# Patient Record
Sex: Female | Born: 1995 | Race: Black or African American | Hispanic: No | Marital: Married | Smoking: Never smoker
Health system: Southern US, Community
[De-identification: ages and names within clinical notes are randomized; demographics above are authoritative.]

## PROBLEM LIST (undated history)

## (undated) ENCOUNTER — Inpatient Hospital Stay (HOSPITAL_COMMUNITY): Payer: Self-pay

## (undated) DIAGNOSIS — A599 Trichomoniasis, unspecified: Secondary | ICD-10-CM

## (undated) DIAGNOSIS — D649 Anemia, unspecified: Secondary | ICD-10-CM

## (undated) DIAGNOSIS — K219 Gastro-esophageal reflux disease without esophagitis: Secondary | ICD-10-CM

## (undated) HISTORY — PX: NO PAST SURGERIES: SHX2092

---
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
## 2017-01-04 ENCOUNTER — Encounter (HOSPITAL_COMMUNITY): Payer: Self-pay | Admitting: Family Medicine

## 2017-01-04 ENCOUNTER — Ambulatory Visit (HOSPITAL_COMMUNITY)
Admission: EM | Admit: 2017-01-04 | Discharge: 2017-01-04 | Disposition: A | Discharge status: Home / Self Care | Payer: Medicaid Other | Attending: Internal Medicine | Admitting: Internal Medicine

## 2017-01-04 DIAGNOSIS — Z3202 Encounter for pregnancy test, result negative: Secondary | ICD-10-CM

## 2017-01-04 DIAGNOSIS — R4589 Other symptoms and signs involving emotional state: Secondary | ICD-10-CM

## 2017-01-04 DIAGNOSIS — F489 Nonpsychotic mental disorder, unspecified: Secondary | ICD-10-CM

## 2017-01-04 LAB — POCT PREGNANCY, URINE: Preg Test, Ur: NEGATIVE

## 2017-01-04 NOTE — ED Provider Notes (Signed)
CSN: [id number]     Arrival date & time 01/04/17  1322 History   None    Chief Complaint  Patient presents with  . Possible Pregnancy   (Consider location/radiation/quality/duration/timing/severity/associated sxs/prior Treatment) 22 year old female returns to the urgent care 7 days after she had presented to the urgent care for her pregnancy test. She is requesting another pregnancy test. The first one was negative. The chief complaint was moodiness according to her significant other. The patient talks very little. Her significant other encouraged her to be seen again in the urgent care. No missed periods. LMP 12/17/2016. No bleeding or discharge. No urinary symptoms.      History reviewed. No pertinent past medical history. History reviewed. No pertinent surgical history. History reviewed. No pertinent family history. Social History  Substance Use Topics  . Smoking status: Never Smoker  . Smokeless tobacco: Never Used  . Alcohol use No   OB History    No data available     Review of Systems  Constitutional: Negative.   Respiratory: Negative.   Gastrointestinal: Negative.   Genitourinary: Negative for dysuria, frequency, urgency, vaginal bleeding and vaginal discharge.  Musculoskeletal: Negative.   Neurological: Positive for dizziness.  Psychiatric/Behavioral: Positive for dysphoric mood.  All other systems reviewed and are negative.   Allergies  Patient has no known allergies.  Home Medications   Prior to Admission medications   Medication Sig Start Date End Date Taking? Authorizing Provider  omeprazole (PRILOSEC) 20 MG capsule Take 20 mg by mouth daily.    Historical Provider, MD   Meds Ordered and Administered this Visit  Medications - No data to display  BP (!) 112/59   Pulse (!) 111   Temp 98.3 F (36.8 C)   Resp 18   LMP 12/19/2016 (Exact Date)   SpO2 100%  No data found.   Physical Exam  Constitutional: She is oriented to person, place, and time. She  appears well-developed and well-nourished. No distress.  HENT:  Right Ear: External ear normal.  Left Ear: External ear normal.  Posterior pharynx with minimal erythema, light cobblestoning and scant clear PND.  Eyes: EOM are normal.  Neck: Normal range of motion. Neck supple.  Cardiovascular: Normal rate, regular rhythm, normal heart sounds and intact distal pulses.   Pulmonary/Chest: Effort normal and breath sounds normal. No respiratory distress. She has no wheezes. She has no rales.  Abdominal: Soft. She exhibits no distension and no mass. There is no tenderness. There is no rebound and no guarding.  Musculoskeletal: She exhibits no edema.  Lymphadenopathy:    She has no cervical adenopathy.  Neurological: She is alert and oriented to person, place, and time. She exhibits normal muscle tone.  Skin: Skin is warm and dry.  Psychiatric: She has a normal mood and affect.  Nursing note and vitals reviewed.   Urgent Care Course     Procedures (including critical care time)  Labs Review Labs Reviewed  POCT PREGNANCY, URINE    Imaging Review No results found.   Visual Acuity Review  Right Eye Distance:   Left Eye Distance:   Bilateral Distance:    Right Eye Near:   Left Eye Near:    Bilateral Near:         MDM   1. Moodiness   2. Negative pregnancy test    You pregnancy test is negative. If you are having any type of mood disorder you must follow-up with your primary care doctor. If he do not have one  call the telephone numbers listed in your instruction sheets. You may also follow-up with behavioral health clinic in [place] associated with [place]. There also, may be a physical disorder requiring lab work that we do not have here. There is very little the urgent care can do in regards to potential mood disorders and advance lab work. Strongly recommend that you obtain a new primary care provider or see the one that is listed above.    Hayden Rasmussen,  NP 01/04/17 1423

## 2017-01-04 NOTE — ED Triage Notes (Signed)
Pt here for preg test.

## 2017-01-04 NOTE — Discharge Instructions (Signed)
You pregnancy test is negative. If you are having any type of mood disorder you must follow-up with your primary care doctor. If he do not have one call the telephone numbers listed in your instruction sheets. You may also follow-up with behavioral health clinic in [place] associated with [place]. There also, may be a physical disorder requiring lab work that we do not have here. There is very little the urgent care can do in regards to potential mood disorders and advance lab work. Strongly recommend that you obtain a new primary care provider or see the one that is listed above.

## 2017-01-11 ENCOUNTER — Encounter (HOSPITAL_COMMUNITY): Payer: Self-pay | Admitting: Emergency Medicine

## 2017-01-11 ENCOUNTER — Ambulatory Visit (HOSPITAL_COMMUNITY)
Admission: EM | Admit: 2017-01-11 | Discharge: 2017-01-11 | Disposition: A | Discharge status: Home / Self Care | Payer: Medicaid Other | Attending: Family Medicine | Admitting: Family Medicine

## 2017-01-11 DIAGNOSIS — N946 Dysmenorrhea, unspecified: Secondary | ICD-10-CM

## 2017-01-11 DIAGNOSIS — K2100 Gastro-esophageal reflux disease with esophagitis, without bleeding: Secondary | ICD-10-CM

## 2017-01-11 MED ORDER — NAPROXEN 500 MG PO TABS: ORAL_TABLET | ORAL | 0 refills | Status: DC

## 2017-01-11 MED ORDER — OMEPRAZOLE 20 MG PO CPDR: 20.0000 mg | DELAYED_RELEASE_CAPSULE | Freq: Every day | ORAL | 0 refills | Status: DC

## 2017-01-11 NOTE — ED Triage Notes (Signed)
Pt reports lower abdominal pain that started yesterday.

## 2017-01-11 NOTE — ED Provider Notes (Signed)
CSN: [id number]     Arrival date & time 01/11/17  1939 History   First MD Initiated Contact with Patient 01/11/17 1950     Chief Complaint  Patient presents with  . Abdominal Pain   (Consider location/radiation/quality/duration/timing/severity/associated sxs/prior Treatment) Patient c/o menstrual cramping since starting her menses.  She would like refill on prilosec for GERD.   The history is provided by the patient.  Abdominal Pain  Pain location:  Generalized Pain quality: aching   Pain radiates to:  Does not radiate   History reviewed. No pertinent past medical history. History reviewed. No pertinent surgical history. History reviewed. No pertinent family history. Social History  Substance Use Topics  . Smoking status: Never Smoker  . Smokeless tobacco: Never Used  . Alcohol use No   OB History    No data available     Review of Systems  Constitutional: Negative.   HENT: Negative.   Eyes: Negative.   Respiratory: Negative.   Cardiovascular: Negative.   Gastrointestinal: Positive for abdominal pain.  Endocrine: Negative.   Genitourinary: Positive for menstrual problem.  Musculoskeletal: Negative.   Allergic/Immunologic: Negative for immunocompromised state.  Neurological: Negative.   Hematological: Negative.   Psychiatric/Behavioral: Negative.     Allergies  Patient has no known allergies.  Home Medications   Prior to Admission medications   Medication Sig Start Date End Date Taking? Authorizing Provider  naproxen (NAPROSYN) 500 MG tablet Take one po bid x 5 days and start 2 days prior to menses. 01/11/17   Deatra Canter, FNP  omeprazole (PRILOSEC) 20 MG capsule Take 1 capsule (20 mg total) by mouth daily. 01/11/17   Deatra Canter, FNP   Meds Ordered and Administered this Visit  Medications - No data to display  BP 124/76 (BP Location: Right Arm)   Pulse 97   Temp 98.2 F (36.8 C) (Oral)   LMP 01/10/2017   SpO2 98%  No data found.   Physical  Exam  Constitutional: She is oriented to person, place, and time. She appears well-developed and well-nourished.  HENT:  Head: Normocephalic.  Right Ear: External ear normal.  Left Ear: External ear normal.  Mouth/Throat: Oropharynx is clear and moist.  Eyes: Conjunctivae and EOM are normal. Pupils are equal, round, and reactive to light.  Neck: Normal range of motion. Neck supple.  Cardiovascular: Normal rate, regular rhythm and normal heart sounds.   Pulmonary/Chest: Effort normal and breath sounds normal.  Abdominal: Soft. Bowel sounds are normal.  Musculoskeletal: Normal range of motion.  Neurological: She is alert and oriented to person, place, and time.  Nursing note and vitals reviewed.   Urgent Care Course     Procedures (including critical care time)  Labs Review Labs Reviewed - No data to display  Imaging Review No results found.   Visual Acuity Review  Right Eye Distance:   Left Eye Distance:   Bilateral Distance:    Right Eye Near:   Left Eye Near:    Bilateral Near:         MDM   1. Dysmenorrhea   2. Gastroesophageal reflux disease with esophagitis    Prilosec  one po qd #30 Naprosyn  one po bid x 5 days #30      Deatra Canter, FNP 01/11/17 2025

## 2017-01-19 ENCOUNTER — Emergency Department (HOSPITAL_COMMUNITY)
Admission: EM | Admit: 2017-01-19 | Discharge: 2017-01-19 | Disposition: A | Discharge status: Home / Self Care | Payer: Medicaid Other | Attending: Emergency Medicine | Admitting: Emergency Medicine

## 2017-01-19 ENCOUNTER — Encounter (HOSPITAL_COMMUNITY): Payer: Self-pay

## 2017-01-19 DIAGNOSIS — F17200 Nicotine dependence, unspecified, uncomplicated: Secondary | ICD-10-CM | POA: Diagnosis not present

## 2017-01-19 DIAGNOSIS — N390 Urinary tract infection, site not specified: Secondary | ICD-10-CM | POA: Diagnosis present

## 2017-01-19 DIAGNOSIS — R300 Dysuria: Secondary | ICD-10-CM

## 2017-01-19 DIAGNOSIS — N940 Mittelschmerz: Secondary | ICD-10-CM | POA: Diagnosis not present

## 2017-01-19 LAB — URINALYSIS, ROUTINE W REFLEX MICROSCOPIC
BILIRUBIN URINE: NEGATIVE
Bacteria, UA: NONE SEEN
GLUCOSE, UA: NEGATIVE mg/dL
Ketones, ur: NEGATIVE mg/dL
NITRITE: NEGATIVE
PH: 7 (ref 5.0–8.0)
Protein, ur: 30 mg/dL — AB
Specific Gravity, Urine: 1.017 (ref 1.005–1.030)

## 2017-01-19 LAB — WET PREP, GENITAL
Clue Cells Wet Prep HPF POC: NONE SEEN
SPERM: NONE SEEN
TRICH WET PREP: NONE SEEN
Yeast Wet Prep HPF POC: NONE SEEN

## 2017-01-19 LAB — POC URINE PREG, ED: Preg Test, Ur: NEGATIVE

## 2017-01-19 MED ORDER — IBUPROFEN 600 MG PO TABS: 600.0000 mg | ORAL_TABLET | Freq: Four times a day (QID) | ORAL | 0 refills | Status: DC | PRN

## 2017-01-19 MED ORDER — PHENAZOPYRIDINE HCL 200 MG PO TABS: 200.0000 mg | ORAL_TABLET | Freq: Three times a day (TID) | ORAL | 0 refills | Status: DC

## 2017-01-19 NOTE — ED Notes (Addendum)
Dondra Spry, NP at bedside to perfrom Pelvic exam

## 2017-01-19 NOTE — Discharge Instructions (Signed)
Today your examination is normal.  I feel that you are having mittelschmerz name U been given a perception for ibuprofen as well as Pyridium, which will help the discomfort when you urinate.  Please make an appointment with your doctor for follow-up

## 2017-01-19 NOTE — ED Notes (Signed)
Pt requesting preg test with blood draw. Rn asked if pt thinks she is pregnant and pt sts she doesn't know. LMP 01/10/17

## 2017-01-19 NOTE — ED Triage Notes (Signed)
Pt states she believes she has an UTI; pt states new onset today of right lower back pain, burning sensation on urination and  Increased frequency noted; pt states pain 7/10 on arrival. Pt a&ox 4

## 2017-01-19 NOTE — ED Provider Notes (Signed)
[facility]-EMERGENCY DEPT Provider Note   CSN: [id number] Arrival date & time: 01/19/17  1912     History   Chief Complaint Chief Complaint  Patient presents with  . Urinary Tract Infection    HPI Julie [name] is a 21 y.o. female.  This is an 21 year old female who reports, dysuria and right low back pain since this morning.  She's not taken any over-the-counter medication.  She reports that her last menstrual period was 10 days ago, normal.      History reviewed. No pertinent past medical history.  There are no active problems to display for this patient.   History reviewed. No pertinent surgical history.  OB History    No data available       Home Medications    Prior to Admission medications   Medication Sig Start Date End Date Taking? Authorizing Provider  ibuprofen (ADVIL,MOTRIN) 600 MG tablet Take 1 tablet (600 mg total) by mouth every 6 (six) hours as needed. 01/19/17   Earley Favor, NP  naproxen (NAPROSYN) 500 MG tablet Take one po bid x 5 days and start 2 days prior to menses. 01/11/17   Deatra Canter, FNP  omeprazole (PRILOSEC) 20 MG capsule Take 1 capsule (20 mg total) by mouth daily. 01/11/17   Deatra Canter, FNP  phenazopyridine (PYRIDIUM) 200 MG tablet Take 1 tablet (200 mg total) by mouth 3 (three) times daily. 01/19/17   Earley Favor, NP    Family History History reviewed. No pertinent family history.  Social History Social History  Substance Use Topics  . Smoking status: Current Some Day Smoker  . Smokeless tobacco: Never Used  . Alcohol use No     Allergies   Patient has no known allergies.   Review of Systems Review of Systems  Constitutional: Negative for chills and fever.  Gastrointestinal: Negative for abdominal pain.  Genitourinary: Positive for dysuria. Negative for decreased urine volume, frequency, menstrual problem, vaginal bleeding, vaginal discharge and vaginal pain.  Musculoskeletal: Positive for back pain.  Skin:  Negative for wound.  All other systems reviewed and are negative.    Physical Exam Updated Vital Signs BP 103/85   Pulse 73   Temp 97.8 F (36.6 C) (Oral)   Resp 16   Ht  (1.651 m)   Wt 52.2 kg   LMP 01/10/2017   SpO2 100%   BMI 19.14 kg/m   Physical Exam  Constitutional: She appears well-developed and well-nourished.  HENT:  Head: Normocephalic.  Eyes: Pupils are equal, round, and reactive to light.  Neck: Normal range of motion.  Cardiovascular: Normal rate.   Pulmonary/Chest: Effort normal.  Abdominal: Soft. She exhibits no distension. There is no tenderness.  Genitourinary: Vagina normal and uterus normal. Cervix exhibits no discharge. Right adnexum displays no mass, no tenderness and no fullness. Left adnexum displays no mass, no tenderness and no fullness. No vaginal discharge found.  Neurological: She is alert.  Skin: Skin is warm.  Psychiatric: She has a normal mood and affect.  Nursing note and vitals reviewed.    ED Treatments / Results  Labs (all labs ordered are listed, but only abnormal results are displayed) Labs Reviewed  WET PREP, GENITAL - Abnormal; Notable for the following:       Result Value   WBC, Wet Prep HPF POC FEW (*)    All other components within normal limits  URINALYSIS, ROUTINE W REFLEX MICROSCOPIC - Abnormal; Notable for the following:    APPearance HAZY (*)  Hgb urine dipstick MODERATE (*)    Protein, ur 30 (*)    Leukocytes, UA MODERATE (*)    Squamous Epithelial / LPF 0-5 (*)    Non Squamous Epithelial 0-5 (*)    All other components within normal limits  POC URINE PREG, ED  GC/CHLAMYDIA PROBE AMP ([place]) NOT AT [facility]    EKG  EKG Interpretation None       Radiology No results found.  Procedures Procedures (including critical care time)  Medications Ordered in ED Medications - No data to display   Initial Impression / Assessment and Plan / ED Course  I have reviewed the triage vital signs and the  nursing notes.  Pertinent labs & imaging results that were available during my care of the patient were reviewed by me and considered in my medical decision making (see chart for details).      Is negative.  Pelvic exam is benign.  This patient has mittelschmerz.  She'll be given ibuprofen with [facility]  Final Clinical Impressions(s) / ED Diagnoses   Final diagnoses:  Dysuria  Mittelschmerz    New Prescriptions Discharge Medication List as of 01/19/2017 10:21 PM    START taking these medications   Details  ibuprofen (ADVIL,MOTRIN) 600 MG tablet Take 1 tablet (600 mg total) by mouth every 6 (six) hours as needed., Starting Thu 01/19/2017, Print    phenazopyridine (PYRIDIUM) 200 MG tablet Take 1 tablet (200 mg total) by mouth 3 (three) times daily., Starting Thu 01/19/2017, Print         Earley Favor, NP 01/20/17 1610    Bethann Berkshire, MD 01/24/17 2141

## 2017-01-20 LAB — GC/CHLAMYDIA PROBE AMP (~~LOC~~) NOT AT ARMC
Chlamydia: NEGATIVE
Neisseria Gonorrhea: NEGATIVE

## 2017-03-02 ENCOUNTER — Encounter (HOSPITAL_COMMUNITY): Payer: Self-pay | Admitting: Emergency Medicine

## 2017-03-02 ENCOUNTER — Ambulatory Visit (HOSPITAL_COMMUNITY)
Admission: EM | Admit: 2017-03-02 | Discharge: 2017-03-02 | Disposition: A | Discharge status: Home / Self Care | Payer: Medicaid Other | Attending: Internal Medicine | Admitting: Internal Medicine

## 2017-03-02 DIAGNOSIS — O26851 Spotting complicating pregnancy, first trimester: Secondary | ICD-10-CM | POA: Diagnosis not present

## 2017-03-02 DIAGNOSIS — Z3A01 Less than 8 weeks gestation of pregnancy: Secondary | ICD-10-CM

## 2017-03-02 DIAGNOSIS — Z0289 Encounter for other administrative examinations: Secondary | ICD-10-CM | POA: Diagnosis not present

## 2017-03-02 NOTE — ED Triage Notes (Signed)
Abdominal pain onset 2-3 days ago, pain is worse in the morning.  Reports nausea and vomiting intermittently in the morning.    Patient is pregnant as confirmed by the ob/gyn.  Patient saw them last week.  Patient is 7 weeks, 2 days pregnant.    Patient reports spotting today

## 2017-03-02 NOTE — Discharge Instructions (Signed)
If at anytime you have abdominal pain, cramping, or discomfort, or if your spotting returns, [facility]. Follow up with your gynecologist/OB for discussions about work restrictions.

## 2017-03-02 NOTE — ED Provider Notes (Signed)
CSN: [id number]     Arrival date & time 03/02/17  1835 History   First MD Initiated Contact with Patient 03/02/17 1912     Chief Complaint  Patient presents with  . Abdominal Pain   (Consider location/radiation/quality/duration/timing/severity/associated sxs/prior Treatment) 21 year old female presents to clinic for a chief complaint of having some vaginal spotting, and abdominal pain and cramping. She denies having either abdominal pain, or spotting currently at the time of assessment. She states that she has woken up each morning for the last 2-3 days with these symptoms, never resolved throughout the day. She is [redacted] weeks pregnant, does not have an OB yet, however does have an appointment coming up to establish with an OB. She has no nausea, no vomiting, fatigue, or other symptoms, and she does not appear to be in acute distress. This is her second pregnancy, however the first resulted in a spontaneous miscarriage. She is also wanting a work note, to restrict her to less than 34 hours per week of employment.   The history is provided by the patient.    History reviewed. No pertinent past medical history. History reviewed. No pertinent surgical history. No family history on file. Social History  Substance Use Topics  . Smoking status: Former Games developer  . Smokeless tobacco: Never Used  . Alcohol use No   OB History    Gravida Para Term Preterm AB Living   1             SAB TAB Ectopic Multiple Live Births                 Review of Systems  Constitutional: Negative.   HENT: Negative.   Respiratory: Negative.   Cardiovascular: Negative.   Gastrointestinal: Negative.   Genitourinary: Negative.   Musculoskeletal: Negative.   Skin: Negative.   Neurological: Negative.     Allergies  Patient has no known allergies.  Home Medications   Prior to Admission medications   Medication Sig Start Date End Date Taking? Authorizing Provider  ibuprofen (ADVIL,MOTRIN) 600 MG tablet Take 1  tablet (600 mg total) by mouth every 6 (six) hours as needed. 01/19/17   Earley Favor, NP  naproxen (NAPROSYN) 500 MG tablet Take one po bid x 5 days and start 2 days prior to menses. 01/11/17   Deatra Canter, FNP  omeprazole (PRILOSEC) 20 MG capsule Take 1 capsule (20 mg total) by mouth daily. 01/11/17   Deatra Canter, FNP  phenazopyridine (PYRIDIUM) 200 MG tablet Take 1 tablet (200 mg total) by mouth 3 (three) times daily. 01/19/17   Earley Favor, NP   Meds Ordered and Administered this Visit  Medications - No data to display  BP 102/62   Pulse 98   Temp 98.4 F (36.9 C) (Oral)   Resp 18   LMP 01/10/2017   SpO2 100%  No data found.   Physical Exam  Constitutional: She is oriented to person, place, and time. She appears well-developed and well-nourished. No distress.  HENT:  Head: Normocephalic and atraumatic.  Right Ear: External ear normal.  Left Ear: External ear normal.  Eyes: Conjunctivae are normal.  Neck: Normal range of motion.  Cardiovascular: Normal rate and regular rhythm.   Pulmonary/Chest: Effort normal and breath sounds normal.  Abdominal: Soft. Bowel sounds are normal. She exhibits no distension. There is no tenderness. There is no rebound and no guarding.  Neurological: She is alert and oriented to person, place, and time.  Skin: Skin is warm and dry.  Capillary refill takes less than 2 seconds. No rash noted. She is not diaphoretic. No erythema.  Psychiatric: She has a normal mood and affect. Her behavior is normal.  Nursing note and vitals reviewed.   Urgent Care Course     Procedures (including critical care time)  Labs Review Labs Reviewed - No data to display  Imaging Review No results found.           MDM   1. Encounter to obtain excuse from work    Patient denies any complaints at the time of assessment, physical exam findings are unremarkable, strict follow-up guidelines were provided, for the patient any time she has vaginal  bleeding, or abdominal pain, to [facility]. Patient also encouraged to follow up, and establish with her OB, and any discussions regarding work restrictions should be undertaken by her OB. Follow-up as needed.     Dorena BodoKennard, Mikayela Deats, NP 03/02/17 Corky Crafts1955

## 2017-03-29 ENCOUNTER — Encounter: Payer: Medicaid Other | Admitting: Obstetrics and Gynecology

## 2017-04-17 ENCOUNTER — Encounter: Payer: Self-pay | Admitting: Obstetrics & Gynecology

## 2017-04-17 ENCOUNTER — Ambulatory Visit (INDEPENDENT_AMBULATORY_CARE_PROVIDER_SITE_OTHER): Payer: Medicaid Other | Admitting: Obstetrics & Gynecology

## 2017-04-17 ENCOUNTER — Other Ambulatory Visit (HOSPITAL_COMMUNITY)
Admission: RE | Admit: 2017-04-17 | Discharge: 2017-04-17 | Disposition: A | Discharge status: Home / Self Care | Payer: Medicaid Other | Source: Ambulatory Visit | Attending: Obstetrics and Gynecology | Admitting: Obstetrics and Gynecology

## 2017-04-17 VITALS — BP 117/74 | HR 112 | Wt 108.1 lb

## 2017-04-17 DIAGNOSIS — Z3400 Encounter for supervision of normal first pregnancy, unspecified trimester: Secondary | ICD-10-CM | POA: Insufficient documentation

## 2017-04-17 DIAGNOSIS — Z3401 Encounter for supervision of normal first pregnancy, first trimester: Secondary | ICD-10-CM | POA: Diagnosis present

## 2017-04-17 DIAGNOSIS — Z3689 Encounter for other specified antenatal screening: Secondary | ICD-10-CM

## 2017-04-17 DIAGNOSIS — O2341 Unspecified infection of urinary tract in pregnancy, first trimester: Secondary | ICD-10-CM

## 2017-04-17 NOTE — Patient Instructions (Signed)

## 2017-04-17 NOTE — Progress Notes (Signed)
     Subjective:   Julie [name] is a 21 y.o. G1P0 at 2271w6d by LMP being seen today for her first obstetrical visit.  Her obstetrical history is significant for teenage pregnancy. Patient does intend to breast feed. Pregnancy history fully reviewed.  Patient reports no complaints.  HISTORY: Obstetric History   G1   P0   T0   P0   A0   L0    SAB0   TAB0   Ectopic0   Multiple0   Live Births0     # Outcome Date GA Lbr Len/2nd Weight Sex Delivery Anes PTL Lv  1 Current              History reviewed. No pertinent past medical history. History reviewed. No pertinent surgical history. Family History  Problem Relation Age of Onset  . Diabetes Mother   . Pancreatic cancer Father   . Cancer Maternal Grandfather    Social History  Substance Use Topics  . Smoking status: Former Games developermoker  . Smokeless tobacco: Never Used  . Alcohol use No   No Known Allergies No current outpatient prescriptions on file prior to visit.   No current facility-administered medications on file prior to visit.      Exam   Vitals:   04/17/17 0925  BP: 117/74  Pulse: (!) 112  Weight: 108 lb 1.6 oz (49 kg)   Fetal Heart Rate (bpm): 156  Uterus:     Pelvic Exam: Deferred   System: General: well-developed, well-nourished female in no acute distress   Breast:  normal appearance, no masses or tenderness   Skin: normal coloration and turgor, no rashes   Neurologic: oriented, normal, negative, normal mood   Extremities: normal strength, tone, and muscle mass, ROM of all joints is normal   HEENT PERRLA, extraocular movement intact and sclera clear, anicteric   Mouth/Teeth mucous membranes moist, pharynx normal without lesions and dental hygiene good   Neck supple and no masses   Cardiovascular: regular rate and rhythm   Respiratory:  no respiratory distress, normal breath sounds   Abdomen: soft, non-tender; bowel sounds normal; no masses,  no organomegaly     Assessment:   Pregnancy: G1P0 Patient  Active Problem List   Diagnosis Date Noted  . Encounter for supervision of normal first pregnancy, unspecified trimester 04/17/2017     Plan:  1. Encounter for fetal anatomic survey Anatomy scan ordered - US MFM OB COMP + 14 WK; Future  2. Encounter for supervision of normal first pregnancy in first trimester Labs done including NIPS - Hemoglobinopathy evaluation - Obstetric Panel, Including HIV - Cystic Fibrosis Mutation 97 - SMN1 Copy Number Analysis - Culture, OB Urine - Urine cytology ancillary only - MaterniT21  plus Core+ESS+SCA, Blood Continue prenatal vitamins. Genetic Screening discussed, NIPS: ordered. Ultrasound discussed; fetal anatomic survey: ordered. Problem list reviewed and updated. Patient will be enrolled in [facility] The nature of [facility] with multiple MDs and other Advanced Practice Providers was explained to patient; also emphasized that residents, students are part of our team. Routine obstetric precautions reviewed. Return in about 6 weeks (around 05/29/2017) for OB 20 week visit ([facility]).     Jaynie CollinsUGONNA  [name], MD, FACOG Attending Obstetrician & Gynecologist, [facility], [facility]

## 2017-04-18 LAB — URINE CYTOLOGY ANCILLARY ONLY
Chlamydia: NEGATIVE
NEISSERIA GONORRHEA: NEGATIVE
TRICH (WINDOWPATH): NEGATIVE

## 2017-04-20 ENCOUNTER — Encounter: Payer: Self-pay | Admitting: *Deleted

## 2017-04-20 LAB — CULTURE, OB URINE

## 2017-04-20 LAB — URINE CULTURE, OB REFLEX

## 2017-04-20 MED ORDER — NITROFURANTOIN MONOHYD MACRO 100 MG PO CAPS: 100.0000 mg | ORAL_CAPSULE | Freq: Two times a day (BID) | ORAL | 1 refills | Status: DC

## 2017-04-20 NOTE — Addendum Note (Signed)
Addended by: Jaynie CollinsANYANWU, Kingstin Heims A on: 04/20/2017 04:54 PM   Modules accepted: Orders

## 2017-04-22 LAB — MATERNIT21  PLUS CORE+ESS+SCA, BLOOD
CHROMOSOME 13: NEGATIVE
CHROMOSOME 21: NEGATIVE
Chromosome 18: NEGATIVE
Y CHROMOSOME: NOT DETECTED

## 2017-04-26 ENCOUNTER — Encounter: Payer: Self-pay | Admitting: Obstetrics & Gynecology

## 2017-04-26 DIAGNOSIS — O361990 Maternal care for other isoimmunization, unspecified trimester, not applicable or unspecified: Secondary | ICD-10-CM | POA: Insufficient documentation

## 2017-04-26 LAB — SMN1 COPY NUMBER ANALYSIS (SMA CARRIER SCREENING)

## 2017-04-26 LAB — HEMOGLOBINOPATHY EVALUATION
HGB A: 96.2 % — AB (ref 96.4–98.8)
HGB C: 0 %
HGB S: 0 %
HGB VARIANT: 0 %
Hemoglobin A2 Quantitation: 2.9 % (ref 1.8–3.2)
Hemoglobin F Quantitation: 0.9 % (ref 0.0–2.0)

## 2017-04-26 LAB — OBSTETRIC PANEL, INCLUDING HIV
BASOS ABS: 0 10*3/uL (ref 0.0–0.2)
BASOS: 0 %
EOS (ABSOLUTE): 0 10*3/uL (ref 0.0–0.4)
Eos: 0 %
HIV SCREEN 4TH GENERATION: NONREACTIVE
Hematocrit: 32.9 % — ABNORMAL LOW (ref 34.0–46.6)
Hemoglobin: 10.9 g/dL — ABNORMAL LOW (ref 11.1–15.9)
Hepatitis B Surface Ag: NEGATIVE
Immature Grans (Abs): 0 10*3/uL (ref 0.0–0.1)
Immature Granulocytes: 0 %
LYMPHS ABS: 1.3 10*3/uL (ref 0.7–3.1)
Lymphs: 23 %
MCH: 29.5 pg (ref 26.6–33.0)
MCHC: 33.1 g/dL (ref 31.5–35.7)
MCV: 89 fL (ref 79–97)
MONOCYTES: 8 %
MONOS ABS: 0.5 10*3/uL (ref 0.1–0.9)
NEUTROS ABS: 3.9 10*3/uL (ref 1.4–7.0)
Neutrophils: 69 %
Platelets: 279 10*3/uL (ref 150–379)
RBC: 3.7 x10E6/uL — ABNORMAL LOW (ref 3.77–5.28)
RDW: 16 % — AB (ref 12.3–15.4)
RPR Ser Ql: NONREACTIVE
RUBELLA: 3.82 {index} (ref >0.99)
Rh Factor: POSITIVE
WBC: 5.7 10*3/uL (ref 3.4–10.8)

## 2017-04-26 LAB — CYSTIC FIBROSIS MUTATION 97: GENE DIS ANAL CARRIER INTERP BLD/T-IMP: NOT DETECTED

## 2017-04-26 LAB — AB SCR+ANTIBODY ID: ANTIBODY SCREEN: POSITIVE — AB

## 2017-05-20 ENCOUNTER — Encounter (HOSPITAL_COMMUNITY): Payer: Self-pay | Admitting: *Deleted

## 2017-05-20 ENCOUNTER — Inpatient Hospital Stay (HOSPITAL_COMMUNITY)
Admission: AD | Admit: 2017-05-20 | Discharge: 2017-05-20 | Disposition: A | Discharge status: Home / Self Care | Payer: Medicaid Other | Source: Ambulatory Visit | Attending: Obstetrics and Gynecology | Admitting: Obstetrics and Gynecology

## 2017-05-20 DIAGNOSIS — Z3A18 18 weeks gestation of pregnancy: Secondary | ICD-10-CM | POA: Diagnosis not present

## 2017-05-20 DIAGNOSIS — Z87891 Personal history of nicotine dependence: Secondary | ICD-10-CM | POA: Diagnosis not present

## 2017-05-20 DIAGNOSIS — O26892 Other specified pregnancy related conditions, second trimester: Secondary | ICD-10-CM | POA: Insufficient documentation

## 2017-05-20 DIAGNOSIS — R109 Unspecified abdominal pain: Secondary | ICD-10-CM | POA: Diagnosis present

## 2017-05-20 HISTORY — DX: Gastro-esophageal reflux disease without esophagitis: K21.9

## 2017-05-20 LAB — URINALYSIS, ROUTINE W REFLEX MICROSCOPIC
BILIRUBIN URINE: NEGATIVE
GLUCOSE, UA: 50 mg/dL — AB
HGB URINE DIPSTICK: NEGATIVE
Ketones, ur: NEGATIVE mg/dL
NITRITE: NEGATIVE
Protein, ur: NEGATIVE mg/dL
RBC / HPF: NONE SEEN RBC/hpf (ref 0–5)
Specific Gravity, Urine: 1.015 (ref 1.005–1.030)
pH: 5 (ref 5.0–8.0)

## 2017-05-20 MED ORDER — PRENATAL PLUS 27-1 MG PO TABS: 1.0000 | ORAL_TABLET | Freq: Every day | ORAL | 0 refills | Status: DC

## 2017-05-20 NOTE — MAU Note (Signed)
Onset of sharp pain in abdomen radiating to back since this morning, denies vaginal bleeding, denies vaginal discharge, denies dysuria, no constipation.

## 2017-05-20 NOTE — MAU Provider Note (Signed)
History     CSN: [id number]  Arrival date and time: 05/20/17 7672   First Provider Initiated Contact with Patient 05/20/17 1902      Chief Complaint  Patient presents with  . Abdominal Pain  . Back Pain   Julie [name] is a 21 y.o. G1P0 at [redacted]w[redacted]d with onset intermittent abdominal pain which woke her from sleep at 2 AM. She describes sudden onset of sharp pulling pain located across left and right abdomen at level of umbilicus. It radiates to her low back. It has occurred 3 times each lasting approximately 10 minutes. No lower abdominal cramping. Denies any pain at present. Not related to fetal movement or her physician. Sitting up alleviates the pain. Nothing exacerbates it. No similar previous episodes. No trauma, strong exertion or strain. No urinary symptoms, leakage of fluid or vaginal bleeding. No sexual activity since pregnancy diagnosed. Prenatal care at [facility] has been essentially uncomplicated.     OB History  Gravida Para Term Preterm AB Living  1            SAB TAB Ectopic Multiple Live Births               # Outcome Date GA Lbr Len/2nd Weight Sex Delivery Anes PTL Lv  1 Current                Past Medical History:  Diagnosis Date  . GERD (gastroesophageal reflux disease)     Past Surgical History:  Procedure Laterality Date  . NO PAST SURGERIES      Family History  Problem Relation Age of Onset  . Diabetes Mother   . Pancreatic cancer Father   . Cancer Maternal Grandfather     Social History  Substance Use Topics  . Smoking status: Former Games developer  . Smokeless tobacco: Never Used  . Alcohol use No    Allergies: No Known Allergies  Prescriptions Prior to Admission  Medication Sig Dispense Refill Last Dose  . nitrofurantoin, macrocrystal-monohydrate, (MACROBID) 100 MG capsule Take 1 capsule (100 mg total) by mouth 2 (two) times daily. 14 capsule 1     Review of Systems  Constitutional: Negative for fever.  Respiratory: Negative for shortness  of breath.   Gastrointestinal: Negative for constipation, diarrhea, nausea, rectal pain and vomiting.  Genitourinary: Negative for difficulty urinating, dysuria, flank pain, frequency, hematuria, urgency, vaginal bleeding and vaginal discharge.  Musculoskeletal: Negative for back pain.  Neurological: Negative for dizziness.  Psychiatric/Behavioral: Negative for agitation. The patient is not nervous/anxious.    Physical Exam   Blood pressure 116/76, pulse 97, temperature 98.4 F (36.9 C), resp. rate 16, last menstrual period 01/10/2017. DT FHR 148  Physical Exam  Nursing note and vitals reviewed. Constitutional: She appears well-developed and well-nourished. No distress.  HENT:  Head: Normocephalic.  Eyes: Pupils are equal, round, and reactive to light.  Neck: No thyromegaly present.  Cardiovascular: Normal rate.   Respiratory: Effort normal.  GI: Soft. There is no tenderness.  Fundus at lower umbilicus  Genitourinary:  Genitourinary Comments: Vaginal exam: Cervix posterior long, closed, presenting part high  Musculoskeletal: Normal range of motion.  No paraspinous tenderness or CVAT  Neurological: She is alert.  Skin: Skin is warm and dry.  Psychiatric: She has a normal mood and affect. Her behavior is normal.    MAU Course  Procedures Results for orders placed or performed during the hospital encounter of 05/20/17 (from the past 24 hour(s))  Urinalysis, Routine w reflex microscopic  Status: Abnormal   Collection Time: 05/20/17  6:45 PM  Result Value Ref Range   Color, Urine YELLOW YELLOW   APPearance HAZY (A) CLEAR   Specific Gravity, Urine 1.015 1.005 - 1.030   pH 5.0 5.0 - 8.0   Glucose, UA 50 (A) NEGATIVE mg/dL   Hgb urine dipstick NEGATIVE NEGATIVE   Bilirubin Urine NEGATIVE NEGATIVE   Ketones, ur NEGATIVE NEGATIVE mg/dL   Protein, ur NEGATIVE NEGATIVE mg/dL   Nitrite NEGATIVE NEGATIVE   Leukocytes, UA TRACE (A) NEGATIVE   RBC / HPF NONE SEEN 0 - 5 RBC/hpf    WBC, UA 6-30 0 - 5 WBC/hpf   Bacteria, UA RARE (A) NONE SEEN   Squamous Epithelial / LPF 0-5 (A) NONE SEEN   Mucous PRESENT   Urine culture sent   Assessment and Plan   1. Abdominal pain in pregnancy, second trimester  Transient pains, likely musculo-skeletal origin  . Return for increase in symptoms or other concerns.  Allergies as of 05/20/2017   No Known Allergies     Medication List    STOP taking these medications   nitrofurantoin (macrocrystal-monohydrate) 100 MG capsule Commonly known as:  MACROBID     TAKE these medications   prenatal vitamin w/FE, FA 27-1 MG Tabs tablet Take 1 tablet by mouth daily.      Follow-up Information    [facility] Follow up on 06/01/2017.   Contact information: [address] [place] [address] [phone]           Shayon Trompeter CNM 05/20/2017, 7:19 PM

## 2017-05-20 NOTE — Discharge Instructions (Signed)

## 2017-05-23 ENCOUNTER — Ambulatory Visit (HOSPITAL_COMMUNITY)
Admission: RE | Admit: 2017-05-23 | Discharge: 2017-05-23 | Disposition: A | Discharge status: Home / Self Care | Payer: Medicaid Other | Source: Ambulatory Visit | Attending: Obstetrics & Gynecology | Admitting: Obstetrics & Gynecology

## 2017-05-23 DIAGNOSIS — Z3A19 19 weeks gestation of pregnancy: Secondary | ICD-10-CM | POA: Diagnosis not present

## 2017-05-23 DIAGNOSIS — Z3401 Encounter for supervision of normal first pregnancy, first trimester: Secondary | ICD-10-CM

## 2017-05-23 DIAGNOSIS — Z3689 Encounter for other specified antenatal screening: Secondary | ICD-10-CM | POA: Insufficient documentation

## 2017-05-23 LAB — URINE CULTURE
Culture: >=100000 — AB
SPECIAL REQUESTS: NORMAL

## 2017-05-24 ENCOUNTER — Telehealth: Payer: Self-pay | Admitting: Obstetrics and Gynecology

## 2017-05-27 NOTE — Telephone Encounter (Signed)
See note

## 2017-06-01 ENCOUNTER — Encounter: Payer: Medicaid Other | Admitting: Obstetrics and Gynecology

## 2017-06-19 ENCOUNTER — Ambulatory Visit (INDEPENDENT_AMBULATORY_CARE_PROVIDER_SITE_OTHER): Payer: Medicaid Other | Admitting: Obstetrics and Gynecology

## 2017-06-19 ENCOUNTER — Encounter: Payer: Self-pay | Admitting: Obstetrics and Gynecology

## 2017-06-19 DIAGNOSIS — Z3400 Encounter for supervision of normal first pregnancy, unspecified trimester: Secondary | ICD-10-CM

## 2017-06-19 DIAGNOSIS — Z3402 Encounter for supervision of normal first pregnancy, second trimester: Secondary | ICD-10-CM

## 2017-06-19 DIAGNOSIS — Z23 Encounter for immunization: Secondary | ICD-10-CM | POA: Diagnosis not present

## 2017-06-19 NOTE — Patient Instructions (Signed)
Contraception Choices Contraception (birth control) is the use of any methods or devices to prevent pregnancy. Below are some methods to help avoid pregnancy. Hormonal methods  Contraceptive implant. This is a thin, plastic tube containing progesterone hormone. It does not contain estrogen hormone. Your health care provider inserts the tube in the inner part of the upper arm. The tube can remain in place for up to 3 years. After 3 years, the implant must be removed. The implant prevents the ovaries from releasing an egg (ovulation), thickens the cervical mucus to prevent sperm from entering the uterus, and thins the lining of the inside of the uterus.  Progesterone-only injections. These injections are given every 3 months by your health care provider to prevent pregnancy. This synthetic progesterone hormone stops the ovaries from releasing eggs. It also thickens cervical mucus and changes the uterine lining. This makes it harder for sperm to survive in the uterus.  Birth control pills. These pills contain estrogen and progesterone hormone. They work by preventing the ovaries from releasing eggs (ovulation). They also cause the cervical mucus to thicken, preventing the sperm from entering the uterus. Birth control pills are prescribed by a health care provider.Birth control pills can also be used to treat heavy periods.  Minipill. This type of birth control pill contains only the progesterone hormone. They are taken every day of each month and must be prescribed by your health care provider.  Birth control patch. The patch contains hormones similar to those in birth control pills. It must be changed once a week and is prescribed by a health care provider.  Vaginal ring. The ring contains hormones similar to those in birth control pills. It is left in the vagina for 3 weeks, removed for 1 week, and then a new one is put back in place. The patient must be comfortable inserting and removing the ring from  the vagina.A health care provider's prescription is necessary.  Emergency contraception. Emergency contraceptives prevent pregnancy after unprotected sexual intercourse. This pill can be taken right after sex or up to 5 days after unprotected sex. It is most effective the sooner you take the pills after having sexual intercourse. Most emergency contraceptive pills are available without a prescription. Check with your pharmacist. Do not use emergency contraception as your only form of birth control. Barrier methods  Female condom. This is a thin sheath (latex or rubber) that is worn over the penis during sexual intercourse. It can be used with spermicide to increase effectiveness.  Female condom. This is a soft, loose-fitting sheath that is put into the vagina before sexual intercourse.  Diaphragm. This is a soft, latex, dome-shaped barrier that must be fitted by a health care provider. It is inserted into the vagina, along with a spermicidal jelly. It is inserted before intercourse. The diaphragm should be left in the vagina for 6 to 8 hours after intercourse.  Cervical cap. This is a round, soft, latex or plastic cup that fits over the cervix and must be fitted by a health care provider. The cap can be left in place for up to 48 hours after intercourse.  Sponge. This is a soft, circular piece of polyurethane foam. The sponge has spermicide in it. It is inserted into the vagina after wetting it and before sexual intercourse.  Spermicides. These are chemicals that kill or block sperm from entering the cervix and uterus. They come in the form of creams, jellies, suppositories, foam, or tablets. They do not require a prescription. They   are inserted into the vagina with an applicator before having sexual intercourse. The process must be repeated every time you have sexual intercourse. Intrauterine contraception  Intrauterine device (IUD). This is a T-shaped device that is put in a woman's uterus during  a menstrual period to prevent pregnancy. There are 2 types: ? Copper IUD. This type of IUD is wrapped in copper wire and is placed inside the uterus. Copper makes the uterus and fallopian tubes produce a fluid that kills sperm. It can stay in place for 10 years. ? Hormone IUD. This type of IUD contains the hormone progestin (synthetic progesterone). The hormone thickens the cervical mucus and prevents sperm from entering the uterus, and it also thins the uterine lining to prevent implantation of a fertilized egg. The hormone can weaken or kill the sperm that get into the uterus. It can stay in place for 3-5 years, depending on which type of IUD is used. Permanent methods of contraception  Female tubal ligation. This is when the woman's fallopian tubes are surgically sealed, tied, or blocked to prevent the egg from traveling to the uterus.  Hysteroscopic sterilization. This involves placing a small coil or insert into each fallopian tube. Your doctor uses a technique called hysteroscopy to do the procedure. The device causes scar tissue to form. This results in permanent blockage of the fallopian tubes, so the sperm cannot fertilize the egg. It takes about 3 months after the procedure for the tubes to become blocked. You must use another form of birth control for these 3 months.  Female sterilization. This is when the female has the tubes that carry sperm tied off (vasectomy).This blocks sperm from entering the vagina during sexual intercourse. After the procedure, the man can still ejaculate fluid (semen). Natural planning methods  Natural family planning. This is not having sexual intercourse or using a barrier method (condom, diaphragm, cervical cap) on days the woman could become pregnant.  Calendar method. This is keeping track of the length of each menstrual cycle and identifying when you are fertile.  Ovulation method. This is avoiding sexual intercourse during ovulation.  Symptothermal method.  This is avoiding sexual intercourse during ovulation, using a thermometer and ovulation symptoms.  Post-ovulation method. This is timing sexual intercourse after you have ovulated. Regardless of which type or method of contraception you choose, it is important that you use condoms to protect against the transmission of sexually transmitted infections (STIs). Talk with your health care provider about which form of contraception is most appropriate for you. This information is not intended to replace advice given to you by your health care provider. Make sure you discuss any questions you have with your health care provider. Document Released: 09/19/2005 Document Revised: 02/25/2016 Document Reviewed: 03/14/2013 Elsevier Interactive Patient Education  2017 Elsevier Inc[employer]  

## 2017-06-19 NOTE — Progress Notes (Signed)
   PRENATAL VISIT NOTE  Subjective:  Julie [name] is a 21 y.o. G1P0 at [redacted]w[redacted]d being seen today for ongoing prenatal care.  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  Patient reports no complaints.  Contractions: Not present. Vag. Bleeding: None.  Movement: Present. Denies leaking of fluid.   The following portions of the patient's history were reviewed and updated as appropriate: allergies, current medications, past family history, past medical history, past social history, past surgical history and problem list. Problem list updated.  Objective:   Vitals:   06/19/17 1107  BP: 111/71  Pulse: 76  Weight: 116 lb (52.6 kg)    Fetal Status: Fetal Heart Rate (bpm): 155   Movement: Present     General:  Alert, oriented and cooperative. Patient is in no acute distress.  Skin: Skin is warm and dry. No rash noted.   Cardiovascular: Normal heart rate noted  Respiratory: Normal respiratory effort, no problems with respiration noted  Abdomen: Soft, gravid, appropriate for gestational age.  Pain/Pressure: Present     Pelvic: Cervical exam deferred        Extremities: Normal range of motion.  Edema: None  Mental Status:  Normal mood and affect. Normal behavior. Normal judgment and thought content.   Assessment and Plan:  Pregnancy: G1P0 at [redacted]w[redacted]d  1. Encounter for supervision of normal first pregnancy, unspecified trimester Patient is doing well Flu vaccine today Patient was not able to enroll in BRx Third trimester labs, glucola and tdap next visit  Preterm labor symptoms and general obstetric precautions including but not limited to vaginal bleeding, contractions, leaking of fluid and fetal movement were reviewed in detail with the patient. Please refer to After Visit Summary for other counseling  recommendations.  Return in about 4 weeks (around 07/17/2017) for ROB, 2 hr glucola next visit.   Catalina Antigua, MD

## 2017-07-17 ENCOUNTER — Other Ambulatory Visit: Payer: Medicaid Other

## 2017-07-17 ENCOUNTER — Ambulatory Visit (INDEPENDENT_AMBULATORY_CARE_PROVIDER_SITE_OTHER): Payer: Medicaid Other | Admitting: Obstetrics and Gynecology

## 2017-07-17 ENCOUNTER — Encounter: Payer: Self-pay | Admitting: Obstetrics

## 2017-07-17 VITALS — BP 105/70 | HR 106 | Wt 126.0 lb

## 2017-07-17 DIAGNOSIS — Z3400 Encounter for supervision of normal first pregnancy, unspecified trimester: Secondary | ICD-10-CM

## 2017-07-17 DIAGNOSIS — Z23 Encounter for immunization: Secondary | ICD-10-CM

## 2017-07-17 DIAGNOSIS — O361920 Maternal care for other isoimmunization, second trimester, not applicable or unspecified: Secondary | ICD-10-CM

## 2017-07-17 DIAGNOSIS — Z3402 Encounter for supervision of normal first pregnancy, second trimester: Secondary | ICD-10-CM

## 2017-07-17 MED ORDER — PRENATAL PLUS 27-1 MG PO TABS: 1.0000 | ORAL_TABLET | Freq: Every day | ORAL | 11 refills | Status: DC

## 2017-07-17 NOTE — Addendum Note (Signed)
Addended by: Hermina Staggers on: 07/17/2017 10:27 AM   Modules accepted: Orders

## 2017-07-17 NOTE — Patient Instructions (Signed)
Third Trimester of Pregnancy The third trimester is from week 28 through week 40 (months 7 through 9). The third trimester is a time when the unborn baby (fetus) is growing rapidly. At the end of the ninth month, the fetus is about 20 inches in length and weighs 6-10 pounds. Body changes during your third trimester Your body will continue to go through many changes during pregnancy. The changes vary from woman to woman. During the third trimester:  Your weight will continue to increase. You can expect to gain 25-35 pounds (11-16 kg) by the end of the pregnancy.  You may begin to get stretch marks on your hips, abdomen, and breasts.  You may urinate more often because the fetus is moving lower into your pelvis and pressing on your bladder.  You may develop or continue to have heartburn. This is caused by increased hormones that slow down muscles in the digestive tract.  You may develop or continue to have constipation because increased hormones slow digestion and cause the muscles that push waste through your intestines to relax.  You may develop hemorrhoids. These are swollen veins (varicose veins) in the rectum that can itch or be painful.  You may develop swollen, bulging veins (varicose veins) in your legs.  You may have increased body aches in the pelvis, back, or thighs. This is due to weight gain and increased hormones that are relaxing your joints.  You may have changes in your hair. These can include thickening of your hair, rapid growth, and changes in texture. Some women also have hair loss during or after pregnancy, or hair that feels dry or thin. Your hair will most likely return to normal after your baby is born.  Your breasts will continue to grow and they will continue to become tender. A yellow fluid (colostrum) may leak from your breasts. This is the first milk you are producing for your baby.  Your belly button may stick out.  You may notice more swelling in your hands,  face, or ankles.  You may have increased tingling or numbness in your hands, arms, and legs. The skin on your belly may also feel numb.  You may feel short of breath because of your expanding uterus.  You may have more problems sleeping. This can be caused by the size of your belly, increased need to urinate, and an increase in your body's metabolism.  You may notice the fetus "dropping," or moving lower in your abdomen (lightening).  You may have increased vaginal discharge.  You may notice your joints feel loose and you may have pain around your pelvic bone.  What to expect at prenatal visits You will have prenatal exams every 2 weeks until week 36. Then you will have weekly prenatal exams. During a routine prenatal visit:  You will be weighed to make sure you and the baby are growing normally.  Your blood pressure will be taken.  Your abdomen will be measured to track your baby's growth.  The fetal heartbeat will be listened to.  Any test results from the previous visit will be discussed.  You may have a cervical check near your due date to see if your cervix has softened or thinned (effaced).  You will be tested for Group B streptococcus. This happens between 35 and 37 weeks.  Your health care provider may ask you:  What your birth plan is.  How you are feeling.  If you are feeling the baby move.  If you have had   any abnormal symptoms, such as leaking fluid, bleeding, severe headaches, or abdominal cramping.  If you are using any tobacco products, including cigarettes, chewing tobacco, and electronic cigarettes.  If you have any questions.  Other tests or screenings that may be performed during your third trimester include:  Blood tests that check for low iron levels (anemia).  Fetal testing to check the health, activity level, and growth of the fetus. Testing is done if you have certain medical conditions or if there are problems during the  pregnancy.  Nonstress test (NST). This test checks the health of your baby to make sure there are no signs of problems, such as the baby not getting enough oxygen. During this test, a belt is placed around your belly. The baby is made to move, and its heart rate is monitored during movement.  What is false labor? False labor is a condition in which you feel small, irregular tightenings of the muscles in the womb (contractions) that usually go away with rest, changing position, or drinking water. These are called Braxton Hicks contractions. Contractions may last for hours, days, or even weeks before true labor sets in. If contractions come at regular intervals, become more frequent, increase in intensity, or become painful, you should see your health care provider. What are the signs of labor?  Abdominal cramps.  Regular contractions that start at 10 minutes apart and become stronger and more frequent with time.  Contractions that start on the top of the uterus and spread down to the lower abdomen and back.  Increased pelvic pressure and dull back pain.  A watery or bloody mucus discharge that comes from the vagina.  Leaking of amniotic fluid. This is also known as your "water breaking." It could be a slow trickle or a gush. Let your health care provider know if it has a color or strange odor. If you have any of these signs, call your health care provider right away, even if it is before your due date. Follow these instructions at home: Medicines  Follow your health care provider's instructions regarding medicine use. Specific medicines may be either safe or unsafe to take during pregnancy.  Take a prenatal vitamin that contains at least 600 micrograms (mcg) of folic acid.  If you develop constipation, try taking a stool softener if your health care provider approves. Eating and drinking  Eat a balanced diet that includes fresh fruits and vegetables, whole grains, good sources of protein  such as meat, eggs, or tofu, and low-fat dairy. Your health care provider will help you determine the amount of weight gain that is right for you.  Avoid raw meat and uncooked cheese. These carry germs that can cause birth defects in the baby.  If you have low calcium intake from food, talk to your health care provider about whether you should take a daily calcium supplement.  Eat four or five small meals rather than three large meals a day.  Limit foods that are high in fat and processed sugars, such as fried and sweet foods.  To prevent constipation: ? Drink enough fluid to keep your urine clear or pale yellow. ? Eat foods that are high in fiber, such as fresh fruits and vegetables, whole grains, and beans. Activity  Exercise only as directed by your health care provider. Most women can continue their usual exercise routine during pregnancy. Try to exercise for 30 minutes at least 5 days a week. Stop exercising if you experience uterine contractions.  Avoid heavy   lifting.  Do not exercise in extreme heat or humidity, or at high altitudes.  Wear low-heel, comfortable shoes.  Practice good posture.  You may continue to have sex unless your health care provider tells you otherwise. Relieving pain and discomfort  Take frequent breaks and rest with your legs elevated if you have leg cramps or low back pain.  Take warm sitz baths to soothe any pain or discomfort caused by hemorrhoids. Use hemorrhoid cream if your health care provider approves.  Wear a good support bra to prevent discomfort from breast tenderness.  If you develop varicose veins: ? Wear support pantyhose or compression stockings as told by your healthcare provider. ? Elevate your feet for 15 minutes, 3-4 times a day. Prenatal care  Write down your questions. Take them to your prenatal visits.  Keep all your prenatal visits as told by your health care provider. This is important. Safety  Wear your seat belt at  all times when driving.  Make a list of emergency phone numbers, including numbers for family, friends, the hospital, and police and fire departments. General instructions  Avoid cat litter boxes and soil used by cats. These carry germs that can cause birth defects in the baby. If you have a cat, ask someone to clean the litter box for you.  Do not travel far distances unless it is absolutely necessary and only with the approval of your health care provider.  Do not use hot tubs, steam rooms, or saunas.  Do not drink alcohol.  Do not use any products that contain nicotine or tobacco, such as cigarettes and e-cigarettes. If you need help quitting, ask your health care provider.  Do not use any medicinal herbs or unprescribed drugs. These chemicals affect the formation and growth of the baby.  Do not douche or use tampons or scented sanitary pads.  Do not cross your legs for long periods of time.  To prepare for the arrival of your baby: ? Take prenatal classes to understand, practice, and ask questions about labor and delivery. ? Make a trial run to the hospital. ? Visit the hospital and tour the maternity area. ? Arrange for maternity or paternity leave through employers. ? Arrange for family and friends to take care of pets while you are in the hospital. ? Purchase a rear-facing car seat and make sure you know how to install it in your car. ? Pack your hospital bag. ? Prepare the baby's nursery. Make sure to remove all pillows and stuffed animals from the baby's crib to prevent suffocation.  Visit your dentist if you have not gone during your pregnancy. Use a soft toothbrush to brush your teeth and be gentle when you floss. Contact a health care provider if:  You are unsure if you are in labor or if your water has broken.  You become dizzy.  You have mild pelvic cramps, pelvic pressure, or nagging pain in your abdominal area.  You have lower back pain.  You have persistent  nausea, vomiting, or diarrhea.  You have an unusual or bad smelling vaginal discharge.  You have pain when you urinate. Get help right away if:  Your water breaks before 37 weeks.  You have regular contractions less than 5 minutes apart before 37 weeks.  You have a fever.  You are leaking fluid from your vagina.  You have spotting or bleeding from your vagina.  You have severe abdominal pain or cramping.  You have rapid weight loss or weight gain.    You have shortness of breath with chest pain.  You notice sudden or extreme swelling of your face, hands, ankles, feet, or legs.  Your baby makes fewer than 10 movements in 2 hours.  You have severe headaches that do not go away when you take medicine.  You have vision changes. Summary  The third trimester is from week 28 through week 40, months 7 through 9. The third trimester is a time when the unborn baby (fetus) is growing rapidly.  During the third trimester, your discomfort may increase as you and your baby continue to gain weight. You may have abdominal, leg, and back pain, sleeping problems, and an increased need to urinate.  During the third trimester your breasts will keep growing and they will continue to become tender. A yellow fluid (colostrum) may leak from your breasts. This is the first milk you are producing for your baby.  False labor is a condition in which you feel small, irregular tightenings of the muscles in the womb (contractions) that eventually go away. These are called Braxton Hicks contractions. Contractions may last for hours, days, or even weeks before true labor sets in.  Signs of labor can include: abdominal cramps; regular contractions that start at 10 minutes apart and become stronger and more frequent with time; watery or bloody mucus discharge that comes from the vagina; increased pelvic pressure and dull back pain; and leaking of amniotic fluid. This information is not intended to replace advice  given to you by your health care provider. Make sure you discuss any questions you have with your health care provider. Document Released: 09/13/2001 Document Revised: 02/25/2016 Document Reviewed: 11/20/2012 Elsevier Interactive Patient Education  2017 [employer]  

## 2017-07-17 NOTE — Progress Notes (Signed)
Subjective:  Julie [name] is a 21 y.o. G1P0 at [redacted]w[redacted]d being seen today for ongoing prenatal care.  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  Patient reports no complaints.  Contractions: Not present. Vag. Bleeding: None.  Movement: Present. Denies leaking of fluid.   The following portions of the patient's history were reviewed and updated as appropriate: allergies, current medications, past family history, past medical history, past social history, past surgical history and problem list. Problem list updated.  Objective:   Vitals:   07/17/17 0905  BP: 105/70  Pulse: (!) 106  Weight: 126 lb (57.2 kg)    Fetal Status:     Movement: Present     General:  Alert, oriented and cooperative. Patient is in no acute distress.  Skin: Skin is warm and dry. No rash noted.   Cardiovascular: Normal heart rate noted  Respiratory: Normal respiratory effort, no problems with respiration noted  Abdomen: Soft, gravid, appropriate for gestational age. Pain/Pressure: Absent     Pelvic:  Cervical exam deferred        Extremities: Normal range of motion.  Edema: None  Mental Status: Normal mood and affect. Normal behavior. Normal judgment and thought content.   Urinalysis:      Assessment and Plan:  Pregnancy: G1P0 at [redacted]w[redacted]d  1. Encounter for supervision of normal first pregnancy, unspecified trimester Stable Note for work to limit lifting to 25 # or < - HIV antibody - CBC - RPR - Glucose Tolerance, 2 Hours w/1 Hour  2. 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 Please  refer to After Visit Summary for other counseling recommendations.  Return in about 3 weeks (around 08/07/2017) for OB visit.   Hermina Staggers, MD

## 2017-07-18 LAB — CBC
HEMATOCRIT: 34.6 % (ref 34.0–46.6)
Hemoglobin: 11.7 g/dL (ref 11.1–15.9)
MCH: 31.4 pg (ref 26.6–33.0)
MCHC: 33.8 g/dL (ref 31.5–35.7)
MCV: 93 fL (ref 79–97)
PLATELETS: 263 10*3/uL (ref 150–379)
RBC: 3.73 x10E6/uL — AB (ref 3.77–5.28)
RDW: 13.6 % (ref 12.3–15.4)
WBC: 5.7 10*3/uL (ref 3.4–10.8)

## 2017-07-18 LAB — GLUCOSE TOLERANCE, 2 HOURS W/ 1HR
GLUCOSE, 2 HOUR: 89 mg/dL (ref 65–152)
Glucose, 1 hour: 94 mg/dL (ref 65–179)
Glucose, Fasting: 76 mg/dL (ref 65–91)

## 2017-07-18 LAB — RPR: RPR Ser Ql: NONREACTIVE

## 2017-07-18 LAB — HIV ANTIBODY (ROUTINE TESTING W REFLEX): HIV SCREEN 4TH GENERATION: NONREACTIVE

## 2017-08-08 ENCOUNTER — Encounter: Payer: Self-pay | Admitting: Obstetrics and Gynecology

## 2017-08-08 ENCOUNTER — Ambulatory Visit (INDEPENDENT_AMBULATORY_CARE_PROVIDER_SITE_OTHER): Payer: Medicaid Other | Admitting: Obstetrics and Gynecology

## 2017-08-08 DIAGNOSIS — Z3403 Encounter for supervision of normal first pregnancy, third trimester: Secondary | ICD-10-CM

## 2017-08-08 DIAGNOSIS — Z3400 Encounter for supervision of normal first pregnancy, unspecified trimester: Secondary | ICD-10-CM

## 2017-08-08 NOTE — Progress Notes (Signed)
Subjective:  Julie [name] is a 21 y.o. G1P0 at 2662w0d being seen today for ongoing prenatal care.  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  Patient reports no complaints.  Contractions: Not present. Vag. Bleeding: None.  Movement: Present. Denies leaking of fluid.   The following portions of the patient's history were reviewed and updated as appropriate: allergies, current medications, past family history, past medical history, past social history, past surgical history and problem list. Problem list updated.  Objective:   Vitals:   08/08/17 0838  BP: 110/73  Pulse: (!) 105  Weight: 127 lb (57.6 kg)    Fetal Status: Fetal Heart Rate (bpm): 140   Movement: Present     General:  Alert, oriented and cooperative. Patient is in no acute distress.  Skin: Skin is warm and dry. No rash noted.   Cardiovascular: Normal heart rate noted  Respiratory: Normal respiratory effort, no problems with respiration noted  Abdomen: Soft, gravid, appropriate for gestational age. Pain/Pressure: Absent     Pelvic:  Cervical exam deferred        Extremities: Normal range of motion.  Edema: None  Mental Status: Normal mood and affect. Normal behavior. Normal judgment and thought content.   Urinalysis:      Assessment and Plan:  Pregnancy: G1P0 at 6662w0d  1. Encounter for supervision of normal first pregnancy, unspecified trimester Stable  Preterm labor symptoms and general obstetric precautions including but not limited to vaginal bleeding, contractions, leaking of fluid and fetal movement were reviewed in detail with the patient. Please refer to After Visit Summary for other counseling recommendations.  Return in about 2 weeks (around 08/22/2017) for OB visit.   Hermina StaggersErvin, Michael L, MD

## 2017-08-16 ENCOUNTER — Telehealth: Payer: Self-pay | Admitting: *Deleted

## 2017-08-16 NOTE — Telephone Encounter (Signed)
Received message left on nurse voicemail 08/16/17 at 1048.  Patient states she wants to know when she can start her maternity leave.  [facility]  Will forward message to their pool.  Patient requests a return call.

## 2017-08-17 NOTE — Telephone Encounter (Signed)
Attempted to call patient back regarding her maternity leave question. Patient was not in and no voicemail was available.  Patient has routine OB appointment on 08/22/17 at can discuss at that time with her physician.

## 2017-08-22 ENCOUNTER — Ambulatory Visit (INDEPENDENT_AMBULATORY_CARE_PROVIDER_SITE_OTHER): Payer: Medicaid Other | Admitting: Obstetrics and Gynecology

## 2017-08-22 ENCOUNTER — Encounter: Payer: Self-pay | Admitting: Obstetrics and Gynecology

## 2017-08-22 DIAGNOSIS — Z3400 Encounter for supervision of normal first pregnancy, unspecified trimester: Secondary | ICD-10-CM

## 2017-08-22 DIAGNOSIS — Z3403 Encounter for supervision of normal first pregnancy, third trimester: Secondary | ICD-10-CM

## 2017-08-22 NOTE — Progress Notes (Signed)
Patient reports good fetal movement, denies pain. 

## 2017-08-22 NOTE — Progress Notes (Signed)
Subjective:  Julie [name] is a 21 y.o. G1P0 at 4042w0d being seen today for ongoing prenatal care.  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  Patient reports no complaints.  Contractions: Not present. Vag. Bleeding: None.  Movement: Present. Denies leaking of fluid.   The following portions of the patient's history were reviewed and updated as appropriate: allergies, current medications, past family history, past medical history, past social history, past surgical history and problem list. Problem list updated.  Objective:   Vitals:   08/22/17 0941  BP: 102/69  Pulse: (!) 106  Weight: 128 lb 3.2 oz (58.2 kg)    Fetal Status: Fetal Heart Rate (bpm): 133   Movement: Present     General:  Alert, oriented and cooperative. Patient is in no acute distress.  Skin: Skin is warm and dry. No rash noted.   Cardiovascular: Normal heart rate noted  Respiratory: Normal respiratory effort, no problems with respiration noted  Abdomen: Soft, gravid, appropriate for gestational age. Pain/Pressure: Absent     Pelvic:  Cervical exam deferred        Extremities: Normal range of motion.  Edema: None  Mental Status: Normal mood and affect. Normal behavior. Normal judgment and thought content.   Urinalysis:      Assessment and Plan:  Pregnancy: G1P0 at 3042w0d  1. Encounter for supervision of normal first pregnancy, unspecified trimester Stable  Preterm labor symptoms and general obstetric precautions including but not limited to vaginal bleeding, contractions, leaking of fluid and fetal movement were reviewed in detail with the patient. Please refer to After Visit Summary for other counseling recommendations.  Return in about 2 weeks (around 09/05/2017) for OB visit.   Hermina StaggersErvin, Hezzie Karim L, MD

## 2017-08-25 ENCOUNTER — Encounter (HOSPITAL_COMMUNITY): Payer: Self-pay | Admitting: *Deleted

## 2017-08-25 ENCOUNTER — Inpatient Hospital Stay (HOSPITAL_COMMUNITY)
Admission: AD | Admit: 2017-08-25 | Discharge: 2017-08-25 | Disposition: A | Discharge status: Home / Self Care | Payer: Medicaid Other | Source: Ambulatory Visit | Attending: Obstetrics and Gynecology | Admitting: Obstetrics and Gynecology

## 2017-08-25 DIAGNOSIS — R1010 Upper abdominal pain, unspecified: Secondary | ICD-10-CM | POA: Diagnosis not present

## 2017-08-25 DIAGNOSIS — O361920 Maternal care for other isoimmunization, second trimester, not applicable or unspecified: Secondary | ICD-10-CM

## 2017-08-25 DIAGNOSIS — Z833 Family history of diabetes mellitus: Secondary | ICD-10-CM | POA: Diagnosis not present

## 2017-08-25 DIAGNOSIS — Z87891 Personal history of nicotine dependence: Secondary | ICD-10-CM | POA: Diagnosis not present

## 2017-08-25 DIAGNOSIS — O26893 Other specified pregnancy related conditions, third trimester: Secondary | ICD-10-CM | POA: Diagnosis not present

## 2017-08-25 DIAGNOSIS — Z800 Family history of malignant neoplasm of digestive organs: Secondary | ICD-10-CM | POA: Insufficient documentation

## 2017-08-25 DIAGNOSIS — Z3A32 32 weeks gestation of pregnancy: Secondary | ICD-10-CM | POA: Diagnosis not present

## 2017-08-25 DIAGNOSIS — R109 Unspecified abdominal pain: Secondary | ICD-10-CM

## 2017-08-25 LAB — URINALYSIS, ROUTINE W REFLEX MICROSCOPIC
Bilirubin Urine: NEGATIVE
GLUCOSE, UA: 150 mg/dL — AB
Hgb urine dipstick: NEGATIVE
KETONES UR: NEGATIVE mg/dL
Nitrite: NEGATIVE
PH: 6 (ref 5.0–8.0)
Protein, ur: NEGATIVE mg/dL
SPECIFIC GRAVITY, URINE: 1.009 (ref 1.005–1.030)

## 2017-08-25 NOTE — MAU Provider Note (Signed)
History     CSN: [id number]  Arrival date and time: 08/25/17 1758   First Provider Initiated Contact with Patient 08/25/17 1839      Chief Complaint  Patient presents with  . Abdominal Pain   HPI Julie [name] 21 y.o. 7376w3d  Comes to MAU with upper abdominal pain and pelvic pain that radiates down the inner thighs of both legs.  Baby is moving well.  Denies any vaginal bleeding or vaginal leaking.  [facility] and has an appointment later in the week.  OB History    Gravida Para Term Preterm AB Living   1             SAB TAB Ectopic Multiple Live Births                  Past Medical History:  Diagnosis Date  . GERD (gastroesophageal reflux disease)     Past Surgical History:  Procedure Laterality Date  . NO PAST SURGERIES      Family History  Problem Relation Age of Onset  . Diabetes Mother   . Pancreatic cancer Father   . Cancer Maternal Grandfather     Social History   Tobacco Use  . Smoking status: Former Games developermoker  . Smokeless tobacco: Never Used  Substance Use Topics  . Alcohol use: No  . Drug use: No    Allergies: No Known Allergies  Medications Prior to Admission  Medication Sig Dispense Refill Last Dose  . prenatal vitamin w/FE, FA (PRENATAL 1 + 1) 27-1 MG TABS tablet Take 1 tablet by mouth daily. 30 each 11 Taking    Review of Systems  Constitutional: Negative for fever.  Gastrointestinal: Positive for abdominal pain. Negative for nausea and vomiting.  Genitourinary: Positive for pelvic pain.  Musculoskeletal: Negative for back pain.   Physical Exam   Blood pressure 104/66, pulse (!) 128, temperature 98.8 F (37.1 C), resp. rate 18, height 5\' 5"  (1.651 m), weight 130 lb (59 kg), last menstrual period 01/10/2017.  Physical Exam  Nursing note and vitals reviewed. Constitutional: She is oriented to person, place, and time. She appears well-developed and well-nourished.  HENT:  Head: Normocephalic.  Eyes: EOM are normal.   Neck: Neck supple.  Respiratory: Effort normal.  GI: Soft. There is no tenderness. There is no rebound and no guarding.  Has tenderness with palpation of pubic symphysis.  Had upper abdominal pain before coming in but it is now resolved. FHT on monitor 155 baseline with moderate variability. No contractions. No decerations. Accelrations of 15x15 noted - reactive strip.  Genitourinary:  Genitourinary Comments: Cervical exam - cervix soft and closed.  Vertex palpated at -2 station. No blood seen.  Musculoskeletal: Normal range of motion.  Neurological: She is alert and oriented to person, place, and time.  Skin: Skin is warm and dry.  Psychiatric: She has a normal mood and affect.    MAU Course  Procedures Results for orders placed or performed during the hospital encounter of 08/25/17 (from the past 24 hour(s))  Urinalysis, Routine w reflex microscopic     Status: Abnormal   Collection Time: 08/25/17  6:01 PM  Result Value Ref Range   Color, Urine YELLOW YELLOW   APPearance CLEAR CLEAR   Specific Gravity, Urine 1.009 1.005 - 1.030   pH 6.0 5.0 - 8.0   Glucose, UA 150 (A) NEGATIVE mg/dL   Hgb urine dipstick NEGATIVE NEGATIVE   Bilirubin Urine NEGATIVE NEGATIVE   Ketones, ur  NEGATIVE NEGATIVE mg/dL   Protein, ur NEGATIVE NEGATIVE mg/dL   Nitrite NEGATIVE NEGATIVE   Leukocytes, UA TRACE (A) NEGATIVE   RBC / HPF 0-5 0 - 5 RBC/hpf   WBC, UA 0-5 0 - 5 WBC/hpf   Bacteria, UA RARE (A) NONE SEEN   Squamous Epithelial / LPF 0-5 (A) NONE SEEN   Mucus PRESENT     MDM Likely has pubic symphysis discomfort that is radiating down her inner thighs.  Does not have trouble stepping into the tub or getting into the car.  Advised stretching (sitting with legs in a V shape and leaning forward and also sitting on an exercise ball and rocking gently from side to side to stretch inner thighs).  Reviewed signs of preterm birth.  Assessment and Plan  Pubic symphysis discomfort Abdominal pain in  pregnancy - now is resolved.  Plan Use stretching exercises to prepare for birth Keep your scheduled appointment in the office. Return if you have worsening contractions, leaking of fluid or vaginal bleeding.  Lyncoln Maskell L Corean Yoshimura 08/25/2017, 6:59 PM

## 2017-08-25 NOTE — Discharge Instructions (Signed)
Keep your appointment in the office on Tuesday. Return if you have worsening contractions, leaking of fluid or vaginal bleeding.

## 2017-08-25 NOTE — MAU Note (Signed)
Pt reports she started having abd cramping on and off about 1 hr ago. Also c/o sharp pain in her left groin that gets worse when she walks.Denies any vag bleeding or discharge. Good fetal movement reported.

## 2017-09-05 ENCOUNTER — Encounter: Payer: Self-pay | Admitting: Certified Nurse Midwife

## 2017-09-05 ENCOUNTER — Ambulatory Visit (INDEPENDENT_AMBULATORY_CARE_PROVIDER_SITE_OTHER): Payer: Medicaid Other | Admitting: Certified Nurse Midwife

## 2017-09-05 DIAGNOSIS — Z3403 Encounter for supervision of normal first pregnancy, third trimester: Secondary | ICD-10-CM

## 2017-09-05 DIAGNOSIS — Z3400 Encounter for supervision of normal first pregnancy, unspecified trimester: Secondary | ICD-10-CM

## 2017-09-05 NOTE — Progress Notes (Signed)
   PRENATAL VISIT NOTE  Subjective:  Julie [name] is a 21 y.o. G1P0 at 3729w0d being seen today for ongoing prenatal care.  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  Patient reports no complaints.  Contractions: Irregular. Vag. Bleeding: None.  Movement: Present. Denies leaking of fluid.   The following portions of the patient's history were reviewed and updated as appropriate: allergies, current medications, past family history, past medical history, past social history, past surgical history and problem list. Problem list updated.  Objective:   Vitals:   09/05/17 1010  BP: 122/78  Pulse: (!) 106  Weight: 132 lb (59.9 kg)    Fetal Status: Fetal Heart Rate (bpm): 137; doppler Fundal Height: 34 cm Movement: Present     General:  Alert, oriented and cooperative. Patient is in no acute distress.  Skin: Skin is warm and dry. No rash noted.   Cardiovascular: Normal heart rate noted  Respiratory: Normal respiratory effort, no problems with respiration noted  Abdomen: Soft, gravid, appropriate for gestational age.  Pain/Pressure: Present     Pelvic: Cervical exam deferred        Extremities: Normal range of motion.     Mental Status:  Normal mood and affect. Normal behavior. Normal judgment and thought content.   Assessment and Plan:  Pregnancy: G1P0 at 929w0d  1. Encounter for supervision of normal first pregnancy, unspecified trimester     Doing well.  Prenatal classes/pediaticians discussed.  Preterm labor symptoms and general obstetric precautions including but not limited to vaginal bleeding, contractions, leaking of fluid and fetal movement were reviewed in detail with the patient. Please refer to After Visit Summary for other counseling recommendations.  Return in about 1 week (around 09/12/2017)  for ROB, GBS.   Roe Coombsachelle A Rosell Khouri, CNM

## 2017-09-12 ENCOUNTER — Encounter: Payer: Medicaid Other | Admitting: Certified Nurse Midwife

## 2017-09-14 ENCOUNTER — Encounter: Payer: Medicaid Other | Admitting: Obstetrics & Gynecology

## 2017-09-22 ENCOUNTER — Encounter: Payer: Medicaid Other | Admitting: Certified Nurse Midwife

## 2017-09-26 ENCOUNTER — Encounter (HOSPITAL_COMMUNITY): Payer: Self-pay | Admitting: *Deleted

## 2017-09-26 ENCOUNTER — Inpatient Hospital Stay (HOSPITAL_COMMUNITY)
Admission: AD | Admit: 2017-09-26 | Discharge: 2017-09-26 | Disposition: A | Discharge status: Home / Self Care | Payer: Medicaid Other | Source: Ambulatory Visit | Attending: Family Medicine | Admitting: Family Medicine

## 2017-09-26 DIAGNOSIS — Z79899 Other long term (current) drug therapy: Secondary | ICD-10-CM | POA: Insufficient documentation

## 2017-09-26 DIAGNOSIS — O99613 Diseases of the digestive system complicating pregnancy, third trimester: Secondary | ICD-10-CM | POA: Insufficient documentation

## 2017-09-26 DIAGNOSIS — K219 Gastro-esophageal reflux disease without esophagitis: Secondary | ICD-10-CM | POA: Insufficient documentation

## 2017-09-26 DIAGNOSIS — Z87891 Personal history of nicotine dependence: Secondary | ICD-10-CM | POA: Insufficient documentation

## 2017-09-26 DIAGNOSIS — O368130 Decreased fetal movements, third trimester, not applicable or unspecified: Secondary | ICD-10-CM | POA: Insufficient documentation

## 2017-09-26 DIAGNOSIS — Z3A37 37 weeks gestation of pregnancy: Secondary | ICD-10-CM | POA: Insufficient documentation

## 2017-09-26 DIAGNOSIS — R040 Epistaxis: Secondary | ICD-10-CM | POA: Diagnosis not present

## 2017-09-26 NOTE — MAU Note (Signed)
Pt presents to MAU with complaints of a decrease in fetal movement that started today. Denies any VB or LOF

## 2017-09-26 NOTE — MAU Provider Note (Signed)
[facility]   CSN: [id number] Arrival date & time: 09/26/17  1621     History   Chief Complaint Chief Complaint  Patient presents with  . Decreased Fetal Movement    HPI Julie [name] is a 21 y.o. G1P0 @ 2968w0d gestation who presents to the MAU with nose bleed and "baby check". Patient reports that she has not been feeling the baby move today as much as usual and they she was told in the office it that happened to come to MAU. Patient reports that when she blew her nose today she saw some blood in with the mucous.   HPI  Past Medical History:  Diagnosis Date  . GERD (gastroesophageal reflux disease)     Patient Active Problem List   Diagnosis Date Noted  . 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 Encounter for supervision of normal first pregnancy, unspecified trimester 04/17/2017    Past Surgical History:  Procedure Laterality Date  . NO PAST SURGERIES      OB History    Gravida Para Term Preterm AB Living   1             SAB TAB Ectopic Multiple Live Births                   Home Medications    Prior to Admission medications   Medication Sig Start Date End Date Taking? Authorizing Provider  prenatal vitamin w/FE, FA (PRENATAL 1 + 1) 27-1 MG TABS tablet Take 1 tablet by mouth daily. 07/17/17   Hermina StaggersErvin, Michael L, MD    Family History Family History  Problem Relation Age of Onset  . Diabetes Mother   . Pancreatic cancer Father   . Cancer Maternal Grandfather     Social History Social History   Tobacco Use  . Smoking status: Former Games developermoker  . Smokeless tobacco: Never Used  Substance Use Topics  . Alcohol use: No  . Drug use: No     Allergies   Patient has no known allergies.   Review of Systems Review of Systems  Constitutional: Negative for chills and fever.  HENT: Positive for nosebleeds. Negative for congestion and sore throat.   Eyes: Negative for discharge, redness  and visual disturbance.  Respiratory: Negative for cough.   Cardiovascular: Negative for chest pain.  Gastrointestinal: Negative for abdominal pain, nausea and vomiting.       Decreased fetal movement.  Genitourinary: Negative for dysuria, urgency, vaginal bleeding and vaginal discharge.  Musculoskeletal: Negative for neck pain.  Skin: Negative for rash.  Neurological: Negative for headaches.  Psychiatric/Behavioral: Negative for confusion.     Physical Exam Updated Vital Signs BP 134/81   Pulse 96   Temp 98.8 F (37.1 C)   Resp 18   LMP 01/10/2017 (LMP Unknown)   Physical Exam  Constitutional: She appears well-developed and well-nourished. No distress.  HENT:  Head: Normocephalic.  Right Ear: Tympanic membrane normal.  Left Ear: Tympanic membrane normal.  Nose: Mucosal edema present. No epistaxis.  Swelling and erythema of the left nostril. No active bleeding.   Eyes: Conjunctivae and EOM are normal. Pupils are equal, round, and reactive to light.  Neck: Neck supple.  Cardiovascular: Normal rate.  Pulmonary/Chest: Effort normal.  Abdominal: Soft. There is no tenderness.  Musculoskeletal: Normal range of motion.  Neurological: She is alert.  Skin: Skin is warm and dry.  Psychiatric: She has a normal mood and affect. Her behavior is  normal.  Nursing note and vitals reviewed.    ED Treatments / Results  Labs (all labs ordered are listed, but only abnormal results are displayed) Labs Reviewed - No data to display  Radiology No results found.  Procedures Procedures (including critical care time)  Medications Ordered in ED Medications - No data to display   Initial Impression / Assessment and Plan / ED Course  I have reviewed the triage vital signs and the nursing notes. 21 y.o. G1P0 @ 1635w0d gestation with feeling of decreased fetal movement stable for d/c with reactive FM tracing. No contractions. Tracing reviewed with Dr. Shawnie PonsPratt. Discussed kick count with the  patient and return precautions. Discussed nose bleeding and dry heat and using cool mist vaporizer and normal saline nasal spray.  Patient agrees with plan.   Final Clinical Impressions(s) / ED Diagnoses   Final diagnoses:  Decreased fetal movements in third trimester, single or unspecified fetus  Epistaxis    ED Discharge Orders        Ordered    Diet - low sodium heart healthy     09/26/17 1731

## 2017-10-03 NOTE — L&D Delivery Note (Signed)
Patient is 22 y.o. G1P0 3044w2d admitted for SOL. AROM at 1935.  [facility]ren[facility].  Delivery Note After a 30 minute 2nd stage, at 8:41 PM a viable female was delivered via Vaginal, Spontaneous (Presentation: LOA).  APGAR: 9, 9; weight  pending.   Placenta status: spontaneous, intact.  Cord:  3 vessel   Anesthesia: epidural  Episiotomy: None Lacerations: None Est. Blood Loss (mL):  150   Head delivered LOA. No nuchal cord present. Shoulder and body delivered in usual fashion. Infant with spontaneous cry, placed on mother's abdomen, dried and bulb suctioned. Cord clamped x 2 after 1-minute delay, and cut by family member. Cord blood drawn. Placenta delivered spontaneously with gentle cord traction. Fundus firm with massage and Pitocin. Perineum inspected and found to have no laceration. Fundus firm  Mom to postpartum.  Baby to Couplet care / Skin to Skin.  Oralia ManisSherin Abraham, DO PGY 1 10/12/2017, 9:00 PM    The above was performed under my direct supervision and guidance.  CRESENZO-DISHMAN,Halen Mossbarger    Please schedule this patient for PP visit in: 4 weeks Low risk pregnancy complicated by: GHTN Delivery mode:  SVD Anticipated Birth Control:  Depo or maybe inpt nexplanon PP Procedures needed: BP check in one week Schedule Integrated BH visit: no Provider: Any provider

## 2017-10-05 ENCOUNTER — Other Ambulatory Visit: Payer: Self-pay

## 2017-10-05 ENCOUNTER — Ambulatory Visit (INDEPENDENT_AMBULATORY_CARE_PROVIDER_SITE_OTHER): Payer: Medicaid Other | Admitting: Nurse Practitioner

## 2017-10-05 ENCOUNTER — Other Ambulatory Visit (HOSPITAL_COMMUNITY)
Admission: RE | Admit: 2017-10-05 | Discharge: 2017-10-05 | Disposition: A | Discharge status: Home / Self Care | Payer: Medicaid Other | Source: Ambulatory Visit | Attending: Certified Nurse Midwife | Admitting: Certified Nurse Midwife

## 2017-10-05 VITALS — BP 121/70 | HR 92 | Wt 137.0 lb

## 2017-10-05 DIAGNOSIS — Z3400 Encounter for supervision of normal first pregnancy, unspecified trimester: Secondary | ICD-10-CM

## 2017-10-05 DIAGNOSIS — Z3403 Encounter for supervision of normal first pregnancy, third trimester: Secondary | ICD-10-CM

## 2017-10-05 NOTE — Patient Instructions (Addendum)
Braxton Hicks Contractions °Contractions of the uterus can occur throughout pregnancy, but they are not always a sign that you are in labor. You may have practice contractions called Braxton Hicks contractions. These false labor contractions are sometimes confused with true labor. °What are Braxton Hicks contractions? °Braxton Hicks contractions are tightening movements that occur in the muscles of the uterus before labor. Unlike true labor contractions, these contractions do not result in opening (dilation) and thinning of the cervix. Toward the end of pregnancy (32-34 weeks), Braxton Hicks contractions can happen more often and may become stronger. These contractions are sometimes difficult to tell apart from true labor because they can be very uncomfortable. You should not feel embarrassed if you go to the hospital with false labor. °Sometimes, the only way to tell if you are in true labor is for your health care provider to look for changes in the cervix. The health care provider will do a physical exam and may monitor your contractions. If you are not in true labor, the exam should show that your cervix is not dilating and your water has not broken. °If there are other health problems associated with your pregnancy, it is completely safe for you to be sent home with false labor. You may continue to have Braxton Hicks contractions until you go into true labor. °How to tell the difference between true labor and false labor °True labor °· Contractions last 30-70 seconds. °· Contractions become very regular. °· Discomfort is usually felt in the top of the uterus, and it spreads to the lower abdomen and low back. °· Contractions do not go away with walking. °· Contractions usually become more intense and increase in frequency. °· The cervix dilates and gets thinner. °False labor °· Contractions are usually shorter and not as strong as true labor contractions. °· Contractions are usually irregular. °· Contractions  are often felt in the front of the lower abdomen and in the groin. °· Contractions may go away when you walk around or change positions while lying down. °· Contractions get weaker and are shorter-lasting as time goes on. °· The cervix usually does not dilate or become thin. °Follow these instructions at home: °· Take over-the-counter and prescription medicines only as told by your health care provider. °· Keep up with your usual exercises and follow other instructions from your health care provider. °· Eat and drink lightly if you think you are going into labor. °· If Braxton Hicks contractions are making you uncomfortable: °? Change your position from lying down or resting to walking, or change from walking to resting. °? Sit and rest in a tub of warm water. °? Drink enough fluid to keep your urine pale yellow. Dehydration may cause these contractions. °? Do slow and deep breathing several times an hour. °· Keep all follow-up prenatal visits as told by your health care provider. This is important. °Contact a health care provider if: °· You have a fever. °· You have continuous pain in your abdomen. °Get help right away if: °· Your contractions become stronger, more regular, and closer together. °· You have fluid leaking or gushing from your vagina. °· You pass blood-tinged mucus (bloody show). °· You have bleeding from your vagina. °· You have low back pain that you never had before. °· You feel your baby’s head pushing down and causing pelvic pressure. °· Your baby is not moving inside you as much as it used to. °Summary °· Contractions that occur before labor are called Braxton   Hicks contractions, false labor, or practice contractions. °· Braxton Hicks contractions are usually shorter, weaker, farther apart, and less regular than true labor contractions. True labor contractions usually become progressively stronger and regular and they become more frequent. °· Manage discomfort from Braxton Hicks contractions by  changing position, resting in a warm bath, drinking plenty of water, or practicing deep breathing. °This information is not intended to replace advice given to you by your health care provider. Make sure you discuss any questions you have with your health care provider. °Document Released: 02/02/2017 Document Revised: 02/02/2017 Document Reviewed: 02/02/2017 °Elsevier Interactive Patient Education © 2018 [employer] ° °

## 2017-10-05 NOTE — Progress Notes (Signed)
    Subjective:  Julie SanderShanteka Dunshee is a 22 y.o. G1P0 at 8470w2d being seen today for ongoing prenatal care.  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  Patient reports no complaints.  Contractions: Irritability. Vag. Bleeding: None.  Movement: Present. Denies leaking of fluid.   The following portions of the patient's history were reviewed and updated as appropriate: allergies, current medications, past family history, past medical history, past social history, past surgical history and problem list. Problem list updated.  Objective:   Vitals:   10/05/17 1101  BP: 121/70  Pulse: 92  Weight: 137 lb (62.1 kg)    Fetal Status: Fetal Heart Rate (bpm): 132; doppler Fundal Height: 36 cm Movement: Present     General:  Alert, oriented and cooperative. Patient is in no acute distress.  Skin: Skin is warm and dry. No rash noted.   Cardiovascular: Normal heart rate noted  Respiratory: Normal respiratory effort, no problems with respiration noted  Abdomen: Soft, gravid, appropriate for gestational age. Pain/Pressure: Absent     Pelvic:  Cervical exam performed I cm thick vertex at -3 station, no bleeding        Extremities: Normal range of motion.  Edema: None  Mental Status: Normal mood and affect. Normal behavior. Normal judgment and thought content.     Assessment and Plan:  Pregnancy: G1P0 at 4870w2d  1. Encounter for supervision of normal first pregnancy, unspecified trimester Reviewed signs of labor and when to go to the hospital  - Strep Gp B NAA - Cervicovaginal ancillary only  Term labor symptoms and general obstetric precautions including but not limited to vaginal bleeding, contractions, leaking of fluid and fetal movement were reviewed in detail with the patient. Please refer to After Visit Summary  for other counseling recommendations.  Return in about 1 week (around 10/12/2017).  Nolene BernheimERRI BURLESON, RN, MSN, NP-BC Nurse Practitioner, [facility], [facility] 10/05/2017 12:23 PM

## 2017-10-06 LAB — CERVICOVAGINAL ANCILLARY ONLY
Chlamydia: NEGATIVE
Neisseria Gonorrhea: NEGATIVE

## 2017-10-06 LAB — OB RESULTS CONSOLE GBS: GBS: NEGATIVE

## 2017-10-07 LAB — STREP GP B NAA: STREP GROUP B AG: NEGATIVE

## 2017-10-11 ENCOUNTER — Encounter (HOSPITAL_COMMUNITY): Payer: Self-pay | Admitting: *Deleted

## 2017-10-11 ENCOUNTER — Inpatient Hospital Stay (EMERGENCY_DEPARTMENT_HOSPITAL)
Admission: AD | Admit: 2017-10-11 | Discharge: 2017-10-11 | Disposition: A | Discharge status: Home / Self Care | Payer: Medicaid Other | Source: Ambulatory Visit | Attending: Obstetrics and Gynecology | Admitting: Obstetrics and Gynecology

## 2017-10-11 ENCOUNTER — Other Ambulatory Visit: Payer: Self-pay

## 2017-10-11 DIAGNOSIS — O479 False labor, unspecified: Secondary | ICD-10-CM

## 2017-10-11 DIAGNOSIS — O361920 Maternal care for other isoimmunization, second trimester, not applicable or unspecified: Secondary | ICD-10-CM

## 2017-10-11 DIAGNOSIS — Z3A39 39 weeks gestation of pregnancy: Secondary | ICD-10-CM | POA: Diagnosis not present

## 2017-10-11 DIAGNOSIS — O361990 Maternal care for other isoimmunization, unspecified trimester, not applicable or unspecified: Secondary | ICD-10-CM | POA: Diagnosis not present

## 2017-10-11 DIAGNOSIS — O4703 False labor before 37 completed weeks of gestation, third trimester: Secondary | ICD-10-CM | POA: Diagnosis not present

## 2017-10-11 NOTE — MAU Note (Signed)
Contractions started last night, are now q 5 min.  No bleeding or leaking. Was 1 cm when last checked.

## 2017-10-11 NOTE — MAU Provider Note (Signed)
S:  Julie [name] is a 22 y.o. female G1P0 @ 945w1d here in MAU with contractions. Contractions comes and go. She denies bleeding or leaking of fluid.   O:  GENERAL: Well-developed, well-nourished female in no acute distress.  LUNGS: Effort normal SKIN: Warm, dry and without erythema PSYCH: Normal mood and affect  Patient Vitals for the past 24 hrs:  BP Temp Temp src Pulse Resp SpO2 Weight  10/11/17 1831 134/86 - - (!) 103 - 100 % -  10/11/17 1816 124/79 - - (!) 104 - - -  10/11/17 1804 138/86 - - (!) 104 - 100 % -  10/11/17 1746 128/79 - - (!) 105 - - -  10/11/17 1731 133/87 - - (!) 103 - - -  10/11/17 1724 138/86 - - 96 - 100 % -  10/11/17 1716 137/87 - - (!) 102 - - -  10/11/17 1707 134/81 - - (!) 106 - - -  10/11/17 1704 (!) 144/86 98.6 F (37 C) Oral (!) 104 17 100 % -  10/11/17 1657 - - - - - - 138 lb (62.6 kg)    MDM:  RN asked NP for medical screen. One abnormal BP, all others normal. Patient has an office visit tomorrow.  Fetal Tracing: Baseline: 120 bpm Variability: Moderate  Accelerations: 15x15 Decelerations: None Toco: irregular pattern.   Ok for [facility], false labor   Venia Carbonasch, Jennifer I, NP 10/11/2017 7:04 PM

## 2017-10-12 ENCOUNTER — Encounter (HOSPITAL_COMMUNITY): Payer: Self-pay

## 2017-10-12 ENCOUNTER — Encounter (HOSPITAL_COMMUNITY): Payer: Self-pay | Admitting: *Deleted

## 2017-10-12 ENCOUNTER — Ambulatory Visit (INDEPENDENT_AMBULATORY_CARE_PROVIDER_SITE_OTHER): Payer: Medicaid Other | Admitting: Certified Nurse Midwife

## 2017-10-12 ENCOUNTER — Inpatient Hospital Stay (HOSPITAL_COMMUNITY): Payer: Medicaid Other | Admitting: Anesthesiology

## 2017-10-12 ENCOUNTER — Inpatient Hospital Stay (EMERGENCY_DEPARTMENT_HOSPITAL)
Admission: AD | Admit: 2017-10-12 | Discharge: 2017-10-12 | Disposition: A | Discharge status: Home / Self Care | Payer: Medicaid Other | Source: Ambulatory Visit | Attending: Obstetrics and Gynecology | Admitting: Obstetrics and Gynecology

## 2017-10-12 ENCOUNTER — Inpatient Hospital Stay (HOSPITAL_COMMUNITY)
Admission: AD | Admit: 2017-10-12 | Discharge: 2017-10-14 | DRG: 807 | Disposition: A | Discharge status: Home / Self Care | Payer: Medicaid Other | Source: Ambulatory Visit | Attending: Obstetrics and Gynecology | Admitting: Obstetrics and Gynecology

## 2017-10-12 VITALS — BP 128/83 | HR 111 | Wt 137.0 lb

## 2017-10-12 DIAGNOSIS — O361990 Maternal care for other isoimmunization, unspecified trimester, not applicable or unspecified: Secondary | ICD-10-CM

## 2017-10-12 DIAGNOSIS — Z3A39 39 weeks gestation of pregnancy: Secondary | ICD-10-CM

## 2017-10-12 DIAGNOSIS — Z87891 Personal history of nicotine dependence: Secondary | ICD-10-CM

## 2017-10-12 DIAGNOSIS — O134 Gestational [pregnancy-induced] hypertension without significant proteinuria, complicating childbirth: Secondary | ICD-10-CM | POA: Diagnosis present

## 2017-10-12 DIAGNOSIS — Z3400 Encounter for supervision of normal first pregnancy, unspecified trimester: Secondary | ICD-10-CM

## 2017-10-12 DIAGNOSIS — O361930 Maternal care for other isoimmunization, third trimester, not applicable or unspecified: Secondary | ICD-10-CM | POA: Diagnosis present

## 2017-10-12 DIAGNOSIS — Z3403 Encounter for supervision of normal first pregnancy, third trimester: Secondary | ICD-10-CM

## 2017-10-12 DIAGNOSIS — O4703 False labor before 37 completed weeks of gestation, third trimester: Secondary | ICD-10-CM

## 2017-10-12 DIAGNOSIS — O361920 Maternal care for other isoimmunization, second trimester, not applicable or unspecified: Secondary | ICD-10-CM

## 2017-10-12 DIAGNOSIS — Z3483 Encounter for supervision of other normal pregnancy, third trimester: Secondary | ICD-10-CM | POA: Diagnosis present

## 2017-10-12 DIAGNOSIS — O479 False labor, unspecified: Secondary | ICD-10-CM

## 2017-10-12 LAB — COMPREHENSIVE METABOLIC PANEL
ALK PHOS: 205 U/L — AB (ref 38–126)
ALT: 13 U/L — AB (ref 14–54)
AST: 21 U/L (ref 15–41)
Albumin: 3.7 g/dL (ref 3.5–5.0)
Anion gap: 10 (ref 5–15)
BUN: 5 mg/dL — AB (ref 6–20)
CALCIUM: 10 mg/dL (ref 8.9–10.3)
CHLORIDE: 104 mmol/L (ref 101–111)
CO2: 22 mmol/L (ref 22–32)
CREATININE: 0.42 mg/dL — AB (ref 0.44–1.00)
GFR calc Af Amer: >60 mL/min (ref >60)
Glucose, Bld: 88 mg/dL (ref 65–99)
Potassium: 3.9 mmol/L (ref 3.5–5.1)
Sodium: 136 mmol/L (ref 135–145)
Total Bilirubin: 0.7 mg/dL (ref 0.3–1.2)
Total Protein: 7.9 g/dL (ref 6.5–8.1)

## 2017-10-12 LAB — POCT FERN TEST: POCT Fern Test: NEGATIVE

## 2017-10-12 LAB — TYPE AND SCREEN
ABO/RH(D): O POS
ANTIBODY SCREEN: NEGATIVE

## 2017-10-12 LAB — RAPID URINE DRUG SCREEN, HOSP PERFORMED
Amphetamines: NOT DETECTED
BARBITURATES: NOT DETECTED
BENZODIAZEPINES: NOT DETECTED
COCAINE: NOT DETECTED
Opiates: NOT DETECTED
TETRAHYDROCANNABINOL: NOT DETECTED

## 2017-10-12 LAB — PROTEIN / CREATININE RATIO, URINE
Creatinine, Urine: 60 mg/dL
PROTEIN CREATININE RATIO: 0.3 mg/mg{creat} — AB (ref 0.00–0.15)
TOTAL PROTEIN, URINE: 18 mg/dL

## 2017-10-12 LAB — CBC
HEMATOCRIT: 34.6 % — AB (ref 36.0–46.0)
HEMOGLOBIN: 12.1 g/dL (ref 12.0–15.0)
MCH: 31.7 pg (ref 26.0–34.0)
MCHC: 35 g/dL (ref 30.0–36.0)
MCV: 90.6 fL (ref 78.0–100.0)
Platelets: 258 10*3/uL (ref 150–400)
RBC: 3.82 MIL/uL — AB (ref 3.87–5.11)
RDW: 12.6 % (ref 11.5–15.5)
WBC: 8.6 10*3/uL (ref 4.0–10.5)

## 2017-10-12 LAB — ABO/RH: ABO/RH(D): O POS

## 2017-10-12 MED ORDER — ONDANSETRON HCL 4 MG PO TABS: 4.0000 mg | ORAL_TABLET | ORAL | Status: DC | PRN

## 2017-10-12 MED ORDER — OXYTOCIN 40 UNITS IN LACTATED RINGERS INFUSION - SIMPLE MED
2.5000 [IU]/h | INTRAVENOUS | Status: DC
  Administered 2017-10-12: 2.5 [IU]/h via INTRAVENOUS
  Filled 2017-10-12: qty 1000

## 2017-10-12 MED ORDER — PHENYLEPHRINE 40 MCG/ML (10ML) SYRINGE FOR IV PUSH (FOR BLOOD PRESSURE SUPPORT)
80.0000 ug | PREFILLED_SYRINGE | INTRAVENOUS | Status: DC | PRN
  Filled 2017-10-12: qty 5

## 2017-10-12 MED ORDER — DIPHENHYDRAMINE HCL 25 MG PO CAPS: 25.0000 mg | ORAL_CAPSULE | Freq: Four times a day (QID) | ORAL | Status: DC | PRN

## 2017-10-12 MED ORDER — ACETAMINOPHEN 325 MG PO TABS: 650.0000 mg | ORAL_TABLET | ORAL | Status: DC | PRN

## 2017-10-12 MED ORDER — OXYCODONE HCL 5 MG PO TABS: 10.0000 mg | ORAL_TABLET | ORAL | Status: DC | PRN

## 2017-10-12 MED ORDER — FENTANYL 2.5 MCG/ML BUPIVACAINE 1/10 % EPIDURAL INFUSION (WH - ANES)
14.0000 mL/h | INTRAMUSCULAR | Status: DC | PRN
  Administered 2017-10-12: 14 mL/h via EPIDURAL
  Filled 2017-10-12: qty 100

## 2017-10-12 MED ORDER — LACTATED RINGERS IV SOLN: 500.0000 mL | Freq: Once | INTRAVENOUS | Status: DC

## 2017-10-12 MED ORDER — LACTATED RINGERS IV SOLN
500.0000 mL | INTRAVENOUS | Status: DC | PRN
  Administered 2017-10-12: 500 mL via INTRAVENOUS

## 2017-10-12 MED ORDER — EPHEDRINE 5 MG/ML INJ
10.0000 mg | INTRAVENOUS | Status: DC | PRN
  Filled 2017-10-12: qty 2

## 2017-10-12 MED ORDER — FENTANYL CITRATE (PF) 100 MCG/2ML IJ SOLN
100.0000 ug | INTRAMUSCULAR | Status: DC | PRN
  Filled 2017-10-12: qty 2

## 2017-10-12 MED ORDER — MEASLES, MUMPS & RUBELLA VAC ~~LOC~~ INJ
0.5000 mL | INJECTION | Freq: Once | SUBCUTANEOUS | Status: DC
  Filled 2017-10-12: qty 0.5

## 2017-10-12 MED ORDER — COCONUT OIL OIL: 1.0000 "application " | TOPICAL_OIL | Status: DC | PRN

## 2017-10-12 MED ORDER — ONDANSETRON HCL 4 MG/2ML IJ SOLN: 4.0000 mg | Freq: Four times a day (QID) | INTRAMUSCULAR | Status: DC | PRN

## 2017-10-12 MED ORDER — SOD CITRATE-CITRIC ACID 500-334 MG/5ML PO SOLN: 30.0000 mL | ORAL | Status: DC | PRN

## 2017-10-12 MED ORDER — OXYTOCIN 10 UNIT/ML IJ SOLN: 10.0000 [IU] | Freq: Once | INTRAMUSCULAR | Status: DC

## 2017-10-12 MED ORDER — TETANUS-DIPHTH-ACELL PERTUSSIS 5-2.5-18.5 LF-MCG/0.5 IM SUSP: 0.5000 mL | Freq: Once | INTRAMUSCULAR | Status: DC

## 2017-10-12 MED ORDER — DIPHENHYDRAMINE HCL 50 MG/ML IJ SOLN: 12.5000 mg | INTRAMUSCULAR | Status: DC | PRN

## 2017-10-12 MED ORDER — LIDOCAINE HCL (PF) 1 % IJ SOLN
INTRAMUSCULAR | Status: DC | PRN
  Administered 2017-10-12: 13 mL via EPIDURAL

## 2017-10-12 MED ORDER — PRENATAL MULTIVITAMIN CH
1.0000 | ORAL_TABLET | Freq: Every day | ORAL | Status: DC
  Administered 2017-10-13 – 2017-10-14 (×2): 1 via ORAL
  Filled 2017-10-12 (×2): qty 1

## 2017-10-12 MED ORDER — OXYCODONE HCL 5 MG PO TABS: 5.0000 mg | ORAL_TABLET | ORAL | Status: DC | PRN

## 2017-10-12 MED ORDER — BISACODYL 10 MG RE SUPP
10.0000 mg | Freq: Every day | RECTAL | Status: DC | PRN
Start: 2017-10-12 — End: 2017-10-14

## 2017-10-12 MED ORDER — WITCH HAZEL-GLYCERIN EX PADS: 1.0000 "application " | MEDICATED_PAD | CUTANEOUS | Status: DC | PRN

## 2017-10-12 MED ORDER — SIMETHICONE 80 MG PO CHEW: 80.0000 mg | CHEWABLE_TABLET | ORAL | Status: DC | PRN

## 2017-10-12 MED ORDER — LIDOCAINE HCL (PF) 1 % IJ SOLN
30.0000 mL | INTRAMUSCULAR | Status: DC | PRN
  Filled 2017-10-12: qty 30

## 2017-10-12 MED ORDER — OXYTOCIN BOLUS FROM INFUSION
500.0000 mL | Freq: Once | INTRAVENOUS | Status: AC
  Administered 2017-10-12: 500 mL via INTRAVENOUS

## 2017-10-12 MED ORDER — OXYCODONE-ACETAMINOPHEN 5-325 MG PO TABS: 1.0000 | ORAL_TABLET | ORAL | Status: DC | PRN

## 2017-10-12 MED ORDER — LACTATED RINGERS IV SOLN
INTRAVENOUS | Status: DC
  Administered 2017-10-12 (×2): via INTRAVENOUS

## 2017-10-12 MED ORDER — DIBUCAINE 1 % RE OINT: 1.0000 "application " | TOPICAL_OINTMENT | RECTAL | Status: DC | PRN

## 2017-10-12 MED ORDER — ZOLPIDEM TARTRATE 5 MG PO TABS: 5.0000 mg | ORAL_TABLET | Freq: Every evening | ORAL | Status: DC | PRN

## 2017-10-12 MED ORDER — FERROUS SULFATE 325 (65 FE) MG PO TABS
325.0000 mg | ORAL_TABLET | Freq: Two times a day (BID) | ORAL | Status: DC
  Administered 2017-10-13 – 2017-10-14 (×3): 325 mg via ORAL
  Filled 2017-10-12 (×3): qty 1

## 2017-10-12 MED ORDER — METHYLERGONOVINE MALEATE 0.2 MG/ML IJ SOLN: 0.2000 mg | INTRAMUSCULAR | Status: DC | PRN

## 2017-10-12 MED ORDER — DOCUSATE SODIUM 100 MG PO CAPS
100.0000 mg | ORAL_CAPSULE | Freq: Two times a day (BID) | ORAL | Status: DC
  Administered 2017-10-13 – 2017-10-14 (×2): 100 mg via ORAL
  Filled 2017-10-12 (×3): qty 1

## 2017-10-12 MED ORDER — FLEET ENEMA 7-19 GM/118ML RE ENEM: 1.0000 | ENEMA | Freq: Every day | RECTAL | Status: DC | PRN

## 2017-10-12 MED ORDER — PHENYLEPHRINE 40 MCG/ML (10ML) SYRINGE FOR IV PUSH (FOR BLOOD PRESSURE SUPPORT)
80.0000 ug | PREFILLED_SYRINGE | INTRAVENOUS | Status: DC | PRN
  Filled 2017-10-12: qty 10
  Filled 2017-10-12: qty 5

## 2017-10-12 MED ORDER — ONDANSETRON HCL 4 MG/2ML IJ SOLN: 4.0000 mg | INTRAMUSCULAR | Status: DC | PRN

## 2017-10-12 MED ORDER — METHYLERGONOVINE MALEATE 0.2 MG PO TABS: 0.2000 mg | ORAL_TABLET | ORAL | Status: DC | PRN

## 2017-10-12 MED ORDER — BENZOCAINE-MENTHOL 20-0.5 % EX AERO: 1.0000 "application " | INHALATION_SPRAY | CUTANEOUS | Status: DC | PRN

## 2017-10-12 MED ORDER — OXYCODONE-ACETAMINOPHEN 5-325 MG PO TABS: 2.0000 | ORAL_TABLET | ORAL | Status: DC | PRN

## 2017-10-12 MED ORDER — IBUPROFEN 600 MG PO TABS
600.0000 mg | ORAL_TABLET | Freq: Four times a day (QID) | ORAL | Status: DC
  Administered 2017-10-13 – 2017-10-14 (×5): 600 mg via ORAL
  Filled 2017-10-12 (×7): qty 1

## 2017-10-12 NOTE — MAU Note (Signed)
Pt has been contracting since yesterday. Was seen in MAU on Wednesday and sent home for false labor. Is also reporting leaking "pink fluid."  +FM

## 2017-10-12 NOTE — H&P (Signed)
OBSTETRIC ADMISSION HISTORY AND PHYSICAL  Julie [name] is a 22 y.o. female G1P0 with IUP at [redacted]w[redacted]d by LMP, confirmed by u/s presenting for SOL. Reports onset of contractions prior to 0400 that have become more regular. She reports +FMs, No LOF, no VB, no blurry vision, headaches or peripheral edema, and RUQ pain.  She plans on bottle feeding. She requests more information for birth control. She received her prenatal [facility]   Dating: By LMP --->  Estimated Date of Delivery: 10/17/17  Sono: @[redacted]w[redacted]d , CWD, normal anatomy (female),  40% EFW   Prenatal History/Complications:  Past Medical History: Past Medical History:  Diagnosis Date  . GERD (gastroesophageal reflux disease)     Past Surgical History: Past Surgical History:  Procedure Laterality Date  . NO PAST SURGERIES      Obstetrical History: OB History    Gravida Para Term Preterm AB Living   1             SAB TAB Ectopic Multiple Live Births                  Social History: Social History   Socioeconomic History  . Marital status: Single    Spouse name: None  . Number of children: None  . Years of education: None  . Highest education level: None  Social Needs  . Financial resource strain: None  . Food insecurity - worry: None  . Food insecurity - inability: None  . Transportation needs - medical: None  . Transportation needs - non-medical: None  Occupational History  . None  Tobacco Use  . Smoking status: Never Smoker  . Smokeless tobacco: Never Used  Substance and Sexual Activity  . Alcohol use: No  . Drug use: No  . Sexual activity: Yes    Birth control/protection: None  Other Topics Concern  . None  Social History Narrative  . None    Family History: Family History  Problem Relation Age of Onset  . Diabetes Mother   . Pancreatic cancer Father   . Cancer Maternal Grandfather     Allergies: No Active Allergies  Medications Prior to Admission  Medication Sig Dispense Refill Last Dose   . acetaminophen (TYLENOL) 500 MG tablet Take 1,000 mg by mouth every 6 (six) hours as needed for moderate pain.   10/11/2017 at Unknown time  . prenatal vitamin w/FE, FA (PRENATAL 1 + 1) 27-1 MG TABS tablet Take 1 tablet by mouth daily. 30 each 11 09/21/2017     Review of Systems   All systems reviewed and negative except as stated in HPI  Blood pressure 138/88, pulse 92, temperature 98.3 F (36.8 C), temperature source Oral, height 5\' 5"  (1.651 m), weight 62.1 kg (137 lb), last menstrual period 01/10/2017, SpO2 100 %. General appearance: alert, cooperative, appears stated age and no distress Lungs: clear to auscultation bilaterally Heart: regular rate and rhythm Abdomen: soft, non-tender; bowel sounds normal Extremities: Homans sign is negative, no sign of DVT Presentation: cephalic Fetal monitoringBaseline: 125 bpm, Variability: Good {> 6 bpm), Accelerations: Reactive and Decelerations: Early Uterine activityFrequency: Every 4 minutes Dilation: 5.5 Effacement (%): 100 Station: -1 Exam by:: J.Cox,RN   Prenatal labs: ABO, Rh: --/--/O POS (01/10 1528) Antibody: NEG (01/10 1528) Rubella: 3.82 (07/16 0957) RPR: Non Reactive (10/15 0920)  HBsAg: Negative (07/16 0957)  HIV: Non Reactive (10/15 0920)  GBS: Negative (01/04 0000)  2 hr Glucola WNL Genetic screening neg Anatomy US WNL  Prenatal Transfer Tool  Maternal  Diabetes: No Genetic Screening: Normal Maternal Ultrasounds/Referrals: Normal Fetal Ultrasounds or other Referrals:  None Maternal Substance Abuse:  No Significant Maternal Medications:  None Significant Maternal Lab Results: Lab values include: Group B Strep negative  Results for orders placed or performed during the hospital encounter of 10/12/17 (from the past 24 hour(s))  CBC   Collection Time: 10/12/17  3:06 PM  Result Value Ref Range   WBC 8.6 4.0 - 10.5 K/uL   RBC 3.82 (L) 3.87 - 5.11 MIL/uL   Hemoglobin 12.1 12.0 - 15.0 g/dL   HCT 65.734.6 (L) 84.636.0 - 96.246.0  %   MCV 90.6 78.0 - 100.0 fL   MCH 31.7 26.0 - 34.0 pg   MCHC 35.0 30.0 - 36.0 g/dL   RDW 95.212.6 84.111.5 - 32.415.5 %   Platelets 258 150 - 400 K/uL  Comprehensive metabolic panel   Collection Time: 10/12/17  3:06 PM  Result Value Ref Range   Sodium 136 135 - 145 mmol/L   Potassium 3.9 3.5 - 5.1 mmol/L   Chloride 104 101 - 111 mmol/L   CO2 22 22 - 32 mmol/L   Glucose, Bld 88 65 - 99 mg/dL   BUN 5 (L) 6 - 20 mg/dL   Creatinine, Ser 4.010.42 (L) 0.44 - 1.00 mg/dL   Calcium 02.710.0 8.9 - 25.310.3 mg/dL   Total Protein 7.9 6.5 - 8.1 g/dL   Albumin 3.7 3.5 - 5.0 g/dL   AST 21 15 - 41 U/L   ALT 13 (L) 14 - 54 U/L   Alkaline Phosphatase 205 (H) 38 - 126 U/L   Total Bilirubin 0.7 0.3 - 1.2 mg/dL   GFR calc non Af Amer >60 >60 mL/min   GFR calc Af Amer >60 >60 mL/min   Anion gap 10 5 - 15  Urine rapid drug screen (hosp performed)[facility]   Collection Time: 10/12/17  3:06 PM  Result Value Ref Range   Opiates NONE DETECTED NONE DETECTED   Cocaine NONE DETECTED NONE DETECTED   Benzodiazepines NONE DETECTED NONE DETECTED   Amphetamines NONE DETECTED NONE DETECTED   Tetrahydrocannabinol NONE DETECTED NONE DETECTED   Barbiturates NONE DETECTED NONE DETECTED  Type and screen   Collection Time: 10/12/17  3:28 PM  Result Value Ref Range   ABO/RH(D) O POS    Antibody Screen NEG    Sample Expiration 10/15/2017   Results for orders placed or performed during the hospital encounter of 10/12/17 (from the past 24 hour(s))  Fern Test   Collection Time: 10/12/17  4:01 AM  Result Value Ref Range   POCT Fern Test Negative = intact amniotic membranes     Patient Active Problem List   Diagnosis Date Noted  . Labor with prolonged first stage 10/12/2017  . 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 Encounter for supervision of normal first pregnancy, unspecified trimester 04/17/2017    Assessment/Plan:  Julie [name] is a 22 y.o. G1P0 at 7140w2d here for SOL  #Labor: expectant management, have not  started pitocin gtt #Pain: epidural #FWB: Reactive NST #ID:  GBS -  #MOF: bottle #MOC: undecided #Circ:  n/a  Alroy BailiffParker W Leland, MD  10/12/2017, 5:33 PM  I was present for the exam and agree with above. Mildly elevated BP noted. Mild HA and one episode of blurry vision yesterday that resolved spontaneously. Pre-E labs drawn. Doubt Pre- E HA. Will CTO closely.   Katrinka BlazingSmith, [place], [place] 10/12/2017 8:27 PM

## 2017-10-12 NOTE — MAU Provider Note (Signed)
Chief Complaint:  Labor Eval   Provider saw patient at 0440    HPI  HPI: Julie [name] is a 22 y.o. G1P0 at 23w2dwho presents to maternity admissions reporting painful uterine contractions.  RN has done a labor check on her. I was asked to discharge her for the physician and I reviewed her NST.Marland Kitchen She reports good fetal movement, denies LOF, vaginal bleeding, vaginal itching/burning, urinary symptoms, h/a, dizziness, n/v, diarrhea, constipation or fever/chills.     Past Medical History: Past Medical History:  Diagnosis Date  . GERD (gastroesophageal reflux disease)     Past obstetric history: OB History  Gravida Para Term Preterm AB Living  1            SAB TAB Ectopic Multiple Live Births               # Outcome Date GA Lbr Len/2nd Weight Sex Delivery Anes PTL Lv  1 Current               Past Surgical History: Past Surgical History:  Procedure Laterality Date  . NO PAST SURGERIES      Family History: Family History  Problem Relation Age of Onset  . Diabetes Mother   . Pancreatic cancer Father   . Cancer Maternal Grandfather     Social History: Social History   Tobacco Use  . Smoking status: Former Games developer  . Smokeless tobacco: Never Used  Substance Use Topics  . Alcohol use: No  . Drug use: No    Allergies: No Known Allergies  Meds:  Medications Prior to Admission  Medication Sig Dispense Refill Last Dose  . prenatal vitamin w/FE, FA (PRENATAL 1 + 1) 27-1 MG TABS tablet Take 1 tablet by mouth daily. 30 each 11 Taking    I have reviewed patient's Past Medical Hx, Surgical Hx, Family Hx, Social Hx, medications and allergies.   ROS:  Review of Systems Other systems negative  Physical Exam   Patient Vitals for the past 24 hrs:  BP Temp Temp src Pulse Resp SpO2 Weight  10/12/17 0426 - - - - - 100 % -  10/12/17 0423 133/85 - - 93 - - -  10/12/17 0421 - - - - - 100 % -  10/12/17 0415 - - - 91 - - -  10/12/17 0400 127/84 - - (!) 104 - - -  10/12/17  0353 - - - - - 100 % -  10/12/17 0351 (!) 122/91 - - 100 - - -  10/12/17 0348 - - - - - 100 % -  10/12/17 0343 - - - - - 99 % -  10/12/17 0338 - - - - - 99 % -  10/12/17 0333 - - - - - 100 % -  10/12/17 0328 - - - - - 99 % -  10/12/17 0323 - - - - - 100 % -  10/12/17 0318 (!) 148/92 98 F (36.7 C) Oral (!) 111 16 100 % -  10/12/17 0311 - - - - - - 138 lb 1.9 oz (62.7 kg)   Cervical exam by RN Dilation: 1.5 Effacement (%): 90 Cervical Position: Anterior Exam by:: Bari Mantis RN   FHT:  Baseline 140 , moderate variability, accelerations present, no decelerations, categoryI Contractions:  Irregular    Labs: Results for orders placed or performed during the hospital encounter of 10/12/17 (from the past 24 hour(s))  Fern Test     Status: Normal   Collection Time: 10/12/17  4:01  AM  Result Value Ref Range   POCT Fern Test Negative = intact amniotic membranes    O/Positive/-- (07/16 0957)  Imaging:  No results found.  MAU Course/MDM: NST reviewed, category I RN did labor evaluation  Assessment: 1. False labor   2. 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     Category I FHR tracing  Plan: Discharge home Labor precautions and fetal kick counts Follow up in Office for prenatal visits and recheck   Pt stable at time of discharge.  Wynelle BourgeoisMarie Woodruff Skirvin CNM, MSN Certified Nurse-Midwife 10/12/2017 4:40 AM

## 2017-10-12 NOTE — Progress Notes (Signed)
Alerted nursery that baby is going to bottle feed and is ready for a bottle.

## 2017-10-12 NOTE — Anesthesia Procedure Notes (Signed)
Epidural Patient location during procedure: OB Start time: 10/12/2017 5:02 PM End time: 10/12/2017 5:27 PM  Staffing Anesthesiologist: Lowella CurbMiller, Jiselle Sheu Ray, MD Performed: anesthesiologist   Preanesthetic Checklist Completed: patient identified, site marked, surgical consent, pre-op evaluation, timeout performed, IV checked, risks and benefits discussed and monitors and equipment checked  Epidural Patient position: sitting Prep: ChloraPrep Patient monitoring: heart rate, cardiac monitor, continuous pulse ox and blood pressure Approach: midline Location: L2-L3 Injection technique: LOR saline  Needle:  Needle type: Tuohy  Needle gauge: 17 G Needle length: 9 cm Needle insertion depth: 6 cm Catheter type: closed end flexible Catheter size: 20 Guage Catheter at skin depth: 10 cm Test dose: negative  Assessment Events: blood not aspirated, injection not painful, no injection resistance, negative IV test and no paresthesia  Additional Notes Reason for block:procedure for pain

## 2017-10-12 NOTE — Anesthesia Pain Management Evaluation Note (Signed)
  CRNA Pain Management Visit Note  Patient: Julie [name], 22 y.o., female  "Hello I am a [facility] of the anesthesia team at [facility]. We have an anesthesia team available at all times to provide care throughout the hospital, including epidural management and anesthesia for C-section. I don't know your plan for the delivery whether it a natural birth, water birth, IV sedation, nitrous supplementation, doula or epidural, but we want to meet your pain goals."   1.Was your pain managed to your expectations on prior hospitalizations?   No prior hospitalizations  2.What is your expectation for pain management during this hospitalization?     Epidural  3.How can we help you reach that goal? Maintain epidural.  Record the patient's initial score and the patient's pain goal.   Pain: 0  Pain Goal: 4 [facility] to be able to say your pain was always managed very well.  Sofie Schendel 10/12/2017

## 2017-10-12 NOTE — Anesthesia Preprocedure Evaluation (Signed)
Anesthesia Evaluation  Patient identified by MRN, date of birth, ID band Patient awake    Reviewed: Allergy & Precautions, NPO status , Patient's Chart, lab work & pertinent test results  Airway Mallampati: II  TM Distance: >3 FB Neck ROM: Full    Dental no notable dental hx.    Pulmonary neg pulmonary ROS,    Pulmonary exam normal breath sounds clear to auscultation       Cardiovascular negative cardio ROS Normal cardiovascular exam Rhythm:Regular Rate:Normal     Neuro/Psych negative neurological ROS  negative psych ROS   GI/Hepatic negative GI ROS, Neg liver ROS, GERD  ,  Endo/Other  negative endocrine ROS  Renal/GU negative Renal ROS  negative genitourinary   Musculoskeletal negative musculoskeletal ROS (+)   Abdominal   Peds negative pediatric ROS (+)  Hematology negative hematology ROS (+)   Anesthesia Other Findings   Reproductive/Obstetrics negative OB ROS (+) Pregnancy                             Anesthesia Physical Anesthesia Plan  ASA: II  Anesthesia Plan: Epidural   Post-op Pain Management:    Induction:   PONV Risk Score and Plan:   Airway Management Planned:   Additional Equipment:   Intra-op Plan:   Post-operative Plan:   Informed Consent:   Plan Discussed with:   Anesthesia Plan Comments:         Anesthesia Quick Evaluation

## 2017-10-12 NOTE — Progress Notes (Signed)
Pt reports scant-pink vag bleeding, ctx Q 5 min, and lots of vag/rect pressure. [facility]

## 2017-10-12 NOTE — Progress Notes (Signed)
Vitals:   10/12/17 1930 10/12/17 2001  BP: (!) 144/90 (!) 143/88  Pulse: 97 (!) 109  Resp:    Temp:    SpO2:     AROM light mec  Cx C/C/+2. No c/o pressure. FHR Cat 1.  Labor down.

## 2017-10-12 NOTE — Discharge Instructions (Signed)
Braxton Hicks Contractions °Contractions of the uterus can occur throughout pregnancy, but they are not always a sign that you are in labor. You may have practice contractions called Braxton Hicks contractions. These false labor contractions are sometimes confused with true labor. °What are Braxton Hicks contractions? °Braxton Hicks contractions are tightening movements that occur in the muscles of the uterus before labor. Unlike true labor contractions, these contractions do not result in opening (dilation) and thinning of the cervix. Toward the end of pregnancy (32-34 weeks), Braxton Hicks contractions can happen more often and may become stronger. These contractions are sometimes difficult to tell apart from true labor because they can be very uncomfortable. You should not feel embarrassed if you go to the hospital with false labor. °Sometimes, the only way to tell if you are in true labor is for your health care provider to look for changes in the cervix. The health care provider will do a physical exam and may monitor your contractions. If you are not in true labor, the exam should show that your cervix is not dilating and your water has not broken. °If there are other health problems associated with your pregnancy, it is completely safe for you to be sent home with false labor. You may continue to have Braxton Hicks contractions until you go into true labor. °How to tell the difference between true labor and false labor °True labor °· Contractions last 30-70 seconds. °· Contractions become very regular. °· Discomfort is usually felt in the top of the uterus, and it spreads to the lower abdomen and low back. °· Contractions do not go away with walking. °· Contractions usually become more intense and increase in frequency. °· The cervix dilates and gets thinner. °False labor °· Contractions are usually shorter and not as strong as true labor contractions. °· Contractions are usually irregular. °· Contractions  are often felt in the front of the lower abdomen and in the groin. °· Contractions may go away when you walk around or change positions while lying down. °· Contractions get weaker and are shorter-lasting as time goes on. °· The cervix usually does not dilate or become thin. °Follow these instructions at home: °· Take over-the-counter and prescription medicines only as told by your health care provider. °· Keep up with your usual exercises and follow other instructions from your health care provider. °· Eat and drink lightly if you think you are going into labor. °· If Braxton Hicks contractions are making you uncomfortable: °? Change your position from lying down or resting to walking, or change from walking to resting. °? Sit and rest in a tub of warm water. °? Drink enough fluid to keep your urine pale yellow. Dehydration may cause these contractions. °? Do slow and deep breathing several times an hour. °· Keep all follow-up prenatal visits as told by your health care provider. This is important. °Contact a health care provider if: °· You have a fever. °· You have continuous pain in your abdomen. °Get help right away if: °· Your contractions become stronger, more regular, and closer together. °· You have fluid leaking or gushing from your vagina. °· You pass blood-tinged mucus (bloody show). °· You have bleeding from your vagina. °· You have low back pain that you never had before. °· You feel your baby’s head pushing down and causing pelvic pressure. °· Your baby is not moving inside you as much as it used to. °Summary °· Contractions that occur before labor are called Braxton   Hicks contractions, false labor, or practice contractions. °· Braxton Hicks contractions are usually shorter, weaker, farther apart, and less regular than true labor contractions. True labor contractions usually become progressively stronger and regular and they become more frequent. °· Manage discomfort from Braxton Hicks contractions by  changing position, resting in a warm bath, drinking plenty of water, or practicing deep breathing. °This information is not intended to replace advice given to you by your health care provider. Make sure you discuss any questions you have with your health care provider. °Document Released: 02/02/2017 Document Revised: 02/02/2017 Document Reviewed: 02/02/2017 °Elsevier Interactive Patient Education © 2018 [employer] ° °

## 2017-10-12 NOTE — MAU Note (Signed)
I have communicated with Dr Rana SnareLowe and reviewed vital signs:  Vitals:   10/12/17 0423 10/12/17 0426  BP: 133/85   Pulse: 93   Resp:    Temp:    SpO2:  100%    Vaginal exam:  Dilation: 1.5 Effacement (%): 90 Cervical Position: Anterior Exam by:: Bari Mantis. Dasean Brow RN ,   Also reviewed contraction pattern and that non-stress test is reactive.  It has been documented that patient is contracting irregularly with no cervical change over 9 hours  not indicating active labor.  Patient denies any other complaints.  Based on this report provider has given order for discharge.  A discharge order and diagnosis entered by a provider.   Labor discharge instructions reviewed with patient.

## 2017-10-13 ENCOUNTER — Other Ambulatory Visit: Payer: Self-pay

## 2017-10-13 ENCOUNTER — Encounter (HOSPITAL_COMMUNITY): Payer: Self-pay | Admitting: *Deleted

## 2017-10-13 LAB — RPR: RPR: NONREACTIVE

## 2017-10-13 MED ORDER — MEDROXYPROGESTERONE ACETATE 150 MG/ML IM SUSP
150.0000 mg | Freq: Once | INTRAMUSCULAR | Status: AC
  Administered 2017-10-14: 150 mg via INTRAMUSCULAR
  Filled 2017-10-13: qty 1

## 2017-10-13 NOTE — Progress Notes (Signed)
POSTPARTUM PROGRESS NOTE  Post Partum Day 1  Subjective:  Julie [name] is a 22 y.o. G1P1001 s/p SVD at 5268w2d.  No acute events overnight.  Pt denies problems with ambulating, voiding or po intake.  She denies nausea or vomiting.  Pain is well controlled.  She has had flatus. She has not had bowel movement.  Lochia Small.   Objective: Blood pressure 130/88, pulse 95, temperature 98.8 F (37.1 C), temperature source Axillary, resp. rate 14, height 5\' 5"  (1.651 m), weight 58.5 kg (129 lb), last menstrual period 01/10/2017, SpO2 98 %, unknown if currently breastfeeding.  Physical Exam:  General: alert, cooperative and no distress Chest: no respiratory distress Heart:regular rate, distal pulses intact Abdomen: soft, nontender,  Uterine Fundus: firm, appropriately tender DVT Evaluation: No calf swelling or tenderness Extremities: trace edema Skin: warm, dry;  Recent Labs    10/12/17 1506  HGB 12.1  HCT 34.6*    Assessment/Plan: Julie [name] is a 22 y.o. G1P1001 s/p SVD at 7268w2d   PPD#1 - Doing well. Elevated BP's yesterday, PCR 0.3. Contraception: depo Feeding: breast Dispo: Plan for discharge tomorrow.   LOS: 1 day   Alroy BailiffParker W Makaria Poarch, MD 10/13/2017, 7:50 AM

## 2017-10-13 NOTE — Anesthesia Postprocedure Evaluation (Signed)
Anesthesia Post Note  Patient: Julie [name]  Procedure(s) Performed: AN AD HOC LABOR EPIDURAL     Patient location during evaluation: Mother Baby Anesthesia Type: Epidural Level of consciousness: awake and alert Pain management: pain level controlled Vital Signs Assessment: post-procedure vital signs reviewed and stable Respiratory status: spontaneous breathing, nonlabored ventilation and respiratory function stable Cardiovascular status: stable Postop Assessment: no headache, no backache, epidural receding, no apparent nausea or vomiting, patient able to bend at knees and adequate PO intake Anesthetic complications: no    Last Vitals:  Vitals:   10/13/17 0343 10/13/17 0823  BP: 130/88 132/83  Pulse: 95 91  Resp:  16  Temp:  36.8 C  SpO2: 98% 100%    Last Pain:  Vitals:   10/13/17 1200  TempSrc:   PainSc: 0-No pain   Pain Goal:                 Land O'LakesMalinova,Rajiv Parlato Hristova

## 2017-10-14 MED ORDER — IBUPROFEN 600 MG PO TABS: 600.0000 mg | ORAL_TABLET | Freq: Four times a day (QID) | ORAL | 0 refills | Status: DC

## 2017-10-14 NOTE — Discharge Instructions (Signed)
Vaginal Delivery, Care After °Refer to this sheet in the next few weeks. These instructions provide you with information about caring for yourself after vaginal delivery. Your health care provider may also give you more specific instructions. Your treatment has been planned according to current medical practices, but problems sometimes occur. Call your health care provider if you have any problems or questions. °What can I expect after the procedure? °After vaginal delivery, it is common to have: °· Some bleeding from your vagina. °· Soreness in your abdomen, your vagina, and the area of skin between your vaginal opening and your anus (perineum). °· Pelvic cramps. °· Fatigue. ° °Follow these instructions at home: °Medicines °· Take over-the-counter and prescription medicines only as told by your health care provider. °· If you were prescribed an antibiotic medicine, take it as told by your health care provider. Do not stop taking the antibiotic until it is finished. °Driving ° °· Do not drive or operate heavy machinery while taking prescription pain medicine. °· Do not drive for 24 hours if you received a sedative. °Lifestyle °· Do not drink alcohol. This is especially important if you are breastfeeding or taking medicine to relieve pain. °· Do not use tobacco products, including cigarettes, chewing tobacco, or e-cigarettes. If you need help quitting, ask your health care provider. °Eating and drinking °· Drink at least 8 eight-ounce glasses of water every day unless you are told not to by your health care provider. If you choose to breastfeed your baby, you may need to drink more water than this. °· Eat high-fiber foods every day. These foods may help prevent or relieve constipation. High-fiber foods include: °? Whole grain cereals and breads. °? Brown rice. °? Beans. °? Fresh fruits and vegetables. °Activity °· Return to your normal activities as told by your health care provider. Ask your health care provider  what activities are safe for you. °· Rest as much as possible. Try to rest or take a nap when your baby is sleeping. °· Do not lift anything that is heavier than your baby or 10 lb (4.5 kg) until your health care provider says that it is safe. °· Talk with your health care provider about when you can engage in sexual activity. This may depend on your: °? Risk of infection. °? Rate of healing. °? Comfort and desire to engage in sexual activity. °Vaginal Care °· If you have an episiotomy or a vaginal tear, check the area every day for signs of infection. Check for: °? More redness, swelling, or pain. °? More fluid or blood. °? Warmth. °? Pus or a bad smell. °· Do not use tampons or douches until your health care provider says this is safe. °· Watch for any blood clots that may pass from your vagina. These may look like clumps of dark red, brown, or black discharge. °General instructions °· Keep your perineum clean and dry as told by your health care provider. °· Wear loose, comfortable clothing. °· Wipe from front to back when you use the toilet. °· Ask your health care provider if you can shower or take a bath. If you had an episiotomy or a perineal tear during labor and delivery, your health care provider may tell you not to take baths for a certain length of time. °· Wear a bra that supports your breasts and fits you well. °· If possible, have someone help you with household activities and help care for your baby for at least a few days after   you leave the hospital. °· Keep all follow-up visits for you and your baby as told by your health care provider. This is important. °Contact a health care provider if: °· You have: °? Vaginal discharge that has a bad smell. °? Difficulty urinating. °? Pain when urinating. °? A sudden increase or decrease in the frequency of your bowel movements. °? More redness, swelling, or pain around your episiotomy or vaginal tear. °? More fluid or blood coming from your episiotomy or  vaginal tear. °? Pus or a bad smell coming from your episiotomy or vaginal tear. °? A fever. °? A rash. °? Little or no interest in activities you used to enjoy. °? Questions about caring for yourself or your baby. °· Your episiotomy or vaginal tear feels warm to the touch. °· Your episiotomy or vaginal tear is separating or does not appear to be healing. °· Your breasts are painful, hard, or turn red. °· You feel unusually sad or worried. °· You feel nauseous or you vomit. °· You pass large blood clots from your vagina. If you pass a blood clot from your vagina, save it to show to your health care provider. Do not flush blood clots down the toilet without having your health care provider look at them. °· You urinate more than usual. °· You are dizzy or light-headed. °· You have not breastfed at all and you have not had a menstrual period for 12 weeks after delivery. °· You have stopped breastfeeding and you have not had a menstrual period for 12 weeks after you stopped breastfeeding. °Get help right away if: °· You have: °? Pain that does not go away or does not get better with medicine. °? Chest pain. °? Difficulty breathing. °? Blurred vision or spots in your vision. °? Thoughts about hurting yourself or your baby. °· You develop pain in your abdomen or in one of your legs. °· You develop a severe headache. °· You faint. °· You bleed from your vagina so much that you fill two sanitary pads in one hour. °This information is not intended to replace advice given to you by your health care provider. Make sure you discuss any questions you have with your health care provider. °Document Released: 09/16/2000 Document Revised: 03/02/2016 Document Reviewed: 10/04/2015 °Elsevier Interactive Patient Education © 2018 [employer] ° °

## 2017-10-14 NOTE — Discharge Summary (Signed)
OB Discharge Summary     Patient Name: Julie [name] DOB: Oct 23, 1995 MRN: [id number]  Date of admission: 10/12/2017 Delivering MD: Jacklyn Shell   Date of discharge: 10/14/2017  Admitting diagnosis: LABOR Intrauterine pregnancy: [redacted]w[redacted]d     Secondary diagnosis:  Principal Problem:   Vaginal delivery Active Problems:   Labor with prolonged first stage  Additional problems: positive anti-Lewis A  Patient Active Problem List   Diagnosis Date Noted  . Labor with prolonged first stage 10/12/2017  . Vaginal delivery 10/12/2017  . 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 Encounter for supervision of normal first pregnancy, unspecified trimester 04/17/2017     Discharge diagnosis: Term Pregnancy Delivered and Gestational Hypertension                                                                                               Post partum procedures:none  Augmentation: AROM  Complications: None  Hospital course:  Onset of Labor With Vaginal Delivery     22 y.o. yo G1P1001 at [redacted]w[redacted]d was admitted in Latent Labor on 10/12/2017. Patient had an uncomplicated labor course as follows:  Membrane Rupture Time/Date: 7:35 PM ,10/12/2017   Intrapartum Procedures: Episiotomy: None [1]                                         Lacerations:  None [1]  Patient had a delivery of a Viable infant. 10/12/2017  Information for the patient's newborn:  Dede, Dobesh [[id number]]  Delivery Method: Vaginal, Spontaneous(Filed from Delivery Summary)    Pateint had an uncomplicated postpartum course.  She is ambulating, tolerating a regular diet, passing flatus, and urinating well. BPs stable at time of discharge; not on medications. Patient is discharged home in stable condition on 10/14/17.   Physical exam  Vitals:   10/13/17 0529 10/13/17 0823 10/13/17 1851 10/14/17 0550  BP:  132/83 119/83 119/67  Pulse:  91 93 91  Resp:  16 18 16   Temp:  98.2 F (36.8 C) 98.4 F (36.9 C)  98.4 F (36.9 C)  TempSrc:  Oral Oral Oral  SpO2:  100%  100%  Weight: 58.5 kg (129 lb)   57.6 kg (127 lb)  Height:       General: alert, cooperative and no distress Lochia: appropriate Uterine Fundus: firm Incision: N/A DVT Evaluation: No evidence of DVT seen on physical exam. Labs: Lab Results  Component Value Date   WBC 8.6 10/12/2017   HGB 12.1 10/12/2017   HCT 34.6 (L) 10/12/2017   MCV 90.6 10/12/2017   PLT 258 10/12/2017   CMP Latest Ref Rng & Units 10/12/2017  Glucose 65 - 99 mg/dL 88  BUN 6 - 20 mg/dL 5(L)  Creatinine 6.29 - 1.00 mg/dL 5.28(U)  Sodium 132 - 440 mmol/L 136  Potassium 3.5 - 5.1 mmol/L 3.9  Chloride 101 - 111 mmol/L 104  CO2 22 - 32 mmol/L 22  Calcium 8.9 - 10.3 mg/dL 10.2  Total Protein 6.5 - 8.1 g/dL 7.9  Total Bilirubin  0.3 - 1.2 mg/dL 0.7  Alkaline Phos 38 - 126 U/L 205(H)  AST 15 - 41 U/L 21  ALT 14 - 54 U/L 13(L)    Discharge instruction: per After Visit Summary and "Baby and Me Booklet".  After visit meds:  Allergies as of 10/14/2017   No Known Allergies     Medication List    STOP taking these medications   prenatal vitamin w/FE, FA 27-1 MG Tabs tablet     TAKE these medications   acetaminophen 500 MG tablet Commonly known as:  TYLENOL Take 1,000 mg by mouth every 6 (six) hours as needed for moderate pain.   ibuprofen 600 MG tablet Commonly known as:  ADVIL,MOTRIN Take 1 tablet (600 mg total) by mouth every 6 (six) hours.       Diet: routine diet  Activity: Advance as tolerated. Pelvic rest for 6 weeks.   Outpatient follow up:4 weeks Follow up Appt: Future Appointments  Date Time Provider Department Center  11/09/2017  2:30 PM Orvilla Cornwallenney, Rachelle A, CNM [facility] None   Follow up Visit:No Follow-up on file.  Postpartum contraception: Depo Provera  Newborn Data: Live born female  Birth Weight: 6 lb 1.4 oz (2761 g) APGAR: 9, 9  Newborn Delivery   Birth date/time:  10/12/2017 20:41:00 Delivery type:  Vaginal,  Spontaneous     Baby Feeding: Breast and Bottle Disposition:home with mother   10/14/2017 Freddrick MarchYashika Amin, MD  OB FELLOW DISCHARGE ATTESTATION  I have seen and examined this patient. I agree with above documentation and have made edits as needed.   Caryl AdaJazma Anakaren Campion, DO OB Fellow

## 2017-10-14 NOTE — Lactation Note (Signed)
Lactation Consultation Note  Patient Name: Julie SanderShanteka [name] ZOXWR'UToday's Date: 10/14/2017 Reason for consult: Initial assessment;Other (Comment)(since the baby is in NICFU , LC visited mom / and mom comfirmed just wants to formula feed )  Baby is 7519 hours old  LC reviewed with mom how to dry up milk if it comes in.     Maternal Data    Feeding    LATCH Score                   Interventions    Lactation Tools Discussed/Used     Consult Status Consult Status: Complete    Kathrin GreathouseMargaret Ann Avya [name] 10/14/2017, 8:51 AM

## 2017-10-16 NOTE — Progress Notes (Signed)
   PRENATAL VISIT NOTE  Subjective:  Julie [name] is a 22 y.o. G1P1001 at 3228w2d being seen today for ongoing prenatal care.  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  Patient reports contractions since yesturday every 5 minutes apart reports light spotting.  Contractions: Regular. Vag. Bleeding: Scant.  Movement: Present. Denies leaking of fluid.   The following portions of the patient's history were reviewed and updated as appropriate: allergies, current medications, past family history, past medical history, past social history, past surgical history and problem list. Problem list updated.  Objective:   Vitals:   10/12/17 1405  BP: 128/83  Pulse: (!) 111  Weight: 137 lb (62.1 kg)    Fetal Status: Fetal Heart Rate (bpm): 145; doppler Fundal Height: 35 cm Movement: Present  Presentation: Vertex  General:  Alert, oriented and cooperative. Patient is in no acute distress.  Skin: Skin is warm and dry. No rash noted.   Cardiovascular: Normal heart rate noted  Respiratory: Normal respiratory effort, no problems with respiration noted  Abdomen: Soft, gravid, appropriate for gestational age.  Pain/Pressure: Present     Pelvic: Cervical exam performed Dilation: 4.5 Effacement (%): 70 Station: -2  Extremities: Normal range of motion.  Edema: None  Mental Status:  Normal mood and affect. Normal behavior. Normal judgment and thought content.   Assessment and Plan:  Pregnancy: G1P1001 at 6628w2d  1. Encounter for supervision of normal first pregnancy, unspecified trimester     Doing well.  In labor.  Sent to the hospital for direct admission and delivery anticipated.    Term labor symptoms and general obstetric precautions including but not limited to vaginal  bleeding, contractions, leaking of fluid and fetal movement were reviewed in detail with the patient. Please refer to After Visit Summary for other counseling recommendations.  Return in about 4 weeks (around 11/09/2017) for Postpartum.   Julie [name], CNM

## 2017-10-19 ENCOUNTER — Encounter: Payer: Medicaid Other | Admitting: Certified Nurse Midwife

## 2017-11-05 ENCOUNTER — Other Ambulatory Visit: Payer: Self-pay

## 2017-11-05 ENCOUNTER — Encounter (HOSPITAL_COMMUNITY): Payer: Self-pay | Admitting: *Deleted

## 2017-11-05 ENCOUNTER — Ambulatory Visit (HOSPITAL_COMMUNITY)
Admission: EM | Admit: 2017-11-05 | Discharge: 2017-11-05 | Disposition: A | Discharge status: Home / Self Care | Payer: Medicaid Other | Attending: Family Medicine | Admitting: Family Medicine

## 2017-11-05 DIAGNOSIS — K2900 Acute gastritis without bleeding: Secondary | ICD-10-CM | POA: Diagnosis not present

## 2017-11-05 MED ORDER — OMEPRAZOLE 20 MG PO CPDR: 20.0000 mg | DELAYED_RELEASE_CAPSULE | Freq: Every day | ORAL | 1 refills | Status: DC

## 2017-11-05 MED ORDER — ONDANSETRON 8 MG PO TBDP: 8.0000 mg | ORAL_TABLET | Freq: Three times a day (TID) | ORAL | 0 refills | Status: DC | PRN

## 2017-11-05 NOTE — ED Triage Notes (Signed)
Describes epigastric pain with nausea since giving birth 10/12/17.  Reports having one episode of vomiting QOD over past week.  Denies fevers.  Not breastfeeding.  Has not had f/u with OB; was told to make appt - pt states calling tomorrow for appt.

## 2017-11-05 NOTE — ED Provider Notes (Signed)
[facility]   [id number] 11/05/17 Arrival Time: 1737   SUBJECTIVE:  Julie [name] is a 22 y.o. female who presents to the urgent care with complaint of nausea and epigastric pain, intermittently for three days.    She is 20 days post partum.  No vaginal discharge or bleeding.  Bottle feeding     Past Medical History:  Diagnosis Date  . GERD (gastroesophageal reflux disease)    Family History  Problem Relation Age of Onset  . Diabetes Mother   . Pancreatic cancer Father   . Cancer Maternal Grandfather    Social History   Socioeconomic History  . Marital status: Single    Spouse name: Not on file  . Number of children: Not on file  . Years of education: Not on file  . Highest education level: Not on file  Social Needs  . Financial resource strain: Not on file  . Food insecurity - worry: Not on file  . Food insecurity - inability: Not on file  . Transportation needs - medical: Not on file  . Transportation needs - non-medical: Not on file  Occupational History  . Not on file  Tobacco Use  . Smoking status: Never Smoker  . Smokeless tobacco: Never Used  Substance and Sexual Activity  . Alcohol use: No  . Drug use: No  . Sexual activity: Not on file    Comment: pt is 3 wks postpartum  Other Topics Concern  . Not on file  Social History Narrative  . Not on file   Current Meds  Medication Sig  . Prenatal Multivit-Min-Fe-FA (PRENATAL VITAMINS PO) Take by mouth.   No Known Allergies    ROS: As per HPI, remainder of ROS negative.   OBJECTIVE:   Vitals:   11/05/17 1801  BP: 131/82  Pulse: 92  Resp: 14  Temp: 98.6 F (37 C)  TempSrc: Oral  SpO2: 99%     General appearance: alert; no distress Eyes: PERRL; EOMI; conjunctiva normal HENT: normocephalic; atraumatic;  oral mucosa normal Neck: supple Lungs: clear to auscultation bilaterally Heart: regular rate and rhythm Abdomen: soft, non-tender; bowel sounds normal; no masses or  organomegaly; no guarding or rebound tenderness Back: no CVA tenderness Extremities: no cyanosis or edema; symmetrical with no gross deformities Skin: warm and dry Neurologic: normal gait; grossly normal Psychological: alert and cooperative; normal mood and affect      Labs:  Results for orders placed or performed during the hospital encounter of 10/12/17  OB RESULT CONSOLE Group B Strep  Result Value Ref Range   GBS Negative   OB RESULT CONSOLE Group B Strep  Result Value Ref Range   GBS Negative   CBC  Result Value Ref Range   WBC 8.6 4.0 - 10.5 K/uL   RBC 3.82 (L) 3.87 - 5.11 MIL/uL   Hemoglobin 12.1 12.0 - 15.0 g/dL   HCT 40.9 (L) 81.1 - 91.4 %   MCV 90.6 78.0 - 100.0 fL   MCH 31.7 26.0 - 34.0 pg   MCHC 35.0 30.0 - 36.0 g/dL   RDW 78.2 95.6 - 21.3 %   Platelets 258 150 - 400 K/uL  Comprehensive metabolic panel  Result Value Ref Range   Sodium 136 135 - 145 mmol/L   Potassium 3.9 3.5 - 5.1 mmol/L   Chloride 104 101 - 111 mmol/L   CO2 22 22 - 32 mmol/L   Glucose, Bld 88 65 - 99 mg/dL   BUN 5 (L) 6 - 20  mg/dL   Creatinine, Ser 1.610.42 (L) 0.44 - 1.00 mg/dL   Calcium 09.610.0 8.9 - 04.510.3 mg/dL   Total Protein 7.9 6.5 - 8.1 g/dL   Albumin 3.7 3.5 - 5.0 g/dL   AST 21 15 - 41 U/L   ALT 13 (L) 14 - 54 U/L   Alkaline Phosphatase 205 (H) 38 - 126 U/L   Total Bilirubin 0.7 0.3 - 1.2 mg/dL   GFR calc non Af Amer >60 >60 mL/min   GFR calc Af Amer >60 >60 mL/min   Anion gap 10 5 - 15  RPR  Result Value Ref Range   RPR Ser Ql Non Reactive Non Reactive  Urine rapid drug screen (hosp performed)[facility]  Result Value Ref Range   Opiates NONE DETECTED NONE DETECTED   Cocaine NONE DETECTED NONE DETECTED   Benzodiazepines NONE DETECTED NONE DETECTED   Amphetamines NONE DETECTED NONE DETECTED   Tetrahydrocannabinol NONE DETECTED NONE DETECTED   Barbiturates NONE DETECTED NONE DETECTED  Protein / creatinine ratio, urine  Result Value Ref Range   Creatinine, Urine 60.00 mg/dL    Total Protein, Urine 18 mg/dL   Protein Creatinine Ratio 0.30 (H) 0.00 - 0.15 mg/mg[Cre]  Type and screen  Result Value Ref Range   ABO/RH(D) O POS    Antibody Screen NEG    Sample Expiration 10/15/2017   ABO/Rh  Result Value Ref Range   ABO/RH(D) O POS     Labs Reviewed - No data to display  No results found.     ASSESSMENT & PLAN:  1. Acute superficial gastritis without hemorrhage     Meds ordered this encounter  Medications  . omeprazole (PRILOSEC) 20 MG capsule    Sig: Take 1 capsule (20 mg total) by mouth daily.    Dispense:  21 capsule    Refill:  1  . ondansetron (ZOFRAN-ODT) 8 MG disintegrating tablet    Sig: Take 1 tablet (8 mg total) by mouth every 8 (eight) hours as needed for nausea.    Dispense:  12 tablet    Refill:  0    Reviewed expectations re: course of current medical issues. Questions answered. Outlined signs and symptoms indicating need for more acute intervention. Patient verbalized understanding. After Visit Summary given.    Procedures:      Elvina SidleLauenstein, Taysia Rivere, MD 11/05/17 [phone]

## 2017-11-09 ENCOUNTER — Ambulatory Visit: Payer: Medicaid Other | Admitting: Certified Nurse Midwife

## 2017-11-09 NOTE — Progress Notes (Deleted)
Post Partum Exam  Julie [name] is a 22 y.o. 101P1001 female who presents for a postpartum visit. She is 4 weeks postpartum following a spontaneous vaginal delivery. I have fully reviewed the prenatal and intrapartum course. The delivery was at 39 2/7 gestational weeks.  Anesthesia: epidural. Postpartum course has been ***. Baby's course has been ***. Baby is feeding by {breast/bottle:69}. Bleeding {vag bleed:12292}. Bowel function is {normal:32111}. Bladder function is {normal:32111}. Patient {is/is not:9024} sexually active. Contraception method is {contraceptive method:5051}. Postpartum depression screening:   {Common ambulatory SmartLinks:19316}  Review of Systems {ros; complete:30496}    Objective:  not currently breastfeeding.  General:  {gen appearance:16600}   Breasts:  {breast exam:1202::"inspection negative, no nipple discharge or bleeding, no masses or nodularity palpable"}  Lungs: {lung exam:16931}  Heart:  {heart exam:5510}  Abdomen: {abdomen exam:16834}   Vulva:  {labia exam:12198}  Vagina: {vagina exam:12200}  Cervix:  {cervix exam:14595}  Corpus: {uterus exam:12215}  Adnexa:  {adnexa exam:12223}  Rectal Exam: {rectal/vaginal exam:12274}        Assessment:    *** postpartum exam. Pap smear not done at today's visit.   Plan:   1. Contraception: {method:5051} 2. *** 3. Follow up in: {1-10:13787} {time; units:19136} or as needed.

## 2017-11-10 ENCOUNTER — Inpatient Hospital Stay (HOSPITAL_COMMUNITY)
Admission: AD | Admit: 2017-11-10 | Discharge: 2017-11-10 | Disposition: A | Discharge status: Home / Self Care | Payer: Medicaid Other | Source: Ambulatory Visit | Attending: Obstetrics & Gynecology | Admitting: Obstetrics & Gynecology

## 2017-11-10 ENCOUNTER — Encounter (HOSPITAL_COMMUNITY): Payer: Self-pay | Admitting: *Deleted

## 2017-11-10 DIAGNOSIS — Z79899 Other long term (current) drug therapy: Secondary | ICD-10-CM | POA: Insufficient documentation

## 2017-11-10 DIAGNOSIS — R1110 Vomiting, unspecified: Secondary | ICD-10-CM | POA: Insufficient documentation

## 2017-11-10 DIAGNOSIS — R109 Unspecified abdominal pain: Secondary | ICD-10-CM | POA: Diagnosis not present

## 2017-11-10 DIAGNOSIS — R110 Nausea: Secondary | ICD-10-CM | POA: Diagnosis not present

## 2017-11-10 DIAGNOSIS — K219 Gastro-esophageal reflux disease without esophagitis: Secondary | ICD-10-CM | POA: Diagnosis not present

## 2017-11-10 DIAGNOSIS — Z833 Family history of diabetes mellitus: Secondary | ICD-10-CM | POA: Diagnosis not present

## 2017-11-10 DIAGNOSIS — Z800 Family history of malignant neoplasm of digestive organs: Secondary | ICD-10-CM | POA: Insufficient documentation

## 2017-11-10 LAB — URINALYSIS, ROUTINE W REFLEX MICROSCOPIC
BACTERIA UA: NONE SEEN
BILIRUBIN URINE: NEGATIVE
Glucose, UA: NEGATIVE mg/dL
Hgb urine dipstick: NEGATIVE
Ketones, ur: NEGATIVE mg/dL
Nitrite: NEGATIVE
PH: 5 (ref 5.0–8.0)
Protein, ur: NEGATIVE mg/dL
SPECIFIC GRAVITY, URINE: 1.029 (ref 1.005–1.030)

## 2017-11-10 MED ORDER — OMEPRAZOLE 20 MG PO CPDR: 20.0000 mg | DELAYED_RELEASE_CAPSULE | Freq: Two times a day (BID) | ORAL | 1 refills | Status: DC

## 2017-11-10 MED ORDER — GI COCKTAIL ~~LOC~~
30.0000 mL | Freq: Once | ORAL | Status: AC
  Administered 2017-11-10: 30 mL via ORAL
  Filled 2017-11-10: qty 30

## 2017-11-10 NOTE — Discharge Instructions (Signed)

## 2017-11-10 NOTE — MAU Provider Note (Signed)
  History     CSN: [id number]  Arrival date and time: 11/10/17 2027   None     Chief Complaint  Patient presents with  . Emesis  . Abdominal Pain   HPI Julie [name] is a 22 y.o. 21P1001 female who presents to the MAU with complaints of epigastric pain and n/v. She was seen at an urgent care about 5 days ago for similar symptoms. She was given Prilosec and Zofran. Patient states she has been taking her medications but still having discomfort. She denies blood with emesis. Having normal bowel movements. No fevers. Recently postpartum and working. She believes work is causing her to have this pain.    Past Medical History:  Diagnosis Date  . GERD (gastroesophageal reflux disease)     Past Surgical History:  Procedure Laterality Date  . NO PAST SURGERIES      Family History  Problem Relation Age of Onset  . Diabetes Mother   . Pancreatic cancer Father   . Cancer Maternal Grandfather     Social History   Tobacco Use  . Smoking status: Never Smoker  . Smokeless tobacco: Never Used  Substance Use Topics  . Alcohol use: No  . Drug use: No    Allergies: No Known Allergies  Medications Prior to Admission  Medication Sig Dispense Refill Last Dose  . acetaminophen (TYLENOL) 500 MG tablet Take 1,000 mg by mouth every 6 (six) hours as needed for moderate pain.   10/11/2017 at Unknown time  . omeprazole (PRILOSEC) 20 MG capsule Take 1 capsule (20 mg total) by mouth daily. 21 capsule 1   . ondansetron (ZOFRAN-ODT) 8 MG disintegrating tablet Take 1 tablet (8 mg total) by mouth every 8 (eight) hours as needed for nausea. 12 tablet 0   . Prenatal Multivit-Min-Fe-FA (PRENATAL VITAMINS PO) Take by mouth.       Review of Systems  All systems reviewed and are negative for acute change except as noted in the HPI.  Physical Exam   Blood pressure 125/79, pulse 92, temperature 98.1 F (36.7 C), resp. rate 18, height 5\' 5"  (1.651 m), weight 55.3 kg (122 lb), not currently  breastfeeding.  Physical Exam  Nursing note and vitals reviewed. Constitutional: She is oriented to person, place, and time. She appears well-developed and well-nourished. No distress.  HENT:  Head: Normocephalic and atraumatic.  Mouth/Throat: Oropharynx is clear and moist.  Eyes: EOM are normal.  Neck: Normal range of motion.  Cardiovascular: Normal rate, regular rhythm and intact distal pulses.  Respiratory: Effort normal.  GI: Soft. She exhibits no mass. There is tenderness in the epigastric area. There is no guarding.  Musculoskeletal: Normal range of motion.  Neurological: She is alert and oriented to person, place, and time.  Skin: Skin is warm and dry.  Psychiatric: She has a normal mood and affect.    MAU Course  Procedures  MDM VSS No red flags or need for emergent interventions GI cocktail given for symptom relief  Assessment and Plan  Gastriits vs GERD  Discharge home in stable condition Patient encouraged to continue medications Follow-up primary care provider Handout provided  Caryl AdaJazma Derreon Consalvo, DO 11/10/2017, 9:19 PM

## 2017-11-10 NOTE — MAU Note (Signed)
Pt reports she has ben having epigastric abd pain and vomiting x1 week. Taking Prilosec daily as prescribed and took a Zofran about an hour ago with out relief.

## 2017-11-14 ENCOUNTER — Encounter: Payer: Self-pay | Admitting: Certified Nurse Midwife

## 2017-11-14 ENCOUNTER — Ambulatory Visit (INDEPENDENT_AMBULATORY_CARE_PROVIDER_SITE_OTHER): Payer: Medicaid Other | Admitting: Certified Nurse Midwife

## 2017-11-14 DIAGNOSIS — Z3042 Encounter for surveillance of injectable contraceptive: Secondary | ICD-10-CM | POA: Diagnosis not present

## 2017-11-14 DIAGNOSIS — Z3202 Encounter for pregnancy test, result negative: Secondary | ICD-10-CM | POA: Diagnosis not present

## 2017-11-14 DIAGNOSIS — Z1389 Encounter for screening for other disorder: Secondary | ICD-10-CM | POA: Diagnosis not present

## 2017-11-14 LAB — POCT URINE PREGNANCY: PREG TEST UR: NEGATIVE

## 2017-11-14 MED ORDER — MEDROXYPROGESTERONE ACETATE 150 MG/ML IM SUSP
150.0000 mg | Freq: Once | INTRAMUSCULAR | Status: AC
  Administered 2017-11-14: 150 mg via INTRAMUSCULAR

## 2017-11-14 NOTE — Progress Notes (Signed)
Subjective:     Julie [name] is a 22 y.o. female who presents for a postpartum visit. She is 4 weeks postpartum following a spontaneous vaginal delivery. I have fully reviewed the prenatal and intrapartum course. The delivery was at 38 gestational weeks. Outcome: spontaneous vaginal delivery. Anesthesia: epidural, general and IV sedation. Postpartum course has been normal. Baby's course has been good. Baby is feeding by bottle-Gerber Good Start. Bleeding no bleeding. Bowel function is normal. Bladder function is normal. Patient is sexually active for 1 week. Contraception method is Depo-Provera injections and condoms. Reports intermittent condom use. Postpartum depression screening: neg.  Tobacco, alcohol and substance abuse history reviewed.  Adult immunizations reviewed including TDAP, rubella and varicella.    Review of Systems Pertinent items are noted in HPI.   Objective:    BP 114/77   Pulse 91   Wt 123 lb 6.4 oz (56 kg)   BMI 20.53 kg/m   General:  alert, cooperative and no distress   Breasts:  inspection negative, no nipple discharge or bleeding, no masses or nodularity palpable  Lungs: clear to auscultation bilaterally  Heart:  regular rate and rhythm, S1, S2 normal, no murmur, click, rub or gallop  Abdomen: soft, non-tender; bowel sounds normal; no masses,  no organomegaly   Vulva:  not evaluated  Vagina: not evaluated  Corpus: normal  Adnexa:  no mass, fullness, tenderness  Rectal Exam: Not performed.          Assessment:     Normal 4 week postpartum exam.    Contraception management: Depo injections started   Plan:    1. Contraception: condoms and Depo-Provera injections 2. Use back up contraceptive method for 2 weeks.  3. Follow up in: 3 months for Depo-Provera injection or as needed.  Preconception counseling provided Healthy lifestyle practices reviewed

## 2017-11-14 NOTE — Progress Notes (Signed)
Here for a Post Partum Exam

## 2017-11-14 NOTE — Progress Notes (Signed)
DEPO given in left deltoid, tolerated well.    Next DEPO 4/30-5/13/2019.  Administrations This Visit    medroxyPROGESTERone (DEPO-PROVERA) injection 150 mg    Admin Date 11/14/2017 Action Given Dose 150 mg Route Intramuscular Administered By Maretta BeesMcGlashan, Jahniyah Revere J, RMA

## 2017-11-15 ENCOUNTER — Encounter: Payer: Self-pay | Admitting: Certified Nurse Midwife

## 2017-11-30 ENCOUNTER — Encounter (HOSPITAL_COMMUNITY): Payer: Self-pay | Admitting: Emergency Medicine

## 2017-11-30 ENCOUNTER — Ambulatory Visit (HOSPITAL_COMMUNITY)
Admission: EM | Admit: 2017-11-30 | Discharge: 2017-11-30 | Disposition: A | Discharge status: Other Acute Inpatient Hospital | Payer: Medicaid Other

## 2017-11-30 ENCOUNTER — Ambulatory Visit (HOSPITAL_COMMUNITY)
Admission: EM | Admit: 2017-11-30 | Discharge: 2017-11-30 | Disposition: A | Discharge status: Home / Self Care | Payer: Medicaid Other | Attending: Physician Assistant | Admitting: Physician Assistant

## 2017-11-30 DIAGNOSIS — H9201 Otalgia, right ear: Secondary | ICD-10-CM | POA: Diagnosis not present

## 2017-11-30 MED ORDER — FLUTICASONE PROPIONATE 50 MCG/ACT NA SUSP: 2.0000 | Freq: Every day | NASAL | 0 refills | Status: DC

## 2017-11-30 MED ORDER — NEOMYCIN-POLYMYXIN-HC 3.5-10000-1 OT SUSP: 4.0000 [drp] | Freq: Four times a day (QID) | OTIC | 0 refills | Status: DC

## 2017-11-30 MED ORDER — IPRATROPIUM BROMIDE 0.06 % NA SOLN: 2.0000 | Freq: Four times a day (QID) | NASAL | 0 refills | Status: DC

## 2017-11-30 MED ORDER — CETIRIZINE HCL 10 MG PO TABS: 10.0000 mg | ORAL_TABLET | Freq: Every day | ORAL | 0 refills | Status: DC

## 2017-11-30 MED ORDER — NEOMYCIN-POLYMYXIN-HC 3.5-10000-1 OT SUSP: 4.0000 [drp] | Freq: Four times a day (QID) | OTIC | 0 refills | Status: AC

## 2017-11-30 NOTE — ED Provider Notes (Signed)
[facility]    CSN: [id number] Arrival date & time: 11/30/17  [id number]     History   Chief Complaint Chief Complaint  Patient presents with  . Otalgia    HPI Julie [name] is a 22 y.o. female.   22 year old female comes in for 3-day history of right ear pain.  Has had some nasal congestion, rhinorrhea.  Denies cough, fever, chills, night sweats.  Does use cotton swabs to clean the ear.  Partner states she has also had water in the ears.  Denies recent travel.  Has not tried anything for the symptoms.      Past Medical History:  Diagnosis Date  . GERD (gastroesophageal reflux disease)     Patient Active Problem List   Diagnosis Date Noted  . 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 NO PAST SURGERIES      OB History    Gravida Para Term Preterm AB Living   1 1 1     1    SAB TAB Ectopic Multiple Live Births         0 1       Home Medications    Prior to Admission medications   Medication Sig Start Date End Date Taking? Authorizing Provider  acetaminophen (TYLENOL) 500 MG tablet Take 1,000 mg by mouth every 6 (six) hours as needed for moderate pain.    [provider]  cetirizine (ZYRTEC) 10 MG tablet Take 1 tablet (10 mg total) by mouth daily. 11/30/17   Cathie HoopsYu, Amy V, PA-C  fluticasone (FLONASE) 50 MCG/ACT nasal spray Place 2 sprays into both nostrils daily. 11/30/17   Cathie HoopsYu, Amy V, PA-C  ipratropium (ATROVENT) 0.06 % nasal spray Place 2 sprays into both nostrils 4 (four) times daily. 11/30/17   Cathie HoopsYu, Amy V, PA-C  neomycin-polymyxin-hydrocortisone (CORTISPORIN) 3.5-10000-1 OTIC suspension Place 4 drops into the right ear 4 (four) times daily for 7 days. 11/30/17 12/07/17  Belinda FisherYu, Amy V, PA-C  omeprazole (PRILOSEC) 20 MG capsule Take 1 capsule (20 mg total) by mouth 2 (two) times daily before a meal. 11/10/17   Rolm BookbinderNeill, Caroline M, CNM  ondansetron (ZOFRAN-ODT) 8 MG disintegrating tablet Take 1 tablet (8  mg total) by mouth every 8 (eight) hours as needed for nausea. 11/05/17   Elvina SidleLauenstein, Kurt, MD  Prenatal Multivit-Min-Fe-FA (PRENATAL VITAMINS PO) Take by mouth.    [provider]    Family History Family History  Problem Relation Age of Onset  . Diabetes Mother   . Pancreatic cancer Father   . Cancer Maternal Grandfather     Social History Social History   Tobacco Use  . Smoking status: Never Smoker  . Smokeless tobacco: Never Used  Substance Use Topics  . Alcohol use: No  . Drug use: No     Allergies   Patient has no known allergies.   Review of Systems Review of Systems  Reason unable to perform ROS: See HPI as above.     Physical Exam Triage Vital Signs ED Triage Vitals [11/30/17 1937]  Enc Vitals Group     BP (!) 107/55     Pulse Rate (!) 106     Resp 18     Temp 98.2 F (36.8 C)     Temp src      SpO2 100 %     Weight      Height      Head Circumference  Peak Flow      Pain Score 5     Pain Loc      Pain Edu?      Excl. in GC?    No data found.  Updated Vital Signs BP (!) 107/55   Pulse (!) 106   Temp 98.2 F (36.8 C)   Resp 18   SpO2 100%   Physical Exam  Constitutional: She is oriented to person, place, and time. She appears well-developed and well-nourished. No distress.  HENT:  Head: Normocephalic and atraumatic.  Right Ear: Tympanic membrane and external ear normal. Tympanic membrane is not erythematous and not bulging. No middle ear effusion.  Left Ear: Tympanic membrane, external ear and ear canal normal. Tympanic membrane is not erythematous and not bulging.  No middle ear effusion.  No tenderness on palpation of bilateral tragus.  Slight erythema of the right ear canal without obvious swelling.   Eyes: Conjunctivae are normal. Pupils are equal, round, and reactive to light.  Neurological: She is alert and oriented to person, place, and time.     UC Treatments / Results  Labs (all labs ordered are listed, but only  abnormal results are displayed) Labs Reviewed - No data to display  EKG  EKG Interpretation None       Radiology No results found.  Procedures Procedures (including critical care time)  Medications Ordered in UC Medications - No data to display   Initial Impression / Assessment and Plan / UC Course  I have reviewed the triage vital signs and the nursing notes.  Pertinent labs & imaging results that were available during my care of the patient were reviewed by me and considered in my medical decision making (see chart for details).    Discussed possible otitis externa versus eustachian tube dysfunction causing symptoms.  No signs of otitis media.  Will start Cortisporin for possible otitis externa.  Flonase for possible eustachian tube dysfunction.  Other symptomatic treatment discussed.  Return precautions given.  Patient expresses understanding and agrees to plan.  Final Clinical Impressions(s) / UC Diagnoses   Final diagnoses:  Right ear pain    ED Discharge Orders        Ordered    fluticasone (FLONASE) 50 MCG/ACT nasal spray  Daily,   Status:  Discontinued     11/30/17 2000    ipratropium (ATROVENT) 0.06 % nasal spray  4 times daily,   Status:  Discontinued     11/30/17 2000    cetirizine (ZYRTEC) 10 MG tablet  Daily,   Status:  Discontinued     11/30/17 2000    neomycin-polymyxin-hydrocortisone (CORTISPORIN) 3.5-10000-1 OTIC suspension  4 times daily,   Status:  Discontinued     11/30/17 2000    cetirizine (ZYRTEC) 10 MG tablet  Daily     11/30/17 2001    fluticasone (FLONASE) 50 MCG/ACT nasal spray  Daily     11/30/17 2001    ipratropium (ATROVENT) 0.06 % nasal spray  4 times daily     11/30/17 2001    neomycin-polymyxin-hydrocortisone (CORTISPORIN) 3.5-10000-1 OTIC suspension  4 times daily     11/30/17 2001        Belinda Fisher, Cordelia Poche 11/30/17 2105

## 2017-11-30 NOTE — ED Triage Notes (Signed)
Pt c/o R ear pain x3 days.  

## 2017-11-30 NOTE — Discharge Instructions (Signed)
Start cortisporin for possible outer ear infection. Symptoms could also be due to nasal congestion. Start flonase, atrovent nasal spray, zyrtec for nasal congestion/drainage. You can use over the counter nasal saline rinse such as neti pot for nasal congestion. Tylenol/motrin for fever and pain.

## 2017-12-25 ENCOUNTER — Ambulatory Visit (HOSPITAL_COMMUNITY)
Admission: EM | Admit: 2017-12-25 | Discharge: 2017-12-25 | Disposition: A | Discharge status: Home / Self Care | Payer: Medicaid Other | Attending: Family Medicine | Admitting: Family Medicine

## 2017-12-25 ENCOUNTER — Encounter (HOSPITAL_COMMUNITY): Payer: Self-pay | Admitting: Emergency Medicine

## 2017-12-25 ENCOUNTER — Other Ambulatory Visit: Payer: Self-pay

## 2017-12-25 DIAGNOSIS — R109 Unspecified abdominal pain: Secondary | ICD-10-CM | POA: Diagnosis present

## 2017-12-25 DIAGNOSIS — K219 Gastro-esophageal reflux disease without esophagitis: Secondary | ICD-10-CM | POA: Diagnosis not present

## 2017-12-25 DIAGNOSIS — M549 Dorsalgia, unspecified: Secondary | ICD-10-CM | POA: Insufficient documentation

## 2017-12-25 LAB — POCT URINALYSIS DIP (DEVICE)
BILIRUBIN URINE: NEGATIVE
GLUCOSE, UA: NEGATIVE mg/dL
Hgb urine dipstick: NEGATIVE
KETONES UR: NEGATIVE mg/dL
Nitrite: NEGATIVE
PH: 7 (ref 5.0–8.0)
PROTEIN: NEGATIVE mg/dL
SPECIFIC GRAVITY, URINE: 1.025 (ref 1.005–1.030)
Urobilinogen, UA: 0.2 mg/dL (ref 0.0–1.0)

## 2017-12-25 LAB — POCT PREGNANCY, URINE: Preg Test, Ur: NEGATIVE

## 2017-12-25 MED ORDER — OMEPRAZOLE 20 MG PO CPDR: 20.0000 mg | DELAYED_RELEASE_CAPSULE | Freq: Every day | ORAL | 0 refills | Status: DC

## 2017-12-25 MED ORDER — ONDANSETRON HCL 4 MG PO TABS: 4.0000 mg | ORAL_TABLET | Freq: Three times a day (TID) | ORAL | 0 refills | Status: DC | PRN

## 2017-12-25 MED ORDER — GI COCKTAIL ~~LOC~~: 30.0000 mL | Freq: Once | ORAL | Status: DC

## 2017-12-25 MED ORDER — GI COCKTAIL ~~LOC~~
ORAL | Status: AC
  Filled 2017-12-25: qty 30

## 2017-12-25 NOTE — ED Provider Notes (Signed)
[facility]    CSN: [id number] Arrival date & time: 12/25/17  1958     History   Chief Complaint Chief Complaint  Patient presents with  . Abdominal Pain    HPI Julie [name] is a 22 y.o. female.   Julie [name] presents with complaints of abdominal pain which started 2 hours ago, radiates to her back. States yesterday had episode of vomiting and diarrhea. Has not had nausea, vomiting or diarrhea today. It is an achy pain. No fevers. States similar to reflux she has had in the past. Lost her omeprazole which had been helping. Denies urinary symptoms. Denies vaginal bleeding, discharge or pain. Has not had a period since delivery of baby 10/12/2017. She is bottle feeding. Without other contributing medical history.    ROS per HPI.      Past Medical History:  Diagnosis Date  . GERD (gastroesophageal reflux disease)     Patient Active Problem List   Diagnosis Date Noted  . 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 NO PAST SURGERIES      OB History    Gravida  1   Para  1   Term  1   Preterm      AB      Living  1     SAB      TAB      Ectopic      Multiple  0   Live Births  1            Home Medications    Prior to Admission medications   Medication Sig Start Date End Date Taking? Authorizing Provider  acetaminophen (TYLENOL) 500 MG tablet Take 1,000 mg by mouth every 6 (six) hours as needed for moderate pain.    [provider]  cetirizine (ZYRTEC) 10 MG tablet Take 1 tablet (10 mg total) by mouth daily. 11/30/17   Cathie Hoops, Amy V, PA-C  fluticasone (FLONASE) 50 MCG/ACT nasal spray Place 2 sprays into both nostrils daily. 11/30/17   Cathie Hoops, Amy V, PA-C  ipratropium (ATROVENT) 0.06 % nasal spray Place 2 sprays into both nostrils 4 (four) times daily. 11/30/17   Cathie Hoops, Amy V, PA-C  omeprazole (PRILOSEC) 20 MG capsule Take 1 capsule (20 mg total) by mouth daily. 12/25/17   Georgetta Haber, NP  ondansetron (ZOFRAN) 4 MG tablet Take 1 tablet (4 mg total) by mouth every 8 (eight) hours as needed for nausea or vomiting. 12/25/17   Georgetta Haber, NP    Family History Family History  Problem Relation Age of Onset  . Diabetes Mother   . Pancreatic cancer Father   . Cancer Maternal Grandfather     Social History Social History   Tobacco Use  . Smoking status: Never Smoker  . Smokeless tobacco: Never Used  Substance Use Topics  . Alcohol use: No  . Drug use: No     Allergies   Patient has no known allergies.   Review of Systems Review of Systems   Physical Exam Triage Vital Signs ED Triage Vitals  Enc Vitals Group     BP 12/25/17 2057 120/80     Pulse Rate 12/25/17 2057 93     Resp --      Temp 12/25/17 2057 98.8 F (37.1 C)     Temp Source 12/25/17 2057 Oral     SpO2 12/25/17 2057 100 %     Weight --  Height --      Head Circumference --      Peak Flow --      Pain Score 12/25/17 2055 8     Pain Loc --      Pain Edu? --      Excl. in GC? --    No data found.  Updated Vital Signs BP 120/80 (BP Location: Left Arm)   Pulse 93   Temp 98.8 F (37.1 C) (Oral)   SpO2 100%   Breastfeeding? No   Visual Acuity Right Eye Distance:   Left Eye Distance:   Bilateral Distance:    Right Eye Near:   Left Eye Near:    Bilateral Near:     Physical Exam  Constitutional: She is oriented to person, place, and time. She appears well-developed and well-nourished. No distress.  Cardiovascular: Normal rate, regular rhythm and normal heart sounds.  Pulmonary/Chest: Effort normal and breath sounds normal.  Abdominal: Soft. Bowel sounds are increased. There is tenderness in the right upper quadrant, epigastric area and left upper quadrant. There is no rigidity, no rebound, no guarding, no CVA tenderness, no tenderness at McBurney's point and negative Murphy's sign.  Neurological: She is alert and oriented to person, place, and time.  Skin: Skin  is warm and dry.     UC Treatments / Results  Labs (all labs ordered are listed, but only abnormal results are displayed) Labs Reviewed  POCT URINALYSIS DIP (DEVICE) - Abnormal; Notable for the following components:      Result Value   Leukocytes, UA TRACE (*)    All other components within normal limits  URINE CULTURE  POCT PREGNANCY, URINE    EKG None Radiology No results found.  Procedures Procedures (including critical care time)  Medications Ordered in UC Medications  gi cocktail (Maalox,Lidocaine,Donnatal) (has no administration in time range)     Initial Impression / Assessment and Plan / UC Course  I have reviewed the triage vital signs and the nursing notes.  Pertinent labs & imaging results that were available during my care of the patient were reviewed by me and considered in my medical decision making (see chart for details).     Upper abdominal pain, primarily epigastric. Similar to reflux she has had in the past. Non toxic in appearance. Afebrile. Without vomiting or diarrhea. Gi cocktail provided. zofran as needed. Refilled omeprazole daily. Return precautions provided. Patient verbalized understanding and agreeable to plan.    Final Clinical Impressions(s) / UC Diagnoses   Final diagnoses:  Gastroesophageal reflux disease without esophagitis    ED Discharge Orders        Ordered    omeprazole (PRILOSEC) 20 MG capsule  Daily     12/25/17 2122    ondansetron (ZOFRAN) 4 MG tablet  Every 8 hours PRN     12/25/17 2122       Controlled Substance Prescriptions [place] Controlled Substance Registry consulted? Not Applicable   Georgetta HaberBurky, Dayja Loveridge B, NP 12/25/17 2128

## 2017-12-25 NOTE — Discharge Instructions (Addendum)
Omeprazole daily. Zofran as needed for nausea. Please see provided information about Genella RifeGerd. If develop worsening of abdominal pain, nausea, vomiting, fevers, vaginal bleeding, urinary symptoms please return or go to Er.

## 2017-12-25 NOTE — ED Triage Notes (Signed)
C/o lower abdominal pain onset today, denies dysuria

## 2017-12-26 ENCOUNTER — Emergency Department (HOSPITAL_COMMUNITY): Payer: Medicaid Other

## 2017-12-26 ENCOUNTER — Emergency Department (HOSPITAL_COMMUNITY)
Admission: EM | Admit: 2017-12-26 | Discharge: 2017-12-27 | DRG: 439 | Disposition: A | Discharge status: Home / Self Care | Payer: Medicaid Other | Attending: Family Medicine | Admitting: Family Medicine

## 2017-12-26 ENCOUNTER — Encounter (HOSPITAL_COMMUNITY): Payer: Self-pay | Admitting: Internal Medicine

## 2017-12-26 DIAGNOSIS — K8020 Calculus of gallbladder without cholecystitis without obstruction: Secondary | ICD-10-CM | POA: Diagnosis not present

## 2017-12-26 DIAGNOSIS — N390 Urinary tract infection, site not specified: Secondary | ICD-10-CM | POA: Diagnosis not present

## 2017-12-26 DIAGNOSIS — Z833 Family history of diabetes mellitus: Secondary | ICD-10-CM | POA: Diagnosis not present

## 2017-12-26 DIAGNOSIS — Z800 Family history of malignant neoplasm of digestive organs: Secondary | ICD-10-CM

## 2017-12-26 DIAGNOSIS — K851 Biliary acute pancreatitis without necrosis or infection: Secondary | ICD-10-CM | POA: Diagnosis not present

## 2017-12-26 DIAGNOSIS — K219 Gastro-esophageal reflux disease without esophagitis: Secondary | ICD-10-CM | POA: Diagnosis present

## 2017-12-26 DIAGNOSIS — R1011 Right upper quadrant pain: Secondary | ICD-10-CM | POA: Diagnosis present

## 2017-12-26 DIAGNOSIS — K8050 Calculus of bile duct without cholangitis or cholecystitis without obstruction: Secondary | ICD-10-CM

## 2017-12-26 DIAGNOSIS — K859 Acute pancreatitis without necrosis or infection, unspecified: Secondary | ICD-10-CM

## 2017-12-26 LAB — COMPREHENSIVE METABOLIC PANEL
ALBUMIN: 4.5 g/dL (ref 3.5–5.0)
ALT: 121 U/L — ABNORMAL HIGH (ref 14–54)
AST: 134 U/L — AB (ref 15–41)
Alkaline Phosphatase: 110 U/L (ref 38–126)
Anion gap: 8 (ref 5–15)
BUN: 7 mg/dL (ref 6–20)
CHLORIDE: 107 mmol/L (ref 101–111)
CO2: 23 mmol/L (ref 22–32)
Calcium: 9.2 mg/dL (ref 8.9–10.3)
Creatinine, Ser: 0.68 mg/dL (ref 0.44–1.00)
GFR calc Af Amer: >60 mL/min (ref >60)
Glucose, Bld: 95 mg/dL (ref 65–99)
POTASSIUM: 3.6 mmol/L (ref 3.5–5.1)
SODIUM: 138 mmol/L (ref 135–145)
Total Bilirubin: 0.8 mg/dL (ref 0.3–1.2)
Total Protein: 7.9 g/dL (ref 6.5–8.1)

## 2017-12-26 LAB — URINALYSIS, ROUTINE W REFLEX MICROSCOPIC
BILIRUBIN URINE: NEGATIVE
Glucose, UA: NEGATIVE mg/dL
HGB URINE DIPSTICK: NEGATIVE
Ketones, ur: NEGATIVE mg/dL
Nitrite: POSITIVE — AB
PROTEIN: 30 mg/dL — AB
Specific Gravity, Urine: 1.026 (ref 1.005–1.030)
pH: 6 (ref 5.0–8.0)

## 2017-12-26 LAB — CBC
HEMATOCRIT: 38.7 % (ref 36.0–46.0)
Hemoglobin: 12.9 g/dL (ref 12.0–15.0)
MCH: 29.9 pg (ref 26.0–34.0)
MCHC: 33.3 g/dL (ref 30.0–36.0)
MCV: 89.8 fL (ref 78.0–100.0)
Platelets: 311 10*3/uL (ref 150–400)
RBC: 4.31 MIL/uL (ref 3.87–5.11)
RDW: 12.6 % (ref 11.5–15.5)
WBC: 7.5 10*3/uL (ref 4.0–10.5)

## 2017-12-26 LAB — I-STAT BETA HCG BLOOD, ED (MC, WL, AP ONLY): I-stat hCG, quantitative: <5 m[IU]/mL (ref <5)

## 2017-12-26 LAB — LIPASE, BLOOD

## 2017-12-26 MED ORDER — HYDROMORPHONE HCL 1 MG/ML IJ SOLN: 0.5000 mg | INTRAMUSCULAR | Status: DC | PRN

## 2017-12-26 MED ORDER — CIPROFLOXACIN IN D5W 400 MG/200ML IV SOLN
400.0000 mg | Freq: Two times a day (BID) | INTRAVENOUS | Status: DC
  Administered 2017-12-27 (×2): 400 mg via INTRAVENOUS
  Filled 2017-12-26 (×2): qty 200

## 2017-12-26 MED ORDER — SODIUM CHLORIDE 0.9 % IV BOLUS
1000.0000 mL | Freq: Once | INTRAVENOUS | Status: AC
  Administered 2017-12-26: 1000 mL via INTRAVENOUS

## 2017-12-26 MED ORDER — IOPAMIDOL (ISOVUE-300) INJECTION 61%
INTRAVENOUS | Status: AC
  Administered 2017-12-26: 100 mL
  Filled 2017-12-26: qty 100

## 2017-12-26 MED ORDER — SODIUM CHLORIDE 0.9 % IV SOLN
INTRAVENOUS | Status: DC
  Administered 2017-12-26: 23:00:00 via INTRAVENOUS

## 2017-12-26 NOTE — ED Notes (Signed)
Patient transported to CT 

## 2017-12-26 NOTE — ED Provider Notes (Signed)
[facility] EMERGENCY DEPARTMENT Provider Note   CSN: [id number] Arrival date & time: 12/26/17  1304     History   Chief Complaint Chief Complaint  Patient presents with  . Abdominal Pain    HPI Julie [name] is a 22 y.o. female.  HPI  22 year old female who is 2 months postpartum presents with acute abdominal pain.  She has been having this pain on and off for about 2 months, starting 1 week after delivery.  It feels achy and occasionally radiates to her back.  It comes and goes.  Currently while on talking to her there is no pain.  She is tried Tylenol without relief.  She has been also on omeprazole given this feels somewhat similar to prior GERD.  She had one episode of vomiting and one episode of diarrhea couple days ago but none since.  No fevers.  No urinary symptoms. Denies ETOH use. Some marijuana use but no other drug use. No OTC or herbal medicines.  Past Medical History:  Diagnosis Date  . GERD (gastroesophageal reflux disease)     Patient Active Problem List   Diagnosis Date Noted  . Pancreatitis 12/26/2017  . 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 NO PAST SURGERIES       OB History    Gravida  1   Para  1   Term  1   Preterm      AB      Living  1     SAB      TAB      Ectopic      Multiple  0   Live Births  1            Home Medications    Prior to Admission medications   Medication Sig Start Date End Date Taking? Authorizing Provider  acetaminophen (TYLENOL) 500 MG tablet Take 1,000 mg by mouth every 6 (six) hours as needed for moderate pain.   Yes [provider]  omeprazole (PRILOSEC) 20 MG capsule Take 1 capsule (20 mg total) by mouth daily. 12/25/17  Yes Linus Mako B, NP  cetirizine (ZYRTEC) 10 MG tablet Take 1 tablet (10 mg total) by mouth daily. Patient not taking: Reported on 12/26/2017 11/30/17   Belinda Fisher, PA-C  fluticasone  [facility]) 50 MCG/ACT nasal spray Place 2 sprays into both nostrils daily. Patient not taking: Reported on 12/26/2017 11/30/17   Belinda Fisher, PA-C  ipratropium (ATROVENT) 0.06 % nasal spray Place 2 sprays into both nostrils 4 (four) times daily. Patient not taking: Reported on 12/26/2017 11/30/17   Belinda Fisher, PA-C  ondansetron (ZOFRAN) 4 MG tablet Take 1 tablet (4 mg total) by mouth every 8 (eight) hours as needed for nausea or vomiting. 12/25/17   Georgetta Haber, NP    Family History Family History  Problem Relation Age of Onset  . Diabetes Mother   . Pancreatic cancer Father   . Cancer Maternal Grandfather     Social History Social History   Tobacco Use  . Smoking status: Never Smoker  . Smokeless tobacco: Never Used  Substance Use Topics  . Alcohol use: No  . Drug use: No     Allergies   Patient has no known allergies.   Review of Systems Review of Systems  Constitutional: Negative for fever.  Gastrointestinal: Positive for abdominal pain, diarrhea (2 days ago x 1) and vomiting (2 days  ago x 1). Negative for nausea.  Genitourinary: Negative for dysuria, frequency and hematuria.  Musculoskeletal: Positive for back pain.  All other systems reviewed and are negative.    Physical Exam Updated Vital Signs BP (!) 138/98   Pulse 85   Temp 98 F (36.7 C) (Oral)   Resp 18   SpO2 99%   Physical Exam  Constitutional: She is oriented to person, place, and time. She appears well-developed and well-nourished.  Non-toxic appearance. She does not appear ill. No distress.  HENT:  Head: Normocephalic and atraumatic.  Right Ear: External ear normal.  Left Ear: External ear normal.  Nose: Nose normal.  Eyes: Right eye exhibits no discharge. Left eye exhibits no discharge.  Cardiovascular: Normal rate, regular rhythm and normal heart sounds.  Pulmonary/Chest: Effort normal and breath sounds normal.  Abdominal: Soft. She exhibits no distension. There is no tenderness.    Neurological: She is alert and oriented to person, place, and time.  Skin: Skin is warm and dry.  Nursing note and vitals reviewed.    ED Treatments / Results  Labs (all labs ordered are listed, but only abnormal results are displayed) Labs Reviewed  LIPASE, BLOOD - Abnormal; Notable for the following components:      Result Value   Lipase >10,000 (*)    All other components within normal limits  COMPREHENSIVE METABOLIC PANEL - Abnormal; Notable for the following components:   AST 134 (*)    ALT 121 (*)    All other components within normal limits  URINALYSIS, ROUTINE W REFLEX MICROSCOPIC - Abnormal; Notable for the following components:   APPearance HAZY (*)    Protein, ur 30 (*)    Nitrite POSITIVE (*)    Leukocytes, UA MODERATE (*)    Bacteria, UA RARE (*)    Squamous Epithelial / LPF 0-5 (*)    All other components within normal limits  CBC  HEPATITIS PANEL, ACUTE  I-STAT BETA HCG BLOOD, ED ([facility], [facility], [facility] ONLY)    EKG None  Radiology Ct Abdomen Pelvis W Contrast  Result Date: 12/26/2017 CLINICAL DATA:  Centralized abdominal pain with nausea EXAM: CT ABDOMEN AND PELVIS WITH CONTRAST TECHNIQUE: Multidetector CT imaging of the abdomen and pelvis was performed using the standard protocol following bolus administration of intravenous contrast. CONTRAST:  ISOVUE-300 IOPAMIDOL (ISOVUE-300) INJECTION 61% COMPARISON:  None. FINDINGS: Lower chest: No acute abnormality. Hepatobiliary: No focal hepatic abnormality. Wall thickening or mild fluid around the gallbladder. No calcified stones. Mild intra and extrahepatic biliary enlargement, extrahepatic bile duct measures up to 9 mm on coronal views. No definitive calcified stones along the course of the duct. Pancreas: No inflammation.  No ductal dilatation Spleen: Normal in size without focal abnormality. Adrenals/Urinary Tract: Adrenal glands are unremarkable. Kidneys are normal, without renal calculi, focal lesion, or  hydronephrosis. Bladder is unremarkable. Stomach/Bowel: Stomach is within normal limits. Appendix appears normal. No evidence of bowel wall thickening, distention, or inflammatory changes. Vascular/Lymphatic: No significant vascular findings are present. No enlarged abdominal or pelvic lymph nodes. Reproductive: Uterus and bilateral adnexa are unremarkable. Other: Negative for free air or free fluid.  Fat in the umbilicus. Musculoskeletal: No acute or significant osseous findings. IMPRESSION: 1. Gallbladder wall thickening versus small amount of pericholecystic fluid. Suggest correlation with ultrasound. 2. Mild intra and extrahepatic biliary enlargement. Recommend correlation with LFTs with follow-up MRCP as indicated. Electronically Signed   By: Jasmine Pang M.D.   On: 12/26/2017 20:35   US Abdomen Limited Ruq  Result Date: 12/26/2017 CLINICAL DATA:  Right upper quadrant pain abnormal CT EXAM: ULTRASOUND ABDOMEN LIMITED RIGHT UPPER QUADRANT COMPARISON:  CT 12/26/2017 FINDINGS: Gallbladder: Multiple small stones in the gallbladder measuring up to 9 mm. Wall thickening up to 5 mm. Negative sonographic Murphy. Common bile duct: Diameter: Within normal limits at 3.3 mm Liver: No focal lesion identified. Within normal limits in parenchymal echogenicity. Portal vein is patent on color Doppler imaging with normal direction of blood flow towards the liver. IMPRESSION: 1. Gallstones with wall thickening but negative sonographic Murphy's. Findings are nonspecific and could be seen with acute or chronic cholecystitis, liver disease, and hypoproteinemia/edema forming states. Hepatic biliary nuclear medicine imaging could be obtained if there is clinical suspicion for cholecystitis. Electronically Signed   By: Jasmine PangKim  Fujinaga M.D.   On: 12/26/2017 21:48    Procedures Procedures (including critical care time)  Medications Ordered in ED Medications  0.9 %  sodium chloride infusion ( Intravenous New Bag/Given 12/26/17  2237)  HYDROmorphone (DILAUDID) injection 0.5 mg (has no administration in time range)  sodium chloride 0.9 % bolus 1,000 mL (0 mLs Intravenous Stopped 12/26/17 2206)  iopamidol (ISOVUE-300) 61 % injection (100 mLs  Contrast Given 12/26/17 1953)     Initial Impression / Assessment and Plan / ED Course  I have reviewed the triage vital signs and the nursing notes.  Pertinent labs & imaging results that were available during my care of the patient were reviewed by me and considered in my medical decision making (see chart for details).     Patient has no urinary symptoms and thus I do not think this UA represents a UTI.  Her abdominal pain is explained by pancreatitis with a lipase greater than 10,000.  Oddly, she appears quite well and her abdominal exam is benign with no current abdominal pain.  Workup from a CT standpoint shows necrosis or abscess/pseudocyst but does show concerning gallbladder pathology.  Ultrasound confirms gallstones.  This is likely gallstone pancreatitis.  I discussed with Dr. Doylene Canardonner who recommends hospitalist admit and she will come evaluate.  Dr. Selena BattenKim to admit.  Final Clinical Impressions(s) / ED Diagnoses   Final diagnoses:  Acute pancreatitis  Gallstone pancreatitis    ED Discharge Orders    None       Pricilla LovelessGoldston, Marcus Schwandt, MD 12/26/17 2303

## 2017-12-26 NOTE — Consult Note (Signed)
Surgical Consultation Requesting provider: Dr. Selena Batten  CC: abdominal pain  HPI: Otherwise healthy 22 year old Julie [name] 2 months postpartum who presents with history of vague epigastric abdominal pain. This has been on and off since about 1 week after delivery. It is achy in quality and radiates to her back. Not associated with oral intake or specific activity. No relief from tylenol. One episode of vomiting  A few days ago. Constipated yesterday. Imaging shows cholelithiasis and gb wall thickening; labs notable for lipase > 10,000.  No Known Allergies  Past Medical History:  Diagnosis Date  . GERD (gastroesophageal reflux disease)     Past Surgical History:  Procedure Laterality Date  . NO PAST SURGERIES      Family History  Problem Relation Age of Onset  . Diabetes Mother   . Pancreatic cancer Father   . Cancer Maternal Grandfather     Social History   Socioeconomic History  . Marital status: Single    Spouse name: Not on file  . Number of children: Not on file  . Years of education: Not on file  . Highest education level: Not on file  Occupational History  . Not on file  Social Needs  . Financial resource strain: Not on file  . Food insecurity:    Worry: Not on file    Inability: Not on file  . Transportation needs:    Medical: Not on file    Non-medical: Not on file  Tobacco Use  . Smoking status: Never Smoker  . Smokeless tobacco: Never Used  Substance and Sexual Activity  . Alcohol use: No  . Drug use: No  . Sexual activity: Yes    Comment: pt is 3 wks postpartum  Lifestyle  . Physical activity:    Days per week: Not on file    Minutes per session: Not on file  . Stress: Not on file  Relationships  . Social connections:    Talks on phone: Not on file    Gets together: Not on file    Attends religious service: Not on file    Active member of club or organization: Not on file    Attends meetings of clubs or organizations: Not on file    Relationship  status: Not on file  Other Topics Concern  . Not on file  Social History Narrative  . Not on file    No current facility-administered medications on file prior to encounter.    Current Outpatient Medications on File Prior to Encounter  Medication Sig Dispense Refill  . acetaminophen (TYLENOL) 500 MG tablet Take 1,000 mg by mouth every 6 (six) hours as needed for moderate pain.    Marland Kitchen omeprazole (PRILOSEC) 20 MG capsule Take 1 capsule (20 mg total) by mouth daily. 30 capsule 0  . cetirizine (ZYRTEC) 10 MG tablet Take 1 tablet (10 mg total) by mouth daily. (Patient not taking: Reported on 12/26/2017) 15 tablet 0  . fluticasone (FLONASE) 50 MCG/ACT nasal spray Place 2 sprays into both nostrils daily. (Patient not taking: Reported on 12/26/2017) 1 g 0  . ipratropium (ATROVENT) 0.06 % nasal spray Place 2 sprays into both nostrils 4 (four) times daily. (Patient not taking: Reported on 12/26/2017) 15 mL 0  . ondansetron (ZOFRAN) 4 MG tablet Take 1 tablet (4 mg total) by mouth every 8 (eight) hours as needed for nausea or vomiting. 10 tablet 0    Review of Systems: a complete, 10pt review of systems was completed with pertinent positives and negatives  as documented in the HPI  Physical Exam: Vitals:   12/26/17 2115 12/26/17 2215  BP: (!) 112/94 (!) 138/98  Pulse: 85   Resp:    Temp:    SpO2: 99%    Gen: A&Ox3, no distress  Head: normocephalic, atraumatic Eyes: extraocular motions intact, anicteric.  Neck: supple without mass or thyromegaly Chest: unlabored respirations, symmetrical air entry, clear bilaterally   Cardiovascular: RRR with palpable distal pulses, no pedal edema Abdomen: soft, nondistended, not really tender presently. No mass or organomegaly.  Extremities: warm, without edema, no deformities  Neuro: grossly intact Psych: appropriate mood and affect, normal insight  Skin: warm and dry   CBC Latest Ref Rng & Units 12/26/2017 10/12/2017 07/17/2017  WBC 4.0 - 10.5 K/uL 7.5 8.6  5.7  Hemoglobin 12.0 - 15.0 g/dL 16.1 09.6 04.5  Hematocrit 36.0 - 46.0 % 38.7 34.6(L) 34.6  Platelets 150 - 400 K/uL 311 258 263    CMP Latest Ref Rng & Units 12/26/2017 10/12/2017  Glucose 65 - 99 mg/dL 95 88  BUN 6 - 20 mg/dL 7 5(L)  Creatinine 4.09 - 1.00 mg/dL 8.11 9.14(N)  Sodium 829 - 145 mmol/L 138 136  Potassium 3.5 - 5.1 mmol/L 3.6 3.9  Chloride 101 - 111 mmol/L 107 104  CO2 22 - 32 mmol/L 23 22  Calcium 8.9 - 10.3 mg/dL 9.2 56.2  Total Protein 6.5 - 8.1 g/dL 7.9 7.9  Total Bilirubin 0.3 - 1.2 mg/dL 0.8 0.7  Alkaline Phos 38 - 126 U/L 110 205(H)  AST 15 - 41 U/L 134(H) 21  ALT 14 - 54 U/L 121(H) 13(L)    No results found for: INR, PROTIME  Imaging: Ct Abdomen Pelvis W Contrast  Result Date: 12/26/2017 CLINICAL DATA:  Centralized abdominal pain with nausea EXAM: CT ABDOMEN AND PELVIS WITH CONTRAST TECHNIQUE: Multidetector CT imaging of the abdomen and pelvis was performed using the standard protocol following bolus administration of intravenous contrast. CONTRAST:  ISOVUE-300 IOPAMIDOL (ISOVUE-300) INJECTION Julie% COMPARISON:  None. FINDINGS: Lower chest: No acute abnormality. Hepatobiliary: No focal hepatic abnormality. Wall thickening or mild fluid around the gallbladder. No calcified stones. Mild intra and extrahepatic biliary enlargement, extrahepatic bile duct measures up to 9 mm on coronal views. No definitive calcified stones along the course of the duct. Pancreas: No inflammation.  No ductal dilatation Spleen: Normal in size without focal abnormality. Adrenals/Urinary Tract: Adrenal glands are unremarkable. Kidneys are normal, without renal calculi, focal lesion, or hydronephrosis. Bladder is unremarkable. Stomach/Bowel: Stomach is within normal limits. Appendix appears normal. No evidence of bowel wall thickening, distention, or inflammatory changes. Vascular/Lymphatic: No significant vascular findings are present. No enlarged abdominal or pelvic lymph nodes.  Reproductive: Uterus and bilateral adnexa are unremarkable. Other: Negative for free air or free fluid.  Fat in the umbilicus. Musculoskeletal: No acute or significant osseous findings. IMPRESSION: 1. Gallbladder wall thickening versus small amount of pericholecystic fluid. Suggest correlation with ultrasound. 2. Mild intra and extrahepatic biliary enlargement. Recommend correlation with LFTs with follow-up MRCP as indicated. Electronically Signed   By: Jasmine Pang M.D.   On: 12/26/2017 20:35   US Abdomen Limited Ruq  Result Date: 12/26/2017 CLINICAL DATA:  Right upper quadrant pain abnormal CT EXAM: ULTRASOUND ABDOMEN LIMITED RIGHT UPPER QUADRANT COMPARISON:  CT 12/26/2017 FINDINGS: Gallbladder: Multiple small stones in the gallbladder measuring up to 9 mm. Wall thickening up to 5 mm. Negative sonographic Murphy. Common bile duct: Diameter: Within normal limits at 3.3 mm Liver: No focal lesion identified.  Within normal limits in parenchymal echogenicity. Portal vein is patent on color Doppler imaging with normal direction of blood flow towards the liver. IMPRESSION: 1. Gallstones with wall thickening but negative sonographic Murphy's. Findings are nonspecific and could be seen with acute or chronic cholecystitis, liver disease, and hypoproteinemia/edema forming states. Hepatic biliary nuclear medicine imaging could be obtained if there is clinical suspicion for cholecystitis. Electronically Signed   By: Jasmine PangKim  Fujinaga M.D.   On: 12/26/2017 21:48    A/P: 21yo Julie [name] with likely gallstone pancreatitis. Recommend admission to trend lipase, agree with empiric abx, fluid resuscitation, keep NPO for now. If downtrending will plan laparoscopic cholecystectomy this admission. If persistent elevation will need MRCP. Surgery will continue to follow.    Phylliss Blakeshelsea Kawana Hegel, MD [facility] [place] Pager [phone]

## 2017-12-26 NOTE — ED Triage Notes (Signed)
Pt c/o of mid abd pain and nausea for 2 months. Pt states this has been going on since given giving birth to her daughter. Pt was seen at Urgent care yesterday for same. Hx of GERD.

## 2017-12-26 NOTE — ED Notes (Signed)
Patient transported to US 

## 2017-12-26 NOTE — H&P (Addendum)
TRH H&P   Patient Demographics:    Julie [name], is a 22 y.o. female  MRN: [id number]   DOB - May 27, 1996  Admit Date - 12/26/2017  Outpatient Primary MD for the patient is Patient, No Pcp Per  Referring MD/NP/PA:   Sherwood Gambler  Outpatient Specialists:     Patient coming from:  home  Chief Complaint  Patient presents with  . Abdominal Pain      HPI:    Julie [name]  is a 22 y.o. female, w Julie [name] apparently c/o mid abdominal pain intermittently for the past 2 weeks.  Worse today and some n/v x1 , 2 days ago.  Denies fever, chills, diarrhea, brbpr, black stool.  Pt states presented to ED due to worsening pain.    In ED,  Ultrasound=> IMPRESSION: 1. Gallstones with wall thickening but negative sonographic Murphy's. Findings are nonspecific and could be seen with acute or chronic cholecystitis, liver disease, and hypoproteinemia/edema forming states. Hepatic biliary nuclear medicine imaging could be obtained if there is clinical suspicion for cholecystitis.  CT abd pelvis IMPRESSION: 1. Gallbladder wall thickening versus small amount of pericholecystic fluid. Suggest correlation with ultrasound. 2. Mild intra and extrahepatic biliary enlargement. Recommend correlation with LFTs with follow-up MRCP as indicated.  Lipase >10,000  Na 138, K 3.6, Ast 134, Alt 121 Alk phos 110, T bili 0.8 Alb 4.5 Bun 7, Creatinine 0.68 Wbc 7.5, Hgb 12.9, Plt 311  Urinalysis wbc 6-30, rbc 0-5  Pt will be admitted for UTI, and gallstone pancreatitis    Review of systems:    In addition to the HPI above,  No Fever-chills, No Headache, No changes with Vision or hearing, No problems swallowing food or Liquids, No Chest pain, Cough or Shortness of Breath, No Blood in stool or Urine, No dysuria, No new skin rashes or bruises, No new joints pains-aches,  No new weakness,  tingling, numbness in any extremity, No recent weight gain or loss, No polyuria, polydypsia or polyphagia, No significant Mental Stressors.  A full 10 point Review of Systems was done, except as stated above, all other Review of Systems were negative.   With Past History of the following :    Past Medical History:  Diagnosis Date  . GERD (gastroesophageal reflux disease)       Past Surgical History:  Procedure Laterality Date  . NO PAST SURGERIES        Social History:     Social History   Tobacco Use  . Smoking status: Never Smoker  . Smokeless tobacco: Never Used  Substance Use Topics  . Alcohol use: No     Lives - at home  Mobility - walks by self   Family History :     Family History  Problem Relation Age of Onset  . Diabetes Mother   . Pancreatic cancer Father   . Cancer Maternal Grandfather  Home Medications:   Prior to Admission medications   Medication Sig Start Date End Date Taking? Authorizing Provider  acetaminophen (TYLENOL) 500 MG tablet Take 1,000 mg by mouth every 6 (six) hours as needed for moderate pain.   Yes [provider]  omeprazole (PRILOSEC) 20 MG capsule Take 1 capsule (20 mg total) by mouth daily. 12/25/17  Yes Augusto Gamble B, NP  cetirizine (ZYRTEC) 10 MG tablet Take 1 tablet (10 mg total) by mouth daily. Patient not taking: Reported on 12/26/2017 11/30/17   Ok Edwards, PA-C  fluticasone [facility]) 50 MCG/ACT nasal spray Place 2 sprays into both nostrils daily. Patient not taking: Reported on 12/26/2017 11/30/17   Ok Edwards, PA-C  ipratropium (ATROVENT) 0.06 % nasal spray Place 2 sprays into both nostrils 4 (four) times daily. Patient not taking: Reported on 12/26/2017 11/30/17   Ok Edwards, PA-C  ondansetron (ZOFRAN) 4 MG tablet Take 1 tablet (4 mg total) by mouth every 8 (eight) hours as needed for nausea or vomiting. 12/25/17   Zigmund Gottron, NP     Allergies:    No Known Allergies   Physical Exam:    Vitals  Blood pressure (!) 112/94, pulse 85, temperature 98 F (36.7 C), temperature source Oral, resp. rate 18, SpO2 99 %, not currently breastfeeding.   1. General  lying in bed in NAD,   2. Normal affect and insight, Not Suicidal or Homicidal, Awake Alert, Oriented X 3.  3. No F.N deficits, ALL C.Nerves Intact, Strength 5/5 all 4 extremities, Sensation intact all 4 extremities, Plantars down going.  4. Ears and Eyes appear Normal, Conjunctivae clear, PERRLA. Moist Oral Mucosa.  5. Supple Neck, No JVD, No cervical lymphadenopathy appriciated, No Carotid Bruits.  6. Symmetrical Chest wall movement, Good air movement bilaterally, CTAB.  7. RRR, No Gallops, Rubs or Murmurs, No Parasternal Heave.  8. Positive Bowel Sounds, Abdomen Soft, No tenderness, No organomegaly appriciated,No rebound -guarding or rigidity.  9.  No Cyanosis, Normal Skin Turgor, No Skin Rash or Bruise.  10. Good muscle tone,  joints appear normal , no effusions, Normal ROM.  11. No Palpable Lymph Nodes in Neck or Axillae     Data Review:    CBC Recent Labs  Lab 12/26/17 1518  WBC 7.5  HGB 12.9  HCT 38.7  PLT 311  MCV 89.8  MCH 29.9  MCHC 33.3  RDW 12.6   ------------------------------------------------------------------------------------------------------------------  Chemistries  Recent Labs  Lab 12/26/17 1518  NA 138  K 3.6  CL 107  CO2 23  GLUCOSE 95  BUN 7  CREATININE 0.68  CALCIUM 9.2  AST 134*  ALT 121*  ALKPHOS 110  BILITOT 0.8   ------------------------------------------------------------------------------------------------------------------ CrCl cannot be calculated (Unknown ideal weight.). ------------------------------------------------------------------------------------------------------------------ No results for input(s): TSH, T4TOTAL, T3FREE, THYROIDAB in the last 72 hours.  Invalid input(s): FREET3  Coagulation profile No results for input(s): INR, PROTIME  in the last 168 hours. ------------------------------------------------------------------------------------------------------------------- No results for input(s): DDIMER in the last 72 hours. -------------------------------------------------------------------------------------------------------------------  Cardiac Enzymes No results for input(s): CKMB, TROPONINI, MYOGLOBIN in the last 168 hours.  Invalid input(s): CK ------------------------------------------------------------------------------------------------------------------ No results found for: BNP   ---------------------------------------------------------------------------------------------------------------  Urinalysis    Component Value Date/Time   COLORURINE YELLOW 12/26/2017 1518   APPEARANCEUR HAZY (A) 12/26/2017 1518   LABSPEC 1.026 12/26/2017 1518   PHURINE 6.0 12/26/2017 1518   GLUCOSEU NEGATIVE 12/26/2017 1518   HGBUR NEGATIVE 12/26/2017 1518   BILIRUBINUR NEGATIVE 12/26/2017 1518   KETONESUR NEGATIVE  12/26/2017 1518   PROTEINUR 30 (A) 12/26/2017 1518   UROBILINOGEN 0.2 12/25/2017 2103   NITRITE POSITIVE (A) 12/26/2017 1518   LEUKOCYTESUR MODERATE (A) 12/26/2017 1518    ----------------------------------------------------------------------------------------------------------------   Imaging Results:    Ct Abdomen Pelvis W Contrast  Result Date: 12/26/2017 CLINICAL DATA:  Centralized abdominal pain with nausea EXAM: CT ABDOMEN AND PELVIS WITH CONTRAST TECHNIQUE: Multidetector CT imaging of the abdomen and pelvis was performed using the standard protocol following bolus administration of intravenous contrast. CONTRAST:  175m ISOVUE-300 IOPAMIDOL (ISOVUE-300) INJECTION 61% COMPARISON:  None. FINDINGS: Lower chest: No acute abnormality. Hepatobiliary: No focal hepatic abnormality. Wall thickening or mild fluid around the gallbladder. No calcified stones. Mild intra and extrahepatic biliary enlargement,  extrahepatic bile duct measures up to 9 mm on coronal views. No definitive calcified stones along the course of the duct. Pancreas: No inflammation.  No ductal dilatation Spleen: Normal in size without focal abnormality. Adrenals/Urinary Tract: Adrenal glands are unremarkable. Kidneys are normal, without renal calculi, focal lesion, or hydronephrosis. Bladder is unremarkable. Stomach/Bowel: Stomach is within normal limits. Appendix appears normal. No evidence of bowel wall thickening, distention, or inflammatory changes. Vascular/Lymphatic: No significant vascular findings are present. No enlarged abdominal or pelvic lymph nodes. Reproductive: Uterus and bilateral adnexa are unremarkable. Other: Negative for free air or free fluid.  Fat in the umbilicus. Musculoskeletal: No acute or significant osseous findings. IMPRESSION: 1. Gallbladder wall thickening versus small amount of pericholecystic fluid. Suggest correlation with ultrasound. 2. Mild intra and extrahepatic biliary enlargement. Recommend correlation with LFTs with follow-up MRCP as indicated. Electronically Signed   By: KDonavan FoilM.D.   On: 12/26/2017 20:35   [place]domen Limited Ruq  Result Date: 12/26/2017 CLINICAL DATA:  Right upper quadrant pain abnormal CT EXAM: ULTRASOUND ABDOMEN LIMITED RIGHT UPPER QUADRANT COMPARISON:  CT 12/26/2017 FINDINGS: Gallbladder: Multiple small stones in the gallbladder measuring up to 9 mm. Wall thickening up to 5 mm. Negative sonographic Murphy. Common bile duct: Diameter: Within normal limits at 3.3 mm Liver: No focal lesion identified. Within normal limits in parenchymal echogenicity. Portal vein is patent on color Doppler imaging with normal direction of blood flow towards the liver. IMPRESSION: 1. Gallstones with wall thickening but negative sonographic Murphy's. Findings are nonspecific and could be seen with acute or chronic cholecystitis, liver disease, and hypoproteinemia/edema forming states. Hepatic  biliary nuclear medicine imaging could be obtained if there is clinical suspicion for cholecystitis. Electronically Signed   By: KDonavan FoilM.D.   On: 12/26/2017 21:48       Assessment & Plan:    Principal Problem:   Pancreatitis    Gallstone pancreatitis Mrcp Check LDH, check Lipid NPO Ns iv Surgery consult  Abnormal liver function Check acute hepatitis panel  Uti Await urine culture tx with cipro  iv bid  DVT Prophylaxis Lovenox SCDs   AM Labs Ordered, also please review Full Orders  Family Communication: Admission, patients condition and plan of care including tests being ordered have been discussed with the patient  who indicate understanding and agree with the plan and Code Status.  Code Status FULL CODE  Likely DC to  home  Condition GUARDED   Consults called: surgery by   Admission status: inpatient  Time spent in minutes : 45   JJani GravelM.D on 12/26/2017 at 10:30 PM  Between 7am to 7pm - Pager - [phone] . After 7pm go to [email or web address] - password [facility] - Office  [phone]

## 2017-12-26 NOTE — ED Notes (Signed)
patient reports having abdominal pain since delivering her child 2 months ago. Diarrhea and vomit once each 2 days ago. Pain is intermitent

## 2017-12-27 ENCOUNTER — Inpatient Hospital Stay (HOSPITAL_COMMUNITY): Payer: Medicaid Other

## 2017-12-27 DIAGNOSIS — K851 Biliary acute pancreatitis without necrosis or infection: Secondary | ICD-10-CM

## 2017-12-27 LAB — CBC
HEMATOCRIT: 34.1 % — AB (ref 36.0–46.0)
HEMOGLOBIN: 11.8 g/dL — AB (ref 12.0–15.0)
MCH: 31.1 pg (ref 26.0–34.0)
MCHC: 34.6 g/dL (ref 30.0–36.0)
MCV: 89.7 fL (ref 78.0–100.0)
Platelets: 247 10*3/uL (ref 150–400)
RBC: 3.8 MIL/uL — ABNORMAL LOW (ref 3.87–5.11)
RDW: 12.6 % (ref 11.5–15.5)
WBC: 4.2 10*3/uL (ref 4.0–10.5)

## 2017-12-27 LAB — COMPREHENSIVE METABOLIC PANEL
ALBUMIN: 3.4 g/dL — AB (ref 3.5–5.0)
ALK PHOS: 77 U/L (ref 38–126)
ALT: 71 U/L — ABNORMAL HIGH (ref 14–54)
ANION GAP: 8 (ref 5–15)
AST: 43 U/L — ABNORMAL HIGH (ref 15–41)
BILIRUBIN TOTAL: 0.5 mg/dL (ref 0.3–1.2)
BUN: <5 mg/dL — ABNORMAL LOW (ref 6–20)
CALCIUM: 8.5 mg/dL — AB (ref 8.9–10.3)
CO2: 22 mmol/L (ref 22–32)
Chloride: 108 mmol/L (ref 101–111)
Creatinine, Ser: 0.51 mg/dL (ref 0.44–1.00)
GFR calc Af Amer: >60 mL/min (ref >60)
GFR calc non Af Amer: >60 mL/min (ref >60)
GLUCOSE: 84 mg/dL (ref 65–99)
Potassium: 3.5 mmol/L (ref 3.5–5.1)
Sodium: 138 mmol/L (ref 135–145)
TOTAL PROTEIN: 5.8 g/dL — AB (ref 6.5–8.1)

## 2017-12-27 LAB — LIPID PANEL
CHOLESTEROL: 120 mg/dL (ref 0–200)
HDL: 40 mg/dL — ABNORMAL LOW (ref >40)
LDL CALC: 73 mg/dL (ref 0–99)
TRIGLYCERIDES: 36 mg/dL (ref <150)
Total CHOL/HDL Ratio: 3 RATIO
VLDL: 7 mg/dL (ref 0–40)

## 2017-12-27 LAB — URINE CULTURE

## 2017-12-27 LAB — LACTATE DEHYDROGENASE: LDH: 122 U/L (ref 98–192)

## 2017-12-27 LAB — LIPASE, BLOOD: LIPASE: 207 U/L — AB (ref 11–51)

## 2017-12-27 MED ORDER — ONDANSETRON HCL 4 MG PO TABS: 4.0000 mg | ORAL_TABLET | Freq: Three times a day (TID) | ORAL | 0 refills | Status: DC | PRN

## 2017-12-27 MED ORDER — SODIUM CHLORIDE 0.9 % IV SOLN
INTRAVENOUS | Status: DC
  Administered 2017-12-27: 09:00:00 via INTRAVENOUS

## 2017-12-27 MED ORDER — PANTOPRAZOLE SODIUM 40 MG PO TBEC
40.0000 mg | DELAYED_RELEASE_TABLET | Freq: Every day | ORAL | Status: DC
  Administered 2017-12-27: 40 mg via ORAL
  Filled 2017-12-27: qty 1

## 2017-12-27 MED ORDER — ENOXAPARIN SODIUM 40 MG/0.4ML ~~LOC~~ SOLN: 40.0000 mg | SUBCUTANEOUS | Status: DC

## 2017-12-27 MED ORDER — ONDANSETRON HCL 4 MG/2ML IJ SOLN: 4.0000 mg | Freq: Four times a day (QID) | INTRAMUSCULAR | Status: DC | PRN

## 2017-12-27 MED ORDER — OXYCODONE HCL 5 MG PO TABS: 5.0000 mg | ORAL_TABLET | ORAL | 0 refills | Status: DC | PRN

## 2017-12-27 MED ORDER — OMEPRAZOLE 20 MG PO CPDR: 20.0000 mg | DELAYED_RELEASE_CAPSULE | Freq: Two times a day (BID) | ORAL | 1 refills | Status: DC

## 2017-12-27 MED ORDER — GADOBENATE DIMEGLUMINE 529 MG/ML IV SOLN
10.0000 mL | Freq: Once | INTRAVENOUS | Status: AC
  Administered 2017-12-27: 10 mL via INTRAVENOUS

## 2017-12-27 MED ORDER — CEPHALEXIN 500 MG PO CAPS: 500.0000 mg | ORAL_CAPSULE | Freq: Three times a day (TID) | ORAL | 0 refills | Status: AC

## 2017-12-27 NOTE — ED Notes (Signed)
Pt alert and denies any discomfort. Adorable 232 month old child lying quietly on patients chest.

## 2017-12-27 NOTE — Progress Notes (Signed)
[facility] Progress Note     Subjective: CC- tired No complaints this morning. States that her pain has fully resolved. Denies n/v. Asking for something to eat. No prior h/o abdominal surgery.  Works as a Training and development officer.   Objective: Vital signs in last 24 hours: Temp:  [98 F (36.7 C)] 98 F (36.7 C) (03/26 1503) Pulse Rate:  [66-100] 76 (03/27 0900) Resp:  [0-27] 20 (03/27 0900) BP: (69-138)/(33-115) 129/91 (03/27 0900) SpO2:  [98 %-100 %] 100 % (03/27 0900)    Intake/Output from previous day: 03/26 0701 - 03/27 0700 In: 1000 [IV Piggyback:1000] Out: -  Intake/Output this shift: No intake/output data recorded.  PE: Gen:  Alert, NAD, pleasant HEENT: EOM's intact, pupils equal and round Card:  RRR, no M/G/R heard Pulm:  CTAB, no W/R/R, effort normal Abd: Soft, ND, nontender to light and deep palpation, +BS, no HSM, no hernia Ext:  No erythema, edema, or tenderness BUE/BLE  Psych: A&Ox3  Skin: no rashes noted, warm and dry  Lab Results:  Recent Labs    12/26/17 1518 12/27/17 0606  WBC 7.5 4.2  HGB 12.9 11.8*  HCT 38.7 34.1*  PLT 311 247   BMET Recent Labs    12/26/17 1518 12/27/17 0318  NA 138 138  K 3.6 3.5  CL 107 108  CO2 23 22  GLUCOSE 95 84  BUN 7 <5*  CREATININE 0.68 0.51  CALCIUM 9.2 8.5*   PT/INR No results for input(s): LABPROT, INR in the last 72 hours. CMP     Component Value Date/Time   NA 138 12/27/2017 0318   K 3.5 12/27/2017 0318   CL 108 12/27/2017 0318   CO2 22 12/27/2017 0318   GLUCOSE 84 12/27/2017 0318   BUN <5 (L) 12/27/2017 0318   CREATININE 0.51 12/27/2017 0318   CALCIUM 8.5 (L) 12/27/2017 0318   PROT 5.8 (L) 12/27/2017 0318   ALBUMIN 3.4 (L) 12/27/2017 0318   AST 43 (H) 12/27/2017 0318   ALT 71 (H) 12/27/2017 0318   ALKPHOS 77 12/27/2017 0318   BILITOT 0.5 12/27/2017 0318   GFRNONAA >60 12/27/2017 0318   GFRAA >60 12/27/2017 0318   Lipase     Component Value Date/Time   LIPASE 207 (H) 12/27/2017 0318        Studies/Results: Ct Abdomen Pelvis W Contrast  Result Date: 12/26/2017 CLINICAL DATA:  Centralized abdominal pain with nausea EXAM: CT ABDOMEN AND PELVIS WITH CONTRAST TECHNIQUE: Multidetector CT imaging of the abdomen and pelvis was performed using the standard protocol following bolus administration of intravenous contrast. CONTRAST:  161m ISOVUE-300 IOPAMIDOL (ISOVUE-300) INJECTION 61% COMPARISON:  None. FINDINGS: Lower chest: No acute abnormality. Hepatobiliary: No focal hepatic abnormality. Wall thickening or mild fluid around the gallbladder. No calcified stones. Mild intra and extrahepatic biliary enlargement, extrahepatic bile duct measures up to 9 mm on coronal views. No definitive calcified stones along the course of the duct. Pancreas: No inflammation.  No ductal dilatation Spleen: Normal in size without focal abnormality. Adrenals/Urinary Tract: Adrenal glands are unremarkable. Kidneys are normal, without renal calculi, focal lesion, or hydronephrosis. Bladder is unremarkable. Stomach/Bowel: Stomach is within normal limits. Appendix appears normal. No evidence of bowel wall thickening, distention, or inflammatory changes. Vascular/Lymphatic: No significant vascular findings are present. No enlarged abdominal or pelvic lymph nodes. Reproductive: Uterus and bilateral adnexa are unremarkable. Other: Negative for free air or free fluid.  Fat in the umbilicus. Musculoskeletal: No acute or significant osseous findings. IMPRESSION: 1. Gallbladder wall thickening versus  small amount of pericholecystic fluid. Suggest correlation with ultrasound. 2. Mild intra and extrahepatic biliary enlargement. Recommend correlation with LFTs with follow-up MRCP as indicated. Electronically Signed   By: Donavan Foil M.D.   On: 12/26/2017 20:35   Mr 3d Recon At Scanner  Result Date: 12/27/2017 CLINICAL DATA:  Gallbladder wall thickening.  Gallstones. EXAM: MRI ABDOMEN WITHOUT AND WITH CONTRAST (INCLUDING  MRCP) TECHNIQUE: Multiplanar multisequence MR imaging of the abdomen was performed both before and after the administration of intravenous contrast. Heavily T2-weighted images of the biliary and pancreatic ducts were obtained, and three-dimensional MRCP images were rendered by post processing. CONTRAST:  70m MULTIHANCE GADOBENATE DIMEGLUMINE 529 MG/ML IV SOLN COMPARISON:  Ultrasound 12/26/2017 FINDINGS: Lower chest: No acute findings. Hepatobiliary: No focal liver abnormality identified. Mild diffuse gallbladder wall thickening identified measuring up to 4 mm. Small stones within the dependent portion of the gallbladder are noted measuring up to 3 mm, image 20/4001. There is mild fusiform dilatation of the CBD which measures up to 7 mm. No choledocholithiasis or mass identified. Pancreas: No mass, inflammatory changes, or other parenchymal abnormality identified. Spleen:  Within normal limits in size and appearance. Adrenals/Urinary Tract: The adrenal glands appear normal. Kidneys are normal. No mass or hydronephrosis identified. Stomach/Bowel: Visualized portions within the abdomen are unremarkable. Vascular/Lymphatic: Normal appearance of the abdominal aorta. The hepatic veins and portal veins are patent. No enlarged lymph nodes within the upper abdomen. Other:  No free fluid or fluid collections. Musculoskeletal: No suspicious bone lesions identified. IMPRESSION: 1. Mild gallbladder wall thickening and gallstones. Imaging findings may reflect acute cholecystitis. 2. Mild increase caliber of the common bile duct which measures 7 mm. No choledocholithiasis identified. Electronically Signed   By: TKerby MoorsM.D.   On: 12/27/2017 09:45   Mr Abdomen Mrcp WMoise BoringContast  Result Date: 12/27/2017 CLINICAL DATA:  Gallbladder wall thickening.  Gallstones. EXAM: MRI ABDOMEN WITHOUT AND WITH CONTRAST (INCLUDING MRCP) TECHNIQUE: Multiplanar multisequence MR imaging of the abdomen was performed both before and after the  administration of intravenous contrast. Heavily T2-weighted images of the biliary and pancreatic ducts were obtained, and three-dimensional MRCP images were rendered by post processing. CONTRAST:  159mMULTIHANCE GADOBENATE DIMEGLUMINE 529 MG/ML IV SOLN COMPARISON:  Ultrasound 12/26/2017 FINDINGS: Lower chest: No acute findings. Hepatobiliary: No focal liver abnormality identified. Mild diffuse gallbladder wall thickening identified measuring up to 4 mm. Small stones within the dependent portion of the gallbladder are noted measuring up to 3 mm, image 20/4001. There is mild fusiform dilatation of the CBD which measures up to 7 mm. No choledocholithiasis or mass identified. Pancreas: No mass, inflammatory changes, or other parenchymal abnormality identified. Spleen:  Within normal limits in size and appearance. Adrenals/Urinary Tract: The adrenal glands appear normal. Kidneys are normal. No mass or hydronephrosis identified. Stomach/Bowel: Visualized portions within the abdomen are unremarkable. Vascular/Lymphatic: Normal appearance of the abdominal aorta. The hepatic veins and portal veins are patent. No enlarged lymph nodes within the upper abdomen. Other:  No free fluid or fluid collections. Musculoskeletal: No suspicious bone lesions identified. IMPRESSION: 1. Mild gallbladder wall thickening and gallstones. Imaging findings may reflect acute cholecystitis. 2. Mild increase caliber of the common bile duct which measures 7 mm. No choledocholithiasis identified. Electronically Signed   By: TaKerby Moors.D.   On: 12/27/2017 09:45   UsKoreabdomen Limited Ruq  Result Date: 12/26/2017 CLINICAL DATA:  Right upper quadrant pain abnormal CT EXAM: ULTRASOUND ABDOMEN LIMITED RIGHT UPPER QUADRANT COMPARISON:  CT 12/26/2017  FINDINGS: Gallbladder: Multiple small stones in the gallbladder measuring up to 9 mm. Wall thickening up to 5 mm. Negative sonographic Murphy. Common bile duct: Diameter: Within normal limits at 3.3 mm  Liver: No focal lesion identified. Within normal limits in parenchymal echogenicity. Portal vein is patent on color Doppler imaging with normal direction of blood flow towards the liver. IMPRESSION: 1. Gallstones with wall thickening but negative sonographic Murphy's. Findings are nonspecific and could be seen with acute or chronic cholecystitis, liver disease, and hypoproteinemia/edema forming states. Hepatic biliary nuclear medicine imaging could be obtained if there is clinical suspicion for cholecystitis. Electronically Signed   By: Donavan Foil M.D.   On: 12/26/2017 21:48    Anti-infectives: Anti-infectives (From admission, onward)   Start     Dose/Rate Route Frequency Ordered Stop   12/27/17 0000  ciprofloxacin (CIPRO) IVPB 400 mg     400 mg 200 mL/hr over 60 Minutes Intravenous Every 12 hours 12/26/17 2325         Assessment/Plan GERD 2 months postpartum  Likely Gallstone Pancreatitis - no prior h/o abdominal surgery - abdominal u/s shows Gallstones with wall thickening but negative sonographic Murphy's - MRCP shows mild gallbladder wall thickening and gallstones, common bile duct measures 38m - lipase 10,000 on admission now down to 207 - transaminases slightly elevated, alk phos and bili WNL  ID - cipro 3/27>> FEN - IVF, NPO VTE - SCDs, lovenox Foley - none Follow up - TBD  Plan - Lipase and transaminases trending down. Pain fully resolved. She is NPO this morning. Will discuss timing of surgery with MD. Keep NPO and hold lovenox. Repeat CBC/CMP/lipase in AM.   LOS: 1 day    BWellington Hampshire, [facility] 12/27/2017, 9:59 AM Pager: [phone]: [phone]-Fri 7:00 am-4:30 pm Sat-Sun 7:00 am-11:30 am

## 2017-12-27 NOTE — Discharge Summary (Addendum)
Julie [name], is a 22 y.o. female  DOB 30-Jun-1996  MRN [id number].  Admission date:  12/26/2017  Admitting Physician  No admitting provider for patient encounter.  Discharge Date:  12/27/2017   Primary MD  Patient, No Pcp Per  Recommendations for primary care physician for things to follow:   Make appointment with general surgeon as advised for possible removal of your gallbladder due to gallstones Avoid greasy food/low-fat diet advised Omeprazole twice a day as advised  Admission Diagnosis  Abd pain   Discharge Diagnosis  Abd pain    Principal Problem:   Gallstone pancreatitis      Past Medical History:  Diagnosis Date  . GERD (gastroesophageal reflux disease)     Past Surgical History:  Procedure Laterality Date  . NO PAST SURGERIES         HPI  from the history and physical done on the day of admission:     Julie [name]  is a 22 y.o. female, w Julie [name] apparently c/o mid abdominal pain intermittently for the past 2 weeks.  Worse today and some n/v x1 , 2 days ago.  Denies fever, chills, diarrhea, brbpr, black stool.  Pt states presented to ED due to worsening pain.    In ED,  Ultrasound=> IMPRESSION: 1. Gallstones with wall thickening but negative sonographic Murphy's. Findings are nonspecific and could be seen with acute or chronic cholecystitis, liver disease, and hypoproteinemia/edema forming states. Hepatic biliary nuclear medicine imaging could be obtained if there is clinical suspicion for cholecystitis.  CT abd pelvis IMPRESSION: 1. Gallbladder wall thickening versus small amount of pericholecystic fluid. Suggest correlation with ultrasound. 2. Mild intra and extrahepatic biliary enlargement. Recommend correlation with LFTs with follow-up MRCP as indicated.       Hospital Course:   1)Gallstone Pancreatitis-clinically much improved, abdominal pain is resolved,  no nausea no vomiting, abdominal ultrasound, CT abdomen and pelvis, and MRCP noted with findings of mild gallbladder wall thickening and gallstones, CBD measures 7 mm, lipase is down to 207 from over 10 times on admission, LFTs are trending down, alkaline phosphatase and bilirubin are normal, surgical consult appreciated, patient will follow-up as outpatient with general surgery to discuss timing of elective lap chole.  Low-fat/non-greasy foods as advised, PPI as advised, oxycodone as needed  2) possible UTI-treat empirically with Keflex 500 3 times daily for 5 days pending cultures  3) postpartum state-patient is 2 months postpartum, denies any Gyn/OB questions  Inpatient status Patient was initially admitted to inpatient service due to severity of presenting complaints and very elevated LFTs and abnormal labs and clinical findings.  It appears patient might have passed a stone because her clinical picture and lab findings improved significantly in a very short time.  Patient met inpatient criteria at the time of admission  Discharge Condition: Stable  Follow UP  Follow-up Information    Fanny Skates, MD. Go on 01/05/2018.   Specialty:  General Surgery Why:  Your appointment is 01/05/2018 at 4:15PM to discuss having your gallbladder removed. Please  arrive 30 minutes prior to your appointment to check in and fill out paperwork. Bring photo ID and insurance information. Contact information: [place] [address] [phone]            Consults obtained -general surgery Diet and Activity recommendation:  As advised  Discharge Instructions     Discharge Instructions    Call MD for:  difficulty breathing, headache or visual disturbances   Complete by:  As directed    Call MD for:  persistant dizziness or light-headedness   Complete by:  As directed    Call MD for:  persistant nausea and vomiting   Complete by:  As directed    Call MD for:  severe  uncontrolled pain   Complete by:  As directed    Call MD for:  temperature >100.4   Complete by:  As directed    Diet general   Complete by:  As directed    Low-fat diet advised, avoid greasy food   Discharge instructions   Complete by:  As directed    Make appointment with general surgeon as advised for possible removal of your gallbladder due to gallstones Avoid greasy food/low-fat diet advised Omeprazole twice a day as advised   Increase activity slowly   Complete by:  As directed         Discharge Medications     Allergies as of 12/27/2017   No Known Allergies     Medication List    TAKE these medications   acetaminophen 500 MG tablet Commonly known as:  TYLENOL Take 1,000 mg by mouth every 6 (six) hours as needed for moderate pain.   cephALEXin 500 MG capsule Commonly known as:  KEFLEX Take 1 capsule (500 mg total) by mouth 3 (three) times daily for 5 days.   cetirizine 10 MG tablet Commonly known as:  ZYRTEC Take 1 tablet (10 mg total) by mouth daily.   fluticasone 50 MCG/ACT nasal spray Commonly known as:  FLONASE Place 2 sprays into both nostrils daily.   ipratropium 0.06 % nasal spray Commonly known as:  ATROVENT Place 2 sprays into both nostrils 4 (four) times daily.   omeprazole 20 MG capsule Commonly known as:  PRILOSEC Take 1 capsule (20 mg total) by mouth 2 (two) times daily before a meal. What changed:  when to take this   ondansetron 4 MG tablet Commonly known as:  ZOFRAN Take 1 tablet (4 mg total) by mouth every 8 (eight) hours as needed for nausea or vomiting.   oxyCODONE 5 MG immediate release tablet Commonly known as:  ROXICODONE Take 1 tablet (5 mg total) by mouth every 4 (four) hours as needed for severe pain.       Major procedures and Radiology Reports - PLEASE review detailed and final reports for all details, in brief -   Ct Abdomen Pelvis W Contrast  Result Date: 12/26/2017 CLINICAL DATA:  Centralized abdominal pain with  nausea EXAM: CT ABDOMEN AND PELVIS WITH CONTRAST TECHNIQUE: Multidetector CT imaging of the abdomen and pelvis was performed using the standard protocol following bolus administration of intravenous contrast. CONTRAST:  170m ISOVUE-300 IOPAMIDOL (ISOVUE-300) INJECTION 61% COMPARISON:  None. FINDINGS: Lower chest: No acute abnormality. Hepatobiliary: No focal hepatic abnormality. Wall thickening or mild fluid around the gallbladder. No calcified stones. Mild intra and extrahepatic biliary enlargement, extrahepatic bile duct measures up to 9 mm on coronal views. No definitive calcified stones along the course of the duct. Pancreas: No  inflammation.  No ductal dilatation Spleen: Normal in size without focal abnormality. Adrenals/Urinary Tract: Adrenal glands are unremarkable. Kidneys are normal, without renal calculi, focal lesion, or hydronephrosis. Bladder is unremarkable. Stomach/Bowel: Stomach is within normal limits. Appendix appears normal. No evidence of bowel wall thickening, distention, or inflammatory changes. Vascular/Lymphatic: No significant vascular findings are present. No enlarged abdominal or pelvic lymph nodes. Reproductive: Uterus and bilateral adnexa are unremarkable. Other: Negative for free air or free fluid.  Fat in the umbilicus. Musculoskeletal: No acute or significant osseous findings. IMPRESSION: 1. Gallbladder wall thickening versus small amount of pericholecystic fluid. Suggest correlation with ultrasound. 2. Mild intra and extrahepatic biliary enlargement. Recommend correlation with LFTs with follow-up MRCP as indicated. Electronically Signed   By: Donavan Foil M.D.   On: 12/26/2017 20:35   Mr 3d Recon At Scanner  Result Date: 12/27/2017 CLINICAL DATA:  Gallbladder wall thickening.  Gallstones. EXAM: MRI ABDOMEN WITHOUT AND WITH CONTRAST (INCLUDING MRCP) TECHNIQUE: Multiplanar multisequence MR imaging of the abdomen was performed both before and after the administration of  intravenous contrast. Heavily T2-weighted images of the biliary and pancreatic ducts were obtained, and three-dimensional MRCP images were rendered by post processing. CONTRAST:  84m MULTIHANCE GADOBENATE DIMEGLUMINE 529 MG/ML IV SOLN COMPARISON:  Ultrasound 12/26/2017 FINDINGS: Lower chest: No acute findings. Hepatobiliary: No focal liver abnormality identified. Mild diffuse gallbladder wall thickening identified measuring up to 4 mm. Small stones within the dependent portion of the gallbladder are noted measuring up to 3 mm, image 20/4001. There is mild fusiform dilatation of the CBD which measures up to 7 mm. No choledocholithiasis or mass identified. Pancreas: No mass, inflammatory changes, or other parenchymal abnormality identified. Spleen:  Within normal limits in size and appearance. Adrenals/Urinary Tract: The adrenal glands appear normal. Kidneys are normal. No mass or hydronephrosis identified. Stomach/Bowel: Visualized portions within the abdomen are unremarkable. Vascular/Lymphatic: Normal appearance of the abdominal aorta. The hepatic veins and portal veins are patent. No enlarged lymph nodes within the upper abdomen. Other:  No free fluid or fluid collections. Musculoskeletal: No suspicious bone lesions identified. IMPRESSION: 1. Mild gallbladder wall thickening and gallstones. Imaging findings may reflect acute cholecystitis. 2. Mild increase caliber of the common bile duct which measures 7 mm. No choledocholithiasis identified. Electronically Signed   By: TKerby MoorsM.D.   On: 12/27/2017 09:45   Mr Abdomen Mrcp WMoise BoringContast  Result Date: 12/27/2017 CLINICAL DATA:  Gallbladder wall thickening.  Gallstones. EXAM: MRI ABDOMEN WITHOUT AND WITH CONTRAST (INCLUDING MRCP) TECHNIQUE: Multiplanar multisequence MR imaging of the abdomen was performed both before and after the administration of intravenous contrast. Heavily T2-weighted images of the biliary and pancreatic ducts were obtained, and  three-dimensional MRCP images were rendered by post processing. CONTRAST:  15mMULTIHANCE GADOBENATE DIMEGLUMINE 529 MG/ML IV SOLN COMPARISON:  Ultrasound 12/26/2017 FINDINGS: Lower chest: No acute findings. Hepatobiliary: No focal liver abnormality identified. Mild diffuse gallbladder wall thickening identified measuring up to 4 mm. Small stones within the dependent portion of the gallbladder are noted measuring up to 3 mm, image 20/4001. There is mild fusiform dilatation of the CBD which measures up to 7 mm. No choledocholithiasis or mass identified. Pancreas: No mass, inflammatory changes, or other parenchymal abnormality identified. Spleen:  Within normal limits in size and appearance. Adrenals/Urinary Tract: The adrenal glands appear normal. Kidneys are normal. No mass or hydronephrosis identified. Stomach/Bowel: Visualized portions within the abdomen are unremarkable. Vascular/Lymphatic: Normal appearance of the abdominal aorta. The hepatic veins and portal veins are  patent. No enlarged lymph nodes within the upper abdomen. Other:  No free fluid or fluid collections. Musculoskeletal: No suspicious bone lesions identified. IMPRESSION: 1. Mild gallbladder wall thickening and gallstones. Imaging findings may reflect acute cholecystitis. 2. Mild increase caliber of the common bile duct which measures 7 mm. No choledocholithiasis identified. Electronically Signed   By: Kerby Moors M.D.   On: 12/27/2017 09:45   US Abdomen Limited Ruq  Result Date: 12/26/2017 CLINICAL DATA:  Right upper quadrant pain abnormal CT EXAM: ULTRASOUND ABDOMEN LIMITED RIGHT UPPER QUADRANT COMPARISON:  CT 12/26/2017 FINDINGS: Gallbladder: Multiple small stones in the gallbladder measuring up to 9 mm. Wall thickening up to 5 mm. Negative sonographic Murphy. Common bile duct: Diameter: Within normal limits at 3.3 mm Liver: No focal lesion identified. Within normal limits in parenchymal echogenicity. Portal vein is patent on color  Doppler imaging with normal direction of blood flow towards the liver. IMPRESSION: 1. Gallstones with wall thickening but negative sonographic Murphy's. Findings are nonspecific and could be seen with acute or chronic cholecystitis, liver disease, and hypoproteinemia/edema forming states. Hepatic biliary nuclear medicine imaging could be obtained if there is clinical suspicion for cholecystitis. Electronically Signed   By: Donavan Foil M.D.   On: 12/26/2017 21:48    Micro Results    Recent Results (from the past 240 hour(s))  Urine culture     Status: Abnormal   Collection Time: 12/25/17  9:13 PM  Result Value Ref Range Status   Specimen Description URINE, RANDOM  Final   Special Requests NONE  Final   Culture (A)  Final    >=100,000 COLONIES/mL DIPHTHEROIDS(CORYNEBACTERIUM SPECIES) Standardized susceptibility testing for this organism is not available. Performed at [facility], [place] [address] [place], [place] [address]    Report Status 12/27/2017 FINAL  Final       Today   Subjective    Julie [name] today has no new concerns, patient's significant other, 43-monthold baby  at bedside, questions answered   .  No further abdominal pain at this time, no nausea no vomiting, eager to go home       Patient has been seen and examined prior to discharge   Objective   Blood pressure 104/90, pulse 82, temperature 98 F (36.7 C), temperature source Oral, resp. rate (!) 21, SpO2 96 %, not currently breastfeeding.   Intake/Output Summary (Last 24 hours) at 12/27/2017 1349 Last data filed at 12/26/2017 2206 Gross per 24 hour  Intake 1000 ml  Output -  Net 1000 ml    Exam Gen:- Awake Alert, in no acute distress HEENT:- [place].AT, No sclera icterus Neck-Supple Neck,No JVD,.  Lungs-  CTAB and movement is good and symmetrical CV- S1, S2 normal Abd-  +ve B.Sounds, Abd Soft, No tenderness,    Extremity/Skin:- No  edema,   good pulses Psych-affect is appropriate, oriented  x3 Neuro-no new focal deficits, no tremors   Data Review   CBC w Diff:  Lab Results  Component Value Date   WBC 4.2 12/27/2017   HGB 11.8 (L) 12/27/2017   HGB 11.7 07/17/2017   HCT 34.1 (L) 12/27/2017   HCT 34.6 07/17/2017   PLT 247 12/27/2017   PLT 263 07/17/2017    CMP:  Lab Results  Component Value Date   NA 138 12/27/2017   K 3.5 12/27/2017   CL 108 12/27/2017   CO2 22 12/27/2017   BUN <5 (L) 12/27/2017   CREATININE 0.51 12/27/2017   PROT 5.8 (L) 12/27/2017  ALBUMIN 3.4 (L) 12/27/2017   BILITOT 0.5 12/27/2017   ALKPHOS 77 12/27/2017   AST 43 (H) 12/27/2017   ALT 71 (H) 12/27/2017   Total Discharge time is about 33 minutes  Roxan Hockey M.D on 12/27/2017 at 1:49 PM  [facility]   Office  [phone]  Voice Recognition Viviann Spare dictation system was used to create this note, attempts have been made to correct errors. Please contact the author with questions and/or clarifications.

## 2017-12-27 NOTE — Discharge Instructions (Addendum)
Make appointment with general surgeon as advised for possible removal of your gallbladder due to gallstones Avoid greasy food/low-fat diet advised Omeprazole twice a day as advised   Low-Fat Diet for Pancreatitis or Gallbladder Conditions A low-fat diet can be helpful if you have pancreatitis or a gallbladder condition. With these conditions, your pancreas and gallbladder have trouble digesting fats. A healthy eating plan with less fat will help rest your pancreas and gallbladder and reduce your symptoms. What do I need to know about this diet?  Eat a low-fat diet. ? Reduce your fat intake to less than 20-30% of your total daily calories. This is less than 50-60 g of fat per day. ? Remember that you need some fat in your diet. Ask your dietician what your daily goal should be. ? Choose nonfat and low-fat healthy foods. Look for the words nonfat, low fat, or fat free. ? As a guide, look on the label and choose foods with less than 3 g of fat per serving. Eat only one serving.  Avoid alcohol.  Do not smoke. If you need help quitting, talk with your health care provider.  Eat small frequent meals instead of three large heavy meals. What foods can I eat? Grains Include healthy grains and starches such as potatoes, wheat bread, fiber-rich cereal, and brown rice. Choose whole grain options whenever possible. In adults, whole grains should account for 45-65% of your daily calories. Fruits and Vegetables Eat plenty of fruits and vegetables. Fresh fruits and vegetables add fiber to your diet. Meats and Other Protein Sources Eat lean meat such as chicken and pork. Trim any fat off of meat before cooking it. Eggs, fish, and beans are other sources of protein. In adults, these foods should account for 10-35% of your daily calories. Dairy Choose low-fat milk and dairy options. Dairy includes fat and protein, as well as calcium. Fats and Oils Limit high-fat foods such as fried foods, sweets,  baked goods, sugary drinks. Other Creamy sauces and condiments, such as mayonnaise, can add extra fat. Think about whether or not you need to use them, or use smaller amounts or low fat options. What foods are not recommended?  High fat foods, such as: ? Tesoro CorporationBaked goods. ? Ice cream. ? JamaicaFrench toast. ? Sweet rolls. ? Pizza. ? Cheese bread. ? Foods covered with batter, butter, creamy sauces, or cheese. ? Fried foods. ? Sugary drinks and desserts.  Foods that cause gas or bloating This information is not intended to replace advice given to you by your health care provider. Make sure you discuss any questions you have with your health care provider. Document Released: 09/24/2013 Document Revised: 02/25/2016 Document Reviewed: 09/02/2013 Elsevier Interactive Patient Education  2017 Elsevier Inc.]  Make appointment with general surgeon as advised for possible removal of your gallbladder due to gallstones Avoid greasy food/low-fat diet advised Omeprazole twice a day as advised

## 2017-12-27 NOTE — ED Notes (Signed)
Dr. Valerie SaltsEmopkae at bedside.

## 2017-12-27 NOTE — ED Notes (Signed)
Sug MD at bedsdie

## 2017-12-28 LAB — HEPATITIS PANEL, ACUTE
HEP B S AG: NEGATIVE
Hep A IgM: NEGATIVE
Hep B C IgM: NEGATIVE

## 2018-01-05 ENCOUNTER — Other Ambulatory Visit: Payer: Self-pay | Admitting: General Surgery

## 2018-01-07 NOTE — H&P (Signed)
Julie SanderShanteka [name] [facility] Patient #: [id number] DOB: [birth year] Single / Language: [place] / Race: Black or African American Female        History of Present Illness        This is a 22 year old female, 2 months postpartum, engaged to be married who presented to the emergency department with gallstone pancreatitis. She had been having abdominal pain off and on for a few weeks. She had a couple of episodes of nausea and vomiting. She had 1 episode of diarrhea. The pain got worse and she came to the emergency department where her lipase was greater than 10,000 and her AST and ALT were slightly elevated. Hemoglobin and WBC were normal. Labs were repeated a few hours later and the lipase had gone down to 200 and her pain had resolved. Ultrasound showed gallstones and slight gallbladder wall thickening, nonspecific. CT abdomen and pelvis suggestive acute cholecystitis with mild wall thickening. Pancreas did not look inflamed. MRI showed mild gallbladder wall thickening and gallstones. CBD borderline dilated to 7 mm but there was no common bile duct stones. She did not want to be admitted and went home and follow-up was arranged with me.  She has done well since she went home. She is avoiding fatty foods as instructed. She does not drink alcohol. She is bottle feeding her 262 month old. The child is doing well. Her fianc is parents live in town. She is motivated for elective cholecystectomy      Past history is negative for any surgery. She does have reflux symptoms. She is 2 months postpartum Family history reveals that her mother had cholecystectomy Social history reveals that she is engaged to be married. Has had the single pregnancy. Denies tobacco or alcohol. She works at [employer]. I told her she could go back to work before she had a cholecystectomy and a week or 2 postop.      I went over the pathophysiology of her gallstone pancreatitis with her.  I went over patient information booklet in great detail. She will be scheduled for laparoscopic cholecystectomy with cholangiogram, possible open cholecystectomy. I discussed the indications, details, techniques, and numerous risk of the surgery with her and her partner. She is aware of the risk of bleeding, infection, conversion to open laparotomy, port site hernia and bowel obstruction, bile leak, injury to adjacent organs with major reconstructive surgery, hospital admission for management of common bile duct stones and other unforeseen problems. She understands these issues well. All the questions were answered. She agrees with this plan. I gave her the patient information booklet and pointed out several important features.   Past Surgical History  No pertinent past surgical history   Diagnostic Studies History Pap Smear  never  Allergies  No Known Drug Allergies  Allergies Reconciled   Medication History  Cephalexin (500MG  Capsule, Oral) Active. OxyCODONE HCl (5MG  Tablet, Oral) Active. Omeprazole (20MG  Capsule DR, Oral) Active. Ondansetron HCl (4MG  Tablet, Oral) Active. Medications Reconciled  Social History  No alcohol use  No caffeine use  No drug use  Tobacco use  Never smoker.  Family History  Diabetes Mellitus  Mother. Heart Disease  Mother. Malignant Neoplasm Of Pancreas  Father. Prostate Cancer  Father.  Pregnancy / Birth History  Age at menarche  12 years. Gravida  1 Maternal age  20-20 Para  1 Regular periods   Other Problems  Cholelithiasis     Review of Systems  General Not Present- Appetite Loss, Chills, Fatigue, Fever, Night Sweats,  Weight Gain and Weight Loss. Skin Not Present- Change in Wart/Mole, Dryness, Hives, Jaundice, New Lesions, Non-Healing Wounds, Rash and Ulcer. HEENT Not Present- Earache, Hearing Loss, Hoarseness, Nose Bleed, Oral Ulcers, Ringing in the Ears, Seasonal Allergies, Sinus Pain, Sore Throat, Visual  Disturbances, Wears glasses/contact lenses and Yellow Eyes. Respiratory Not Present- Bloody sputum, Chronic Cough, Difficulty Breathing, Snoring and Wheezing. Cardiovascular Not Present- Chest Pain, Difficulty Breathing Lying Down, Leg Cramps, Palpitations, Rapid Heart Rate, Shortness of Breath and Swelling of Extremities. Gastrointestinal Not Present- Abdominal Pain, Bloating, Bloody Stool, Change in Bowel Habits, Chronic diarrhea, Constipation, Difficulty Swallowing, Excessive gas, Gets full quickly at meals, Hemorrhoids, Indigestion, Nausea, Rectal Pain and Vomiting. Female Genitourinary Not Present- Frequency, Nocturia, Painful Urination, Pelvic Pain and Urgency. Musculoskeletal Not Present- Back Pain, Joint Pain, Joint Stiffness, Muscle Pain, Muscle Weakness and Swelling of Extremities. Psychiatric Not Present- Anxiety, Bipolar, Change in Sleep Pattern, Depression, Fearful and Frequent crying. Endocrine Not Present- Cold Intolerance, Excessive Hunger, Hair Changes, Heat Intolerance, Hot flashes and New Diabetes. Hematology Not Present- Blood Thinners, Easy Bruising, Excessive bleeding, Gland problems, HIV and Persistent Infections.  Vitals  Weight: 125 lb (44th percentile) Height: 65in (61st percentile) Body Surface Area: 1.62 m Body Mass Index: 20.8 kg/m  (39th percentile)  Temp.: 98.77F  Pulse: 74 (Regular)  BP: 116/72 (Sitting, Left Arm, Standard)  Percentiles calculated using CDC data for children 2-20 years.     Physical Exam General Mental Status-Alert. General Appearance-Consistent with stated age. Hydration-Well hydrated. Voice-Normal.  Head and Neck Head-normocephalic, atraumatic with no lesions or palpable masses. Trachea-midline. Thyroid Gland Characteristics - normal size and consistency.  Eye Eyeball - Bilateral-Extraocular movements intact. Sclera/Conjunctiva - Bilateral-No scleral icterus.  Chest and Lung Exam Chest and  lung exam reveals -quiet, even and easy respiratory effort with no use of accessory muscles and on auscultation, normal breath sounds, no adventitious sounds and normal vocal resonance. Inspection Chest Wall - Normal. Back - normal.  Cardiovascular Cardiovascular examination reveals -normal heart sounds, regular rate and rhythm with no murmurs and normal pedal pulses bilaterally.  Abdomen Inspection Inspection of the abdomen reveals - No Hernias. Skin - Scar - no surgical scars. Palpation/Percussion Palpation and Percussion of the abdomen reveal - Soft, Non Tender, No Rebound tenderness, No Rigidity (guarding) and No hepatosplenomegaly. Auscultation Auscultation of the abdomen reveals - Bowel sounds normal. Note: Completely soft, nontender, benign.   Neurologic Neurologic evaluation reveals -alert and oriented x 3 with no impairment of recent or remote memory. Mental Status-Normal.  Musculoskeletal Normal Exam - Left-Upper Extremity Strength Normal and Lower Extremity Strength Normal. Normal Exam - Right-Upper Extremity Strength Normal and Lower Extremity Strength Normal.  Lymphatic Head & Neck  General Head & Neck Lymphatics: Bilateral - Description - Normal. Axillary  General Axillary Region: Bilateral - Description - Normal. Tenderness - Non Tender. Femoral & Inguinal  Generalized Femoral & Inguinal Lymphatics: Bilateral - Description - Normal. Tenderness - Non Tender.    Assessment & Plan  GALLSTONE PANCREATITIS (K85.10)   When you were evaluated in the emergency department you were found to have a condition called gallstone pancreatitis You have gallstones in your gallbladder and your pancreas lab tests were transiently elevated Your liver tests were slightly elevated I suspect you passed a stone out of your gallbladder that temporarily blocked and inflamed your pancreas  This has now resolved and you are doing well The problem is that this will  almost certainly happen again in the future  We have discussed this in detail  with you and your fianc you have decided to proceed with laparoscopic cholecystectomy with cholangiogram Dr. Derrell Lolling has discussed the details, techniques, and risks of this surgery in detail Please read over the pamphlet that we gave you  We will schedule the surgery in the near future     [facility]. Derrell Lolling, M.D., [facility] General and Minimally invasive Surgery Breast and Colorectal Surgery Office:   [phone] Pager:   [phone]

## 2018-01-08 ENCOUNTER — Encounter (HOSPITAL_BASED_OUTPATIENT_CLINIC_OR_DEPARTMENT_OTHER): Payer: Self-pay | Admitting: *Deleted

## 2018-01-08 ENCOUNTER — Other Ambulatory Visit: Payer: Self-pay

## 2018-01-09 ENCOUNTER — Ambulatory Visit (HOSPITAL_BASED_OUTPATIENT_CLINIC_OR_DEPARTMENT_OTHER): Payer: Medicaid Other | Admitting: Anesthesiology

## 2018-01-09 ENCOUNTER — Encounter (HOSPITAL_BASED_OUTPATIENT_CLINIC_OR_DEPARTMENT_OTHER)
Admission: RE | Disposition: A | Discharge status: Home / Self Care | Payer: Self-pay | Source: Ambulatory Visit | Attending: General Surgery

## 2018-01-09 ENCOUNTER — Encounter (HOSPITAL_BASED_OUTPATIENT_CLINIC_OR_DEPARTMENT_OTHER): Payer: Self-pay | Admitting: *Deleted

## 2018-01-09 ENCOUNTER — Ambulatory Visit (HOSPITAL_BASED_OUTPATIENT_CLINIC_OR_DEPARTMENT_OTHER)
Admission: RE | Admit: 2018-01-09 | Discharge: 2018-01-09 | Disposition: A | Discharge status: Home / Self Care | Payer: Medicaid Other | Source: Ambulatory Visit | Attending: General Surgery | Admitting: General Surgery

## 2018-01-09 ENCOUNTER — Ambulatory Visit (HOSPITAL_COMMUNITY): Payer: Medicaid Other

## 2018-01-09 DIAGNOSIS — Z8249 Family history of ischemic heart disease and other diseases of the circulatory system: Secondary | ICD-10-CM | POA: Diagnosis not present

## 2018-01-09 DIAGNOSIS — Z419 Encounter for procedure for purposes other than remedying health state, unspecified: Secondary | ICD-10-CM

## 2018-01-09 DIAGNOSIS — Z79899 Other long term (current) drug therapy: Secondary | ICD-10-CM | POA: Insufficient documentation

## 2018-01-09 DIAGNOSIS — K811 Chronic cholecystitis: Secondary | ICD-10-CM | POA: Insufficient documentation

## 2018-01-09 DIAGNOSIS — Z8379 Family history of other diseases of the digestive system: Secondary | ICD-10-CM | POA: Insufficient documentation

## 2018-01-09 DIAGNOSIS — Z8042 Family history of malignant neoplasm of prostate: Secondary | ICD-10-CM | POA: Diagnosis not present

## 2018-01-09 DIAGNOSIS — Z833 Family history of diabetes mellitus: Secondary | ICD-10-CM | POA: Insufficient documentation

## 2018-01-09 DIAGNOSIS — K219 Gastro-esophageal reflux disease without esophagitis: Secondary | ICD-10-CM | POA: Diagnosis not present

## 2018-01-09 DIAGNOSIS — K851 Biliary acute pancreatitis without necrosis or infection: Secondary | ICD-10-CM | POA: Diagnosis present

## 2018-01-09 DIAGNOSIS — Z800 Family history of malignant neoplasm of digestive organs: Secondary | ICD-10-CM | POA: Diagnosis not present

## 2018-01-09 HISTORY — PX: CHOLECYSTECTOMY: SHX55

## 2018-01-09 LAB — CBC WITH DIFFERENTIAL/PLATELET
Basophils Absolute: 0 10*3/uL (ref 0.0–0.1)
Basophils Relative: 0 %
EOS PCT: 1 %
Eosinophils Absolute: 0 10*3/uL (ref 0.0–0.7)
HCT: 39.6 % (ref 36.0–46.0)
HEMOGLOBIN: 13.4 g/dL (ref 12.0–15.0)
LYMPHS ABS: 1.7 10*3/uL (ref 0.7–4.0)
LYMPHS PCT: 41 %
MCH: 30.6 pg (ref 26.0–34.0)
MCHC: 33.8 g/dL (ref 30.0–36.0)
MCV: 90.4 fL (ref 78.0–100.0)
Monocytes Absolute: 0.3 10*3/uL (ref 0.1–1.0)
Monocytes Relative: 8 %
NEUTROS PCT: 50 %
Neutro Abs: 2.1 10*3/uL (ref 1.7–7.7)
Platelets: 269 10*3/uL (ref 150–400)
RBC: 4.38 MIL/uL (ref 3.87–5.11)
RDW: 12.5 % (ref 11.5–15.5)
WBC: 4.1 10*3/uL (ref 4.0–10.5)

## 2018-01-09 LAB — COMPREHENSIVE METABOLIC PANEL
ALK PHOS: 79 U/L (ref 38–126)
ALT: 23 U/L (ref 14–54)
AST: 18 U/L (ref 15–41)
Albumin: 4.5 g/dL (ref 3.5–5.0)
Anion gap: 9 (ref 5–15)
BUN: 11 mg/dL (ref 6–20)
CALCIUM: 9.8 mg/dL (ref 8.9–10.3)
CO2: 23 mmol/L (ref 22–32)
CREATININE: 0.65 mg/dL (ref 0.44–1.00)
Chloride: 105 mmol/L (ref 101–111)
Glucose, Bld: 93 mg/dL (ref 65–99)
Potassium: 3.9 mmol/L (ref 3.5–5.1)
Sodium: 137 mmol/L (ref 135–145)
Total Bilirubin: 0.6 mg/dL (ref 0.3–1.2)
Total Protein: 8.1 g/dL (ref 6.5–8.1)

## 2018-01-09 LAB — LIPASE, BLOOD: LIPASE: 31 U/L (ref 11–51)

## 2018-01-09 LAB — POCT PREGNANCY, URINE: Preg Test, Ur: NEGATIVE

## 2018-01-09 SURGERY — LAPAROSCOPIC CHOLECYSTECTOMY WITH INTRAOPERATIVE CHOLANGIOGRAM
Anesthesia: General | Site: Abdomen

## 2018-01-09 MED ORDER — ONDANSETRON HCL 4 MG/2ML IJ SOLN
INTRAMUSCULAR | Status: DC | PRN
  Administered 2018-01-09: 4 mg via INTRAVENOUS

## 2018-01-09 MED ORDER — EPHEDRINE SULFATE 50 MG/ML IJ SOLN
INTRAMUSCULAR | Status: DC | PRN
  Administered 2018-01-09: 10 mg via INTRAVENOUS

## 2018-01-09 MED ORDER — FENTANYL CITRATE (PF) 100 MCG/2ML IJ SOLN
INTRAMUSCULAR | Status: AC
  Filled 2018-01-09: qty 2

## 2018-01-09 MED ORDER — SCOPOLAMINE 1 MG/3DAYS TD PT72
1.0000 | MEDICATED_PATCH | Freq: Once | TRANSDERMAL | Status: DC | PRN
  Administered 2018-01-09: 1.5 mg via TRANSDERMAL

## 2018-01-09 MED ORDER — ONDANSETRON HCL 4 MG/2ML IJ SOLN
INTRAMUSCULAR | Status: AC
  Filled 2018-01-09: qty 2

## 2018-01-09 MED ORDER — LIDOCAINE HCL (CARDIAC) 20 MG/ML IV SOLN
INTRAVENOUS | Status: DC | PRN
  Administered 2018-01-09: 80 mg via INTRAVENOUS

## 2018-01-09 MED ORDER — SCOPOLAMINE 1 MG/3DAYS TD PT72
MEDICATED_PATCH | TRANSDERMAL | Status: AC
  Filled 2018-01-09: qty 1

## 2018-01-09 MED ORDER — PROPOFOL 10 MG/ML IV BOLUS
INTRAVENOUS | Status: DC | PRN
  Administered 2018-01-09: 180 mg via INTRAVENOUS

## 2018-01-09 MED ORDER — MIDAZOLAM HCL 2 MG/2ML IJ SOLN
1.0000 mg | INTRAMUSCULAR | Status: DC | PRN
  Administered 2018-01-09: 2 mg via INTRAVENOUS

## 2018-01-09 MED ORDER — FENTANYL CITRATE (PF) 100 MCG/2ML IJ SOLN: 25.0000 ug | INTRAMUSCULAR | Status: DC | PRN

## 2018-01-09 MED ORDER — HYDROCODONE-ACETAMINOPHEN 5-325 MG PO TABS: 1.0000 | ORAL_TABLET | Freq: Four times a day (QID) | ORAL | 0 refills | Status: DC | PRN

## 2018-01-09 MED ORDER — LACTATED RINGERS IV SOLN
INTRAVENOUS | Status: DC
  Administered 2018-01-09 (×2): via INTRAVENOUS

## 2018-01-09 MED ORDER — CHLORHEXIDINE GLUCONATE CLOTH 2 % EX PADS: 6.0000 | MEDICATED_PAD | Freq: Once | CUTANEOUS | Status: DC

## 2018-01-09 MED ORDER — FENTANYL CITRATE (PF) 100 MCG/2ML IJ SOLN
50.0000 ug | INTRAMUSCULAR | Status: DC | PRN
  Administered 2018-01-09: 100 ug via INTRAVENOUS

## 2018-01-09 MED ORDER — OXYCODONE HCL 5 MG PO TABS: 5.0000 mg | ORAL_TABLET | Freq: Once | ORAL | Status: DC | PRN

## 2018-01-09 MED ORDER — BUPIVACAINE-EPINEPHRINE 0.5% -1:200000 IJ SOLN
INTRAMUSCULAR | Status: DC | PRN
  Administered 2018-01-09: 20 mL

## 2018-01-09 MED ORDER — ACETAMINOPHEN 500 MG PO TABS: 1000.0000 mg | ORAL_TABLET | ORAL | Status: DC

## 2018-01-09 MED ORDER — SUGAMMADEX SODIUM 200 MG/2ML IV SOLN
INTRAVENOUS | Status: DC | PRN
  Administered 2018-01-09: 200 mg via INTRAVENOUS

## 2018-01-09 MED ORDER — SUGAMMADEX SODIUM 200 MG/2ML IV SOLN
INTRAVENOUS | Status: AC
  Filled 2018-01-09: qty 2

## 2018-01-09 MED ORDER — GABAPENTIN 300 MG PO CAPS: 300.0000 mg | ORAL_CAPSULE | ORAL | Status: DC

## 2018-01-09 MED ORDER — CELECOXIB 200 MG PO CAPS
200.0000 mg | ORAL_CAPSULE | Freq: Once | ORAL | Status: AC
  Administered 2018-01-09: 200 mg via ORAL

## 2018-01-09 MED ORDER — ACETAMINOPHEN 500 MG PO TABS
ORAL_TABLET | ORAL | Status: AC
  Filled 2018-01-09: qty 2

## 2018-01-09 MED ORDER — SODIUM CHLORIDE 0.9 % IV SOLN
INTRAVENOUS | Status: DC | PRN
  Administered 2018-01-09: 20 mL

## 2018-01-09 MED ORDER — ACETAMINOPHEN 500 MG PO TABS
1000.0000 mg | ORAL_TABLET | Freq: Once | ORAL | Status: AC
  Administered 2018-01-09: 1000 mg via ORAL

## 2018-01-09 MED ORDER — CEFAZOLIN SODIUM-DEXTROSE 2-4 GM/100ML-% IV SOLN
INTRAVENOUS | Status: AC
  Filled 2018-01-09: qty 100

## 2018-01-09 MED ORDER — ROCURONIUM BROMIDE 10 MG/ML (PF) SYRINGE
PREFILLED_SYRINGE | INTRAVENOUS | Status: AC
  Filled 2018-01-09: qty 5

## 2018-01-09 MED ORDER — PROMETHAZINE HCL 25 MG/ML IJ SOLN
6.2500 mg | INTRAMUSCULAR | Status: DC | PRN
  Administered 2018-01-09: 12.5 mg via INTRAVENOUS

## 2018-01-09 MED ORDER — CELECOXIB 200 MG PO CAPS: 200.0000 mg | ORAL_CAPSULE | ORAL | Status: DC

## 2018-01-09 MED ORDER — CELECOXIB 200 MG PO CAPS
ORAL_CAPSULE | ORAL | Status: AC
  Filled 2018-01-09: qty 1

## 2018-01-09 MED ORDER — CEFAZOLIN SODIUM-DEXTROSE 2-4 GM/100ML-% IV SOLN
2.0000 g | INTRAVENOUS | Status: AC
  Administered 2018-01-09: 2 g via INTRAVENOUS

## 2018-01-09 MED ORDER — DEXAMETHASONE SODIUM PHOSPHATE 10 MG/ML IJ SOLN
INTRAMUSCULAR | Status: AC
  Filled 2018-01-09: qty 1

## 2018-01-09 MED ORDER — IOPAMIDOL (ISOVUE-300) INJECTION 61%
INTRAVENOUS | Status: AC
  Filled 2018-01-09: qty 50

## 2018-01-09 MED ORDER — OXYCODONE HCL 5 MG/5ML PO SOLN: 5.0000 mg | Freq: Once | ORAL | Status: DC | PRN

## 2018-01-09 MED ORDER — MIDAZOLAM HCL 2 MG/2ML IJ SOLN
INTRAMUSCULAR | Status: AC
  Filled 2018-01-09: qty 2

## 2018-01-09 MED ORDER — SODIUM CHLORIDE 0.9 % IJ SOLN
INTRAMUSCULAR | Status: AC
  Filled 2018-01-09: qty 10

## 2018-01-09 MED ORDER — SODIUM CHLORIDE 0.9 % IR SOLN
Status: DC | PRN
  Administered 2018-01-09: 1000 mL

## 2018-01-09 MED ORDER — GABAPENTIN 300 MG PO CAPS
300.0000 mg | ORAL_CAPSULE | Freq: Once | ORAL | Status: AC
  Administered 2018-01-09: 300 mg via ORAL

## 2018-01-09 MED ORDER — PROMETHAZINE HCL 25 MG/ML IJ SOLN
INTRAMUSCULAR | Status: AC
  Filled 2018-01-09: qty 1

## 2018-01-09 MED ORDER — DEXAMETHASONE SODIUM PHOSPHATE 4 MG/ML IJ SOLN
INTRAMUSCULAR | Status: DC | PRN
  Administered 2018-01-09: 10 mg via INTRAVENOUS

## 2018-01-09 MED ORDER — ROCURONIUM BROMIDE 100 MG/10ML IV SOLN
INTRAVENOUS | Status: DC | PRN
  Administered 2018-01-09: 50 mg via INTRAVENOUS

## 2018-01-09 MED ORDER — GABAPENTIN 300 MG PO CAPS
ORAL_CAPSULE | ORAL | Status: AC
  Filled 2018-01-09: qty 1

## 2018-01-09 SURGICAL SUPPLY — 47 items
APPLICATOR ARISTA FLEXITIP XL (MISCELLANEOUS) IMPLANT
APPLIER CLIP ROT 10 11.4 M/L (STAPLE) ×3
BLADE CLIPPER SURG (BLADE) IMPLANT
CANISTER SUCT 1200ML W/VALVE (MISCELLANEOUS) ×3 IMPLANT
CHLORAPREP W/TINT 26ML (MISCELLANEOUS) ×3 IMPLANT
CLIP APPLIE ROT 10 11.4 M/L (STAPLE) ×1 IMPLANT
CLOSURE WOUND 1/2 X4 (GAUZE/BANDAGES/DRESSINGS)
COVER MAYO STAND STRL (DRAPES) ×3 IMPLANT
DECANTER SPIKE VIAL GLASS SM (MISCELLANEOUS) ×3 IMPLANT
DERMABOND ADVANCED (GAUZE/BANDAGES/DRESSINGS) ×2
DERMABOND ADVANCED .7 DNX12 (GAUZE/BANDAGES/DRESSINGS) ×1 IMPLANT
DRAPE C-ARM 42X72 X-RAY (DRAPES) ×3 IMPLANT
DRAPE LAPAROSCOPIC ABDOMINAL (DRAPES) ×3 IMPLANT
ELECT REM PT RETURN 9FT ADLT (ELECTROSURGICAL) ×3
ELECTRODE REM PT RTRN 9FT ADLT (ELECTROSURGICAL) ×1 IMPLANT
FILTER SMOKE EVAC LAPAROSHD (FILTER) IMPLANT
GLOVE BIO SURGEON STRL SZ7 (GLOVE) ×3 IMPLANT
GLOVE BIOGEL PI IND STRL 7.0 (GLOVE) ×2 IMPLANT
GLOVE BIOGEL PI IND STRL 7.5 (GLOVE) ×1 IMPLANT
GLOVE BIOGEL PI INDICATOR 7.0 (GLOVE) ×4
GLOVE BIOGEL PI INDICATOR 7.5 (GLOVE) ×2
GLOVE ECLIPSE 6.5 STRL STRAW (GLOVE) ×3 IMPLANT
GLOVE EUDERMIC 7 POWDERFREE (GLOVE) ×3 IMPLANT
GLOVE SURG SYN 7.5  E (GLOVE) ×2
GLOVE SURG SYN 7.5 E (GLOVE) ×1 IMPLANT
GOWN STRL REUS W/ TWL LRG LVL3 (GOWN DISPOSABLE) ×1 IMPLANT
GOWN STRL REUS W/ TWL XL LVL3 (GOWN DISPOSABLE) ×3 IMPLANT
GOWN STRL REUS W/TWL LRG LVL3 (GOWN DISPOSABLE) ×2
GOWN STRL REUS W/TWL XL LVL3 (GOWN DISPOSABLE) ×6
HEMOSTAT SNOW SURGICEL 2X4 (HEMOSTASIS) IMPLANT
NS IRRIG 1000ML POUR BTL (IV SOLUTION) ×3 IMPLANT
PACK BASIN DAY SURGERY FS (CUSTOM PROCEDURE TRAY) ×3 IMPLANT
POUCH SPECIMEN RETRIEVAL 10MM (ENDOMECHANICALS) ×3 IMPLANT
SCISSORS LAP 5X35 DISP (ENDOMECHANICALS) ×3 IMPLANT
SET CHOLANGIOGRAPH 5 50 .035 (SET/KITS/TRAYS/PACK) ×3 IMPLANT
SET IRRIG TUBING LAPAROSCOPIC (IRRIGATION / IRRIGATOR) ×3 IMPLANT
SLEEVE ENDOPATH XCEL 5M (ENDOMECHANICALS) ×3 IMPLANT
SLEEVE SCD COMPRESS KNEE MED (MISCELLANEOUS) ×3 IMPLANT
STRIP CLOSURE SKIN 1/2X4 (GAUZE/BANDAGES/DRESSINGS) IMPLANT
SUT MNCRL AB 4-0 PS2 18 (SUTURE) ×3 IMPLANT
SUT VICRYL 0 UR6 27IN ABS (SUTURE) IMPLANT
TOWEL OR 17X24 6PK STRL BLUE (TOWEL DISPOSABLE) ×3 IMPLANT
TRAY LAPAROSCOPIC (CUSTOM PROCEDURE TRAY) ×3 IMPLANT
TROCAR XCEL BLUNT TIP 100MML (ENDOMECHANICALS) ×3 IMPLANT
TROCAR XCEL NON-BLD 11X100MML (ENDOMECHANICALS) ×3 IMPLANT
TROCAR XCEL NON-BLD 5MMX100MML (ENDOMECHANICALS) ×3 IMPLANT
TUBING INSUFFLATION (TUBING) ×3 IMPLANT

## 2018-01-09 NOTE — Anesthesia Procedure Notes (Signed)
Procedure Name: Intubation Date/Time: 01/09/2018 1:11 PM Performed by: Maryella Shivers, CRNA Pre-anesthesia Checklist: Patient identified, Emergency Drugs available, Suction available and Patient being monitored Patient Re-evaluated:Patient Re-evaluated prior to induction Oxygen Delivery Method: Circle system utilized Preoxygenation: Pre-oxygenation with 100% oxygen Induction Type: IV induction Ventilation: Mask ventilation without difficulty Laryngoscope Size: Mac and 3 Grade View: Grade I Tube type: Oral Tube size: 7.0 mm Number of attempts: 1 Airway Equipment and Method: Stylet and Oral airway Placement Confirmation: ETT inserted through vocal cords under direct vision,  positive ETCO2 and breath sounds checked- equal and bilateral Secured at: 19 cm Tube secured with: Tape Dental Injury: Teeth and Oropharynx as per pre-operative assessment

## 2018-01-09 NOTE — Op Note (Signed)
Patient Name:           Julie SanderShanteka [name]   Date of Surgery:        01/09/2018  Pre op Diagnosis:      Gallstone pancreatitis  Post op Diagnosis:    Same  Procedure:                 Laparoscopic cholecystectomy with intraoperative cholangiogram  Surgeon:                     Angelia MouldHaywood M. Derrell LollingIngram, M.D., FACS  Assistant:                      OR staff  Operative Indications:   This is a 22 year old female, 2 months postpartum, engaged to be married who presented to the emergency department with gallstone pancreatitis. She had been having abdominal pain off and on for a few weeks. She had a couple of episodes of nausea and vomiting. She had 1 episode of diarrhea. The pain got worse and she came to the emergency department where her lipase was greater than 10,000 and her AST and ALT were slightly elevated. Hemoglobin and WBC were normal. Labs were repeated a few hours later and the lipase had gone down to 200 and her pain had resolved. Ultrasound showed gallstones and slight gallbladder wall thickening, nonspecific. CT abdomen and pelvis suggestive acute cholecystitis with mild wall thickening. Pancreas did not look inflamed. MRI showed mild gallbladder wall thickening and gallstones. CBD borderline dilated to 7 mm but there was no common bile duct stones. She did not want to be admitted and went home and follow-up was arranged with me.  She has done well since she went home. She is avoiding fatty foods as instructed. She does not drink alcohol. She is bottle feeding her 752 month old. She is motivated for elective cholecystectomy      I went over the pathophysiology of her gallstone pancreatitis with her.  She will be scheduled for laparoscopic cholecystectomy with cholangiogram, possible open cholecystectomy.She agrees with this plan.    Operative Findings:       The gallbladder was chronically inflamed and discolored.  Slightly thickened wall.  There were some adhesions to the  gallbladder.  No evidence of acute inflammation.  No fat necrosis seen.  The anatomy of the cystic duct, cystic artery and common bile duct were conventional.  Intraoperative cholangiogram showed normal intrahepatic and extrahepatic biliary tree other than slight dilatation, but the contrast progressed through the common bile duct with a normal tapering of the intra-intrapancreatic portion and good flow contrast into the duodenum.  There was no filling defect.  Otherwise, the stomach, duodenum, small intestine, large intestine, liver, peritoneal surfaces all looked normal.  Procedure in Detail:          Following the induction of general endotracheal anesthesia the patient's abdomen was prepped and draped in sterile fashion.  Surgical timeout was performed.  Intravenous antibiotics were given.  0.5% Marcaine with epinephrine was used as a local infiltration anesthetic.      A vertical incision was made in the lower rim of the umbilicus and the fascia was incised in the midline and the abdominal cavity entered under direct vision.  Hassan cannula was inserted and secured with the pursestring suture of 0 Vicryl.  Pneumoperitoneum was created and video camera was inserted.  A trocar was placed in the subxiphoid region and two 5 mm trochars placed in the right  subcostal region.  The gallbladder was elevated.  Adhesions were taken down.  The infundibulum was retracted laterally.  The peritoneum was dissected off of the neck of the gallbladder.  We slowly dissected out the cystic artery and the cystic duct and created a large window and critical view behind both of these structures.  The cystic artery was controlled with multiple medical hips and divided.  A cholangiogram catheter was inserted into the cystic duct.  A cholangiogram was obtained using C arm.  The cholangiogram was normal as described above.  The catheter was removed and the cystic duct secured with multiple medical clips and divided.  The gallbladder  was dissected from its bed with electrocautery, placed in a specimen bag and removed.     The operative field was irrigated.  It was completely hemostatic.  There was no evidence of bile.  The irrigation fluid was removed.  The trochars were removed and the pneumoperitoneum released.  The fascia at the umbilicus was closed with 0 Vicryl sutures.  The skin incisions were closed with subcuticular sutures of 4-0 Monocryl and Dermabond.  The patient tolerated the procedure well and was taken to PACU in stable condition.  EBL 10 cc.  Counts correct.  Complications none.   Addendum: I logged on to the [facility] website and reviewed her prescription medication history     Treyten Monestime M. Derrell Lolling, M.D., FACS General and Minimally Invasive Surgery Breast and Colorectal Surgery  01/09/2018 2:35 PM

## 2018-01-09 NOTE — Interval H&P Note (Signed)
History and Physical Interval Note:  01/09/2018 12:57 PM  Julie SanderShanteka Cassar  has presented today for surgery, with the diagnosis of GALLSTONE PANCREATITIS  The various methods of treatment have been discussed with the patient and family. After consideration of risks, benefits and other options for treatment, the patient has consented to  Procedure(s): LAPAROSCOPIC CHOLECYSTECTOMY WITH INTRAOPERATIVE CHOLANGIOGRAM (N/A) as a surgical intervention .  The patient's history has been reviewed, patient examined, no change in status, stable for surgery.  I have reviewed the patient's chart and labs.  Questions were answered to the patient's satisfaction.     Ernestene MentionHaywood M Elvena Oyer

## 2018-01-09 NOTE — Anesthesia Postprocedure Evaluation (Signed)
Anesthesia Post Note  Patient: Julie [name]  Procedure(s) Performed: LAPAROSCOPIC CHOLECYSTECTOMY WITH INTRAOPERATIVE CHOLANGIOGRAM (N/A Abdomen)     Patient location during evaluation: PACU Anesthesia Type: General Level of consciousness: awake and alert Pain management: pain level controlled Vital Signs Assessment: post-procedure vital signs reviewed and stable Respiratory status: spontaneous breathing, nonlabored ventilation, respiratory function stable and patient connected to nasal cannula oxygen Cardiovascular status: blood pressure returned to baseline and stable Postop Assessment: no apparent nausea or vomiting Anesthetic complications: no    Last Vitals:  Vitals:   01/09/18 1515 01/09/18 1530  BP: 122/74 121/74  Pulse: (!) 103 (!) 102  Resp: (!) 25 (!) 28  Temp:    SpO2: 100% 99%    Last Pain:  Vitals:   01/09/18 1524  TempSrc:   PainSc: 0-No pain                 Beryle Lathehomas E Brock

## 2018-01-09 NOTE — Discharge Instructions (Signed)
[facility] ______[facility] LAPAROSCOPIC SURGERY: POST OP INSTRUCTIONS  Tylenol 1000mg  received at 1230 PM, next dose after 630 PM if needed!! May begin narcotic pain medicine after 630 PM if needed!!  Always review your discharge instruction sheet given to you by the facility where your surgery was performed. IF YOU HAVE DISABILITY OR FAMILY LEAVE FORMS, YOU MUST BRING THEM TO THE OFFICE FOR PROCESSING.   DO NOT GIVE THEM TO YOUR DOCTOR.  1. A prescription for pain medication may be given to you upon discharge.  Take your pain medication as prescribed, if needed.  If narcotic pain medicine is not needed, then you may take acetaminophen (Tylenol) or ibuprofen (Advil) as needed. 2. Take your usually prescribed medications unless otherwise directed. 3. If you need a refill on your pain medication, please contact your pharmacy.  They will contact our office to request authorization. Prescriptions will not be filled after 5pm or on week-ends. 4. You should follow a light diet the first few days after arrival home, such as soup and crackers, etc.  Be sure to include lots of fluids daily. 5. Most patients will experience some swelling and bruising in the area of the incisions.  Ice packs will help.  Swelling and bruising can take several days to resolve.  6. It is common to experience some constipation if taking pain medication after surgery.  Increasing fluid intake and taking a stool softener (such as Colace) will usually help or prevent this problem from occurring.  A mild laxative (Milk of Magnesia or Miralax) should be taken according to package instructions if there are no bowel movements after 48 hours. 7. Unless discharge instructions indicate otherwise, you may remove your bandages 24-48 hours after surgery, and you may shower at that time.  You may have steri-strips (small skin tapes) in place directly over the incision.  These strips should be left on the skin for 7-10 days.  If  your surgeon used skin glue on the incision, you may shower in 24 hours.  The glue will flake off over the next 2-3 weeks.  Any sutures or staples will be removed at the office during your follow-up visit. 8. ACTIVITIES:  You may resume regular (light) daily activities beginning the next day--such as daily self-care, walking, climbing stairs--gradually increasing activities as tolerated.  You may have sexual intercourse when it is comfortable.  Refrain from any heavy lifting or straining until approved by your doctor. a. You may drive when you are no longer taking prescription pain medication, you can comfortably wear a seatbelt, and you can safely maneuver your car and apply brakes. b. RETURN TO WORK:  __________________________________________________________ 9. You should see your doctor in the office for a follow-up appointment approximately 2-3 weeks after your surgery.  Make sure that you call for this appointment within a day or two after you arrive home to insure a convenient appointment time. 10. OTHER INSTRUCTIONS: 11. Avoid fatty foods 12. No lifting more than 15 pounds for 3 weeks 13. Okay to shower __________________________________________________________________________________________________________________________ __________________________________________________________________________________________________________________________ WHEN TO CALL YOUR DOCTOR: 1. Fever over 101.0 2. Inability to urinate 3. Continued bleeding from incision. 4. Increased pain, redness, or drainage from the incision. 5. Increasing abdominal pain  The clinic staff is available to answer your questions during regular business hours.  Please dont hesitate to call and ask to speak to one of the nurses for clinical concerns.  If you have a medical emergency, go to the nearest emergency room or call 911.  A surgeon from [facility] is always on call at the hospital. [address],  [address], [place], [place]  [address] ? [address] [address], [place], [place]   [address] [phone] ? [phone] ? FAX [phone] Web site: [email or web address]    Post Anesthesia Home Care Instructions  Activity: Get plenty of rest for the remainder of the day. A responsible individual must stay with you for 24 hours following the procedure.  For the next 24 hours, DO NOT: -Drive a car -Advertising copywriter -Drink alcoholic beverages -Take any medication unless instructed by your physician -Make any legal decisions or sign important papers.  Meals: Start with liquid foods such as gelatin or soup. Progress to regular foods as tolerated. Avoid greasy, spicy, heavy foods. If nausea and/or vomiting occur, drink only clear liquids until the nausea and/or vomiting subsides. Call your physician if vomiting continues.  Special Instructions/Symptoms: Your throat may feel dry or sore from the anesthesia or the breathing tube placed in your throat during surgery. If this causes discomfort, gargle with warm salt water. The discomfort should disappear within 24 hours.  If you had a scopolamine patch placed behind your ear for the management of post- operative nausea and/or vomiting:  1. The medication in the patch is effective for 72 hours, after which it should be removed.  Wrap patch in a tissue and discard in the trash. Wash hands thoroughly with soap and water. 2. You may remove the patch earlier than 72 hours if you experience unpleasant side effects which may include dry mouth, dizziness or visual disturbances. 3. Avoid touching the patch. Wash your hands with soap and water after contact with the patch.   Call your surgeon if you experience:   1.  Fever over 101.0. 2.  Inability to urinate. 3.  Nausea and/or vomiting. 4.  Extreme swelling or bruising at the surgical site. 5.  Continued bleeding from the incision. 6.  Increased pain, redness or drainage from the incision. 7.   Problems related to your pain medication. 8.  Any problems and/or concerns

## 2018-01-09 NOTE — Anesthesia Preprocedure Evaluation (Addendum)
Anesthesia Evaluation  Patient identified by MRN, date of birth, ID band Patient awake    Reviewed: Allergy & Precautions, NPO status , Patient's Chart, lab work & pertinent test results  Airway Mallampati: II  TM Distance: >3 FB Neck ROM: Full    Dental no notable dental hx. (+) Dental Advisory Given, Chipped   Pulmonary neg pulmonary ROS,    Pulmonary exam normal breath sounds clear to auscultation       Cardiovascular negative cardio ROS Normal cardiovascular exam Rhythm:Regular Rate:Normal     Neuro/Psych negative neurological ROS  negative psych ROS   GI/Hepatic Neg liver ROS, GERD  Controlled and Medicated,  Endo/Other  negative endocrine ROS  Renal/GU negative Renal ROS  negative genitourinary   Musculoskeletal negative musculoskeletal ROS (+)   Abdominal   Peds negative pediatric ROS (+)  Hematology negative hematology ROS (+)   Anesthesia Other Findings   Reproductive/Obstetrics                            Anesthesia Physical  Anesthesia Plan  ASA: II  Anesthesia Plan: General   Post-op Pain Management:    Induction: Intravenous  PONV Risk Score and Plan: 4 or greater and Treatment may vary due to age or medical condition, Ondansetron, Dexamethasone, Midazolam and Scopolamine patch - Pre-op  Airway Management Planned: Oral ETT  Additional Equipment: None  Intra-op Plan:   Post-operative Plan: Extubation in OR  Informed Consent: I have reviewed the patients History and Physical, chart, labs and discussed the procedure including the risks, benefits and alternatives for the proposed anesthesia with the patient or authorized representative who has indicated his/her understanding and acceptance.   Dental advisory given  Plan Discussed with: CRNA and Anesthesiologist  Anesthesia Plan Comments:         Anesthesia Quick Evaluation

## 2018-01-09 NOTE — Transfer of Care (Signed)
Immediate Anesthesia Transfer of Care Note  Patient: Guido SanderShanteka Ruben  Procedure(s) Performed: LAPAROSCOPIC CHOLECYSTECTOMY WITH INTRAOPERATIVE CHOLANGIOGRAM (N/A Abdomen)  Patient Location: PACU  Anesthesia Type:General  Level of Consciousness: sedated  Airway & Oxygen Therapy: Patient Spontanous Breathing and Patient connected to face mask oxygen  Post-op Assessment: Report given to RN and Post -op Vital signs reviewed and stable  Post vital signs: Reviewed and stable  Last Vitals:  Vitals Value Taken Time  BP    Temp    Pulse 104 01/09/2018  2:37 PM  Resp 30 01/09/2018  2:37 PM  SpO2 100 % 01/09/2018  2:37 PM    Last Pain:  Vitals:   01/09/18 1221  TempSrc: Oral  PainSc: 0-No pain         Complications: No apparent anesthesia complications

## 2018-01-10 ENCOUNTER — Encounter (HOSPITAL_BASED_OUTPATIENT_CLINIC_OR_DEPARTMENT_OTHER): Payer: Self-pay | Admitting: General Surgery

## 2018-02-01 ENCOUNTER — Other Ambulatory Visit: Payer: Self-pay

## 2018-02-01 ENCOUNTER — Ambulatory Visit (HOSPITAL_COMMUNITY)
Admission: EM | Admit: 2018-02-01 | Discharge: 2018-02-01 | Disposition: A | Discharge status: Home / Self Care | Payer: Medicaid Other | Attending: Family Medicine | Admitting: Family Medicine

## 2018-02-01 ENCOUNTER — Encounter (HOSPITAL_COMMUNITY): Payer: Self-pay | Admitting: Emergency Medicine

## 2018-02-01 DIAGNOSIS — N939 Abnormal uterine and vaginal bleeding, unspecified: Secondary | ICD-10-CM | POA: Diagnosis not present

## 2018-02-01 LAB — POCT I-STAT, CHEM 8
BUN: 7 mg/dL (ref 6–20)
CALCIUM ION: 1.23 mmol/L (ref 1.15–1.40)
CREATININE: 0.6 mg/dL (ref 0.44–1.00)
Chloride: 103 mmol/L (ref 101–111)
Glucose, Bld: 83 mg/dL (ref 65–99)
HCT: 40 % (ref 36.0–46.0)
Hemoglobin: 13.6 g/dL (ref 12.0–15.0)
Potassium: 3.9 mmol/L (ref 3.5–5.1)
SODIUM: 140 mmol/L (ref 135–145)
TCO2: 26 mmol/L (ref 22–32)

## 2018-02-01 MED ORDER — MEGESTROL ACETATE 40 MG PO TABS: 40.0000 mg | ORAL_TABLET | Freq: Every day | ORAL | 0 refills | Status: AC

## 2018-02-01 NOTE — ED Triage Notes (Signed)
Pt here for abnormal vaginal bleeding since she had her baby in January.  Pt is getting Depo shots for [facility] that she started in January after the birth of her child.  She is due for her next one in a week.  She also reports some mild cramping.

## 2018-02-05 ENCOUNTER — Ambulatory Visit: Payer: Medicaid Other

## 2018-02-05 NOTE — ED Provider Notes (Signed)
[facility]   [id number] 02/01/18 Arrival Time: 1409  ASSESSMENT & PLAN:  1. Abnormal uterine bleeding     Meds ordered this encounter  Medications  . megestrol (MEGACE) 40 MG tablet    Sig: Take 1 tablet (40 mg total) by mouth daily for 14 days.    Dispense:  14 tablet    Refill:  0   She plans to schedule Gyn f/u, esp if this does not improve. ED if abrupt worsening.  Reviewed expectations re: course of current medical issues. Questions answered. Outlined signs and symptoms indicating need for more acute intervention. Patient verbalized understanding. After Visit Summary given.   SUBJECTIVE:  Julie [name] is a 22 y.o. female who presents with complaint of intermittent vaginal bleeding since giving birth in Jan 2019. Periods previously regular. Also started on Depo after delivery. Bleeding may last a few days where she uses 2-3 pads per day. No abdominal discomfort or pain reported. No specific aggravating or alleviating factors reported. She denies chills, dysuria, fever, nausea and vomiting. Appetite: normal. PO intake: normal. Ambulatory without assistance. Urinary symptoms: none. OTC treatment: none.  No LMP recorded. Patient has had an injection.   Past Surgical History:  Procedure Laterality Date  . CHOLECYSTECTOMY N/A 01/09/2018   Procedure: LAPAROSCOPIC CHOLECYSTECTOMY WITH INTRAOPERATIVE CHOLANGIOGRAM;  Surgeon: Claud Kelp, MD;  Location: [facility];  Service: General;  Laterality: N/A;  . NO PAST SURGERIES      ROS: As per HPI.  OBJECTIVE:  Vitals:   02/01/18 1443  BP: 105/70  Pulse: 97  Temp: 98.6 F (37 C)  TempSrc: Oral  SpO2: 100%    General appearance: alert; no distress Lungs: clear to auscultation bilaterally Heart: regular rate and rhythm Abdomen: soft; non-distended; no tenderness; bowel sounds present; no masses or organomegaly; no guarding or rebound tenderness GU: declined Back: no CVA tenderness; FROM at  hips Extremities: no edema; symmetrical with no gross deformities Skin: warm and dry Psychological: alert and cooperative; normal mood and affect  Labs: Results for orders placed or performed during the hospital encounter of 02/01/18  I-STAT, chem 8  Result Value Ref Range   Sodium 140 135 - 145 mmol/L   Potassium 3.9 3.5 - 5.1 mmol/L   Chloride 103 101 - 111 mmol/L   BUN 7 6 - 20 mg/dL   Creatinine, Ser 4.09 0.44 - 1.00 mg/dL   Glucose, Bld 83 65 - 99 mg/dL   Calcium, Ion 8.11 9.14 - 1.40 mmol/L   TCO2 26 22 - 32 mmol/L   Hemoglobin 13.6 12.0 - 15.0 g/dL   HCT 78.2 95.6 - 21.3 %   Labs Reviewed  POCT I-STAT, CHEM 8    No Known Allergies                                             Past Medical History:  Diagnosis Date  . GERD (gastroesophageal reflux disease)    Social History   Socioeconomic History  . Marital status: Single    Spouse name: Not on file  . Number of children: Not on file  . Years of education: Not on file  . Highest education level: Not on file  Occupational History  . Not on file  Social Needs  . Financial resource strain: Not on file  . Food insecurity:    Worry: Not on file  Inability: Not on file  . Transportation needs:    Medical: Not on file    Non-medical: Not on file  Tobacco Use  . Smoking status: Never Smoker  . Smokeless tobacco: Never Used  Substance and Sexual Activity  . Alcohol use: No  . Drug use: No  . Sexual activity: Yes    Comment: pt is 3 wks postpartum  Lifestyle  . Physical activity:    Days per week: Not on file    Minutes per session: Not on file  . Stress: Not on file  Relationships  . Social connections:    Talks on phone: Not on file    Gets together: Not on file    Attends religious service: Not on file    Active member of club or organization: Not on file    Attends meetings of clubs or organizations: Not on file    Relationship status: Not on file  . Intimate partner violence:    Fear of current  or ex partner: Not on file    Emotionally abused: Not on file    Physically abused: Not on file    Forced sexual activity: Not on file  Other Topics Concern  . Not on file  Social History Narrative  . Not on file   Family History  Problem Relation Age of Onset  . Diabetes Mother   . Pancreatic cancer Father   . Cancer Maternal Glynda Jaeger, MD 02/06/18 [phone]

## 2018-02-08 ENCOUNTER — Telehealth: Payer: Self-pay | Admitting: *Deleted

## 2018-02-08 NOTE — Telephone Encounter (Signed)
Lft vmail for patient to callback and reschedule missed DEPO appt. or let us know her plans.Marland KitchenMarland Kitchen

## 2018-02-12 ENCOUNTER — Telehealth: Payer: Self-pay | Admitting: Licensed Clinical Social Worker

## 2018-02-12 NOTE — Telephone Encounter (Signed)
CSW Julie [name] call pt regarding missed appt. Left detailed message to reschedule.

## 2018-02-16 ENCOUNTER — Other Ambulatory Visit: Payer: Self-pay

## 2018-02-16 ENCOUNTER — Encounter (HOSPITAL_COMMUNITY): Payer: Self-pay

## 2018-02-16 ENCOUNTER — Ambulatory Visit (HOSPITAL_COMMUNITY)
Admission: EM | Admit: 2018-02-16 | Discharge: 2018-02-16 | Disposition: A | Discharge status: Home / Self Care | Payer: Medicaid Other | Attending: Family Medicine | Admitting: Family Medicine

## 2018-02-16 DIAGNOSIS — R110 Nausea: Secondary | ICD-10-CM

## 2018-02-16 LAB — POCT PREGNANCY, URINE: Preg Test, Ur: NEGATIVE

## 2018-02-16 MED ORDER — VITAMIN B-6 25 MG PO TABS: 25.0000 mg | ORAL_TABLET | Freq: Two times a day (BID) | ORAL | 0 refills | Status: DC | PRN

## 2018-02-16 NOTE — Discharge Instructions (Addendum)
UPT negative Pyridoxine prescribed Follow up with OB/GYN next week and consider depo shot Return or go to ER if you have any new or worsening symptoms

## 2018-02-16 NOTE — ED Provider Notes (Signed)
[facility]   [id number] 02/16/18 Arrival Time: 1821  SUBJECTIVE:  Julie [name] is a 22 y.o. female who presents with complaint of stable nausea that began 1 week ago.  Denies a precipitating event, or eating anything out of the ordinary.  No one at home with similar symptoms.  Does depo shots for birth control, but missed last appointment on 02/07/18.  Unprotected sexual intercourse 3-4 days ago and again yesterday.  Describes nausea as intermittent.  Has tried acid reflux medication with temporary relief.  Denies aggravating factors.  Reports similar symptoms in the past that resolved with acid reflux medications.  Last BM today.   Requests UPT.   Denies fever, chills, appetite changes, weight changes, vomiting, breast tenderness,chest pain, SOB, diarrhea, constipation, dysuria, difficulty urinating, increased frequency or urgency, flank pain, loss of bowel or bladder function.  No LMP recorded (lmp unknown).  ROS: As per HPI.  Past Medical History:  Diagnosis Date  . GERD (gastroesophageal reflux disease)    Past Surgical History:  Procedure Laterality Date  . CHOLECYSTECTOMY N/A 01/09/2018   Procedure: LAPAROSCOPIC CHOLECYSTECTOMY WITH INTRAOPERATIVE CHOLANGIOGRAM;  Surgeon: Claud Kelp, MD;  Location: [facility];  Service: General;  Laterality: N/A;  . NO PAST SURGERIES     No Known Allergies No current facility-administered medications on file prior to encounter.    Current Outpatient Medications on File Prior to Encounter  Medication Sig Dispense Refill  . omeprazole (PRILOSEC) 20 MG capsule Take 1 capsule (20 mg total) by mouth 2 (two) times daily before a meal. 60 capsule 1  . acetaminophen (TYLENOL) 500 MG tablet Take 1,000 mg by mouth every 6 (six) hours as needed for moderate pain.    Marland Kitchen HYDROcodone-acetaminophen (NORCO) 5-325 MG tablet Take 1-2 tablets by mouth every 6 (six) hours as needed for moderate pain or severe pain. 20 tablet 0    Social History   Socioeconomic History  . Marital status: Single    Spouse name: Not on file  . Number of children: Not on file  . Years of education: Not on file  . Highest education level: Not on file  Occupational History  . Not on file  Social Needs  . Financial resource strain: Not on file  . Food insecurity:    Worry: Not on file    Inability: Not on file  . Transportation needs:    Medical: Not on file    Non-medical: Not on file  Tobacco Use  . Smoking status: Never Smoker  . Smokeless tobacco: Never Used  Substance and Sexual Activity  . Alcohol use: No  . Drug use: No  . Sexual activity: Yes    Comment: pt is 3 wks postpartum  Lifestyle  . Physical activity:    Days per week: Not on file    Minutes per session: Not on file  . Stress: Not on file  Relationships  . Social connections:    Talks on phone: Not on file    Gets together: Not on file    Attends religious service: Not on file    Active member of club or organization: Not on file    Attends meetings of clubs or organizations: Not on file    Relationship status: Not on file  . Intimate partner violence:    Fear of current or ex partner: Not on file    Emotionally abused: Not on file    Physically abused: Not on file    Forced sexual activity: Not  on file  Other Topics Concern  . Not on file  Social History Narrative  . Not on file   Family History  Problem Relation Age of Onset  . Diabetes Mother   . Pancreatic cancer Father   . Cancer Maternal Grandfather      OBJECTIVE:  Vitals:   02/16/18 1903  BP: 117/79  Pulse: 90  Resp: 16  Temp: 98.3 F (36.8 C)  TempSrc: Oral  SpO2: 99%    General appearance: AOx3 in no acute distress HEENT: NCAT.  Oropharynx clear.  Lungs: clear to auscultation bilaterally without adventitious breath sounds Heart: regular rate and rhythm.  Radial pulses 2+ symmetrical bilaterally Abdomen: soft, non-distended; normal active bowel sounds; non-tender  to light and deep palpation; no guarding Back: no CVA tenderness Extremities: no edema; symmetrical with no gross deformities Skin: warm and dry Neurologic: normal gait Psychological: alert and cooperative; normal mood and affect  Labs: Results for orders placed or performed during the hospital encounter of 02/16/18  Pregnancy, urine POC  Result Value Ref Range   Preg Test, Ur NEGATIVE NEGATIVE   Labs Reviewed  POCT PREGNANCY, URINE    Imaging: No results found.   ASSESSMENT & PLAN:  1. Nausea without vomiting     Meds ordered this encounter  Medications  . vitamin B-6 (PYRIDOXINE) 25 MG tablet    Sig: Take 1 tablet (25 mg total) by mouth 2 (two) times daily as needed (nausea).    Dispense:  20 tablet    Refill:  0    Order Specific Question:   Supervising Provider    Answer:   Isa Rankin [[id number]]    UPT negative Pyridoxine prescribed Follow up with OB/GYN next week and consider depo shot Return or go to ER if you have any new or worsening symptoms  Reviewed expectations re: course of current medical issues. Questions answered. Outlined signs and symptoms indicating need for more acute intervention. Patient verbalized understanding. After Visit Summary given.   Rennis Harding, PA-C 02/16/18 2010

## 2018-02-16 NOTE — ED Triage Notes (Signed)
Pt presents with complaints of abdominal pain and dizziness for a week. Reports missing her birth control shot. Requesting pregnancy test. Pt has four month old and verbalized feeling stressed out. Emotional support given to patient.

## 2018-10-03 NOTE — L&D Delivery Note (Addendum)
Oceane Fosse is a 23 y.o. female G2P1001 with IUP at [redacted]w[redacted]d admitted for spontaneous rupture of membranes and labor.  She progressed without augmentation to complete and pushed 2x to deliver.  Cord clamping delayed by several minutes then clamped by CNM and cut by mother of patient.    Delivery Note At 10:21 PM a viable female was delivered via Vaginal, Spontaneous (Presentation: LOA). Head delivered en caul and spontaneously ruptured with delivery of the shoulders.  APGAR: 9, 9; weight pending.   Placenta status: spontaneous, intact.  Cord: 3 vessels  Anesthesia:  epidural Episiotomy:  n/a Lacerations: 1st degree;Perineal Suture Repair: 3.0 vicryl Est. Blood Loss (mL):  300  Mom to postpartum.  Baby to Couplet care / Skin to Skin.  Wende Mott CNM 04/21/2019, 10:36 PM

## 2019-04-02 ENCOUNTER — Other Ambulatory Visit: Payer: Self-pay

## 2019-04-02 ENCOUNTER — Encounter (HOSPITAL_COMMUNITY): Payer: Self-pay | Admitting: Emergency Medicine

## 2019-04-02 ENCOUNTER — Emergency Department (HOSPITAL_COMMUNITY)
Admission: EM | Admit: 2019-04-02 | Discharge: 2019-04-02 | Disposition: A | Discharge status: Home / Self Care | Payer: Medicaid Other | Attending: Emergency Medicine | Admitting: Emergency Medicine

## 2019-04-02 DIAGNOSIS — O368130 Decreased fetal movements, third trimester, not applicable or unspecified: Secondary | ICD-10-CM | POA: Insufficient documentation

## 2019-04-02 DIAGNOSIS — Z113 Encounter for screening for infections with a predominantly sexual mode of transmission: Secondary | ICD-10-CM | POA: Diagnosis not present

## 2019-04-02 DIAGNOSIS — Z3A37 37 weeks gestation of pregnancy: Secondary | ICD-10-CM | POA: Diagnosis not present

## 2019-04-02 LAB — DIFFERENTIAL
Abs Immature Granulocytes: 0.03 10*3/uL (ref 0.00–0.07)
Basophils Absolute: 0 10*3/uL (ref 0.0–0.1)
Basophils Relative: 0 %
Eosinophils Absolute: 0 10*3/uL (ref 0.0–0.5)
Eosinophils Relative: 0 %
Immature Granulocytes: 1 %
Lymphocytes Relative: 30 %
Lymphs Abs: 1.9 10*3/uL (ref 0.7–4.0)
Monocytes Absolute: 0.7 10*3/uL (ref 0.1–1.0)
Monocytes Relative: 11 %
Neutro Abs: 3.6 10*3/uL (ref 1.7–7.7)
Neutrophils Relative %: 58 %

## 2019-04-02 LAB — CBC
HCT: 33.1 % — ABNORMAL LOW (ref 36.0–46.0)
Hemoglobin: 10.1 g/dL — ABNORMAL LOW (ref 12.0–15.0)
MCH: 26.4 pg (ref 26.0–34.0)
MCHC: 30.5 g/dL (ref 30.0–36.0)
MCV: 86.4 fL (ref 80.0–100.0)
Platelets: 304 10*3/uL (ref 150–400)
RBC: 3.83 MIL/uL — ABNORMAL LOW (ref 3.87–5.11)
RDW: 13.7 % (ref 11.5–15.5)
WBC: 6.3 10*3/uL (ref 4.0–10.5)
nRBC: 0 % (ref 0.0–0.2)

## 2019-04-02 LAB — RAPID HIV SCREEN (HIV 1/2 AB+AG)
HIV 1/2 Antibodies: NONREACTIVE
HIV-1 P24 Antigen - HIV24: NONREACTIVE

## 2019-04-02 LAB — OB RESULTS CONSOLE HIV ANTIBODY (ROUTINE TESTING): HIV: NONREACTIVE

## 2019-04-02 LAB — OB RESULTS CONSOLE RPR: RPR: NONREACTIVE

## 2019-04-02 LAB — OB RESULTS CONSOLE GBS: GBS: NEGATIVE

## 2019-04-02 LAB — OB RESULTS CONSOLE RUBELLA ANTIBODY, IGM: Rubella: IMMUNE

## 2019-04-02 LAB — OB RESULTS CONSOLE HEPATITIS B SURFACE ANTIGEN: Hepatitis B Surface Ag: NEGATIVE

## 2019-04-02 NOTE — ED Notes (Signed)
Pt states that she just felt the baby move.

## 2019-04-02 NOTE — ED Notes (Signed)
Rapid OB bedside with patient

## 2019-04-02 NOTE — ED Notes (Signed)
Pt reports feeling fetal movement several times in room.

## 2019-04-02 NOTE — ED Triage Notes (Signed)
Per pt, states she is [redacted] weeks pregnant, states the last time she felt baby move yesterday--states she last saw her OBGYN 2 weeks ago-states she is from [place]-slight pelvic discomfort-does not feel like she is contracting-2nd pregnancy was normal

## 2019-04-02 NOTE — ED Notes (Signed)
Pt on fetal monitor. Pt has reactive strip with accelerations and moderate variability, FHR baseline 140.

## 2019-04-02 NOTE — ED Provider Notes (Signed)
[facility] DEPT Provider Note   CSN: [id number] Arrival date & time: 04/02/19  1823     History   Chief Complaint Chief Complaint  Patient presents with  . Decreased Fetal Movement    HPI Julie [name] is a 23 y.o. female.     23 year old female G2P1001 at [redacted] weeks gestation who presents with decreased fetal movement.  Patient recently relocated from [place], last saw her OB/GYN in [place] 2 weeks ago.  She has had an uncomplicated pregnancy, denies any medical problems or any medications.  Today she has felt decreased fetal movement, last time she remembers feeling the baby move was yesterday.  She denies any significant abdominal pain.  No passage of fluid or blood.  No fevers or recent illness.  Since being in the ED, she has felt the baby move several times.  The history is provided by the patient.    Past Medical History:  Diagnosis Date  . GERD (gastroesophageal reflux disease)     Patient Active Problem List   Diagnosis Date Noted  . Gallstone pancreatitis 12/26/2017  . 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 CHOLECYSTECTOMY N/A 01/09/2018   Procedure: LAPAROSCOPIC CHOLECYSTECTOMY WITH INTRAOPERATIVE CHOLANGIOGRAM;  Surgeon: Fanny Skates, MD;  Location: [facility];  Service: General;  Laterality: N/A;  . NO PAST SURGERIES       OB History    Gravida  2   Para  1   Term  1   Preterm      AB      Living  1     SAB      TAB      Ectopic      Multiple  0   Live Births  1            Home Medications    Prior to Admission medications   Medication Sig Start Date End Date Taking? Authorizing Provider  acetaminophen (TYLENOL) 500 MG tablet Take 1,000 mg by mouth every 6 (six) hours as needed for moderate pain.   Yes [provider]    Family History Family History  Problem Relation Age of Onset  . Diabetes Mother   .  Pancreatic cancer Father   . Cancer Maternal Grandfather     Social History Social History   Tobacco Use  . Smoking status: Never Smoker  . Smokeless tobacco: Never Used  Substance Use Topics  . Alcohol use: No  . Drug use: No     Allergies   Patient has no known allergies.   Review of Systems Review of Systems All other systems reviewed and are negative except that which was mentioned in HPI   Physical Exam Updated Vital Signs BP 123/82   Pulse 94   Temp 97.8 F (36.6 C) (Oral)   Resp 18   Wt 70.5 kg   LMP 07/16/2018 (Exact Date)   SpO2 99%   BMI 25.86 kg/m   Physical Exam Vitals signs and nursing note reviewed.  Constitutional:      General: She is not in acute distress.    Appearance: She is well-developed.  HENT:     Head: Normocephalic and atraumatic.  Eyes:     Conjunctiva/sclera: Conjunctivae normal.  Neck:     Musculoskeletal: Neck supple.  Cardiovascular:     Rate and Rhythm: Normal rate and regular rhythm.     Heart sounds: Normal heart sounds. No murmur.  Pulmonary:     Effort: Pulmonary effort is normal.     Breath sounds: Normal breath sounds.  Abdominal:     Palpations: Abdomen is soft.     Tenderness: There is no abdominal tenderness.     Comments: Uterine fundus at xyphoid process, no abd tenderness  Skin:    General: Skin is warm and dry.  Neurological:     Mental Status: She is alert and oriented to person, place, and time.     Comments: Fluent speech  Psychiatric:        Judgment: Judgment normal.      ED Treatments / Results  Labs (all labs ordered are listed, but only abnormal results are displayed) Labs Reviewed  CBC - Abnormal; Notable for the following components:      Result Value   RBC 3.83 (*)    Hemoglobin 10.1 (*)    HCT 33.1 (*)    All other components within normal limits  CULTURE, BETA STREP (GROUP B ONLY)  DIFFERENTIAL  HEPATITIS B SURFACE ANTIGEN  RUBELLA SCREEN  RPR  RAPID HIV SCREEN (HIV 1/2  AB+AG)  TYPE AND SCREEN    EKG None  Radiology No results found.  Procedures Procedures (including critical care time)  Medications Ordered in ED Medications - No data to display   Initial Impression / Assessment and Plan / ED Course  I have reviewed the triage vital signs and the nursing notes.  Pertinent labs & imaging results that were available during my care of the patient were reviewed by me and considered in my medical decision making (see chart for details).        Reassuring vital signs, fetal heart rate baseline 140 with normal variability and reassuring reactive strip.  No consistent contractions.  Patient evaluated by OB response nurse, Denny PeonErin, who spoke with [facility] on-call, Dr. Adrian BlackwaterStinson. He has recommended sending off screening lab work and follow-up in the clinic.  Should comfortable on reassessment and endorsing good fetal movement.  I have extensively reviewed return precautions and discussed routine third trimester care.  Final Clinical Impressions(s) / ED Diagnoses   Final diagnoses:  Decreased fetal movements in third trimester, single or unspecified fetus    ED Discharge Orders    None       Bomani Oommen, Ambrose Finlandachel Morgan, MD 04/02/19 2226

## 2019-04-03 ENCOUNTER — Telehealth: Payer: Self-pay

## 2019-04-03 LAB — TYPE AND SCREEN
ABO/RH(D): O POS
Antibody Screen: POSITIVE

## 2019-04-03 LAB — RPR: RPR Ser Ql: NONREACTIVE

## 2019-04-03 NOTE — ED Notes (Signed)
Pt. Presenting to ED for not feeling fetal movement since yesterday. Pt. States she lives in [place] and last seen for prenatal care 2 weeks ago. Pt. First baby in 2019 delivered at [facility] [place].  Attending physician notified for FHR tracing. He recommended ED physician to order prenatal labs. Pt. States feeling fetal movement since being placed on FHR monitor. Pt. Educated on follow-up prenatal care and where to come if any other concerns or in labor. Pt. Denies LOF, CTX, or vaginal bleeding. Pt. Denies any issues with current or previous pregnancy.

## 2019-04-03 NOTE — Telephone Encounter (Signed)
°  Received a staff message from [facility] that the patient needed to be scheduled for a New OB.  I was unable to speak with the patient, so I left a voicemail asking the patient to call the office for an appointment.

## 2019-04-04 LAB — RUBELLA SCREEN: Rubella: 3.2 index (ref >0.99)

## 2019-04-04 LAB — HEPATITIS B SURFACE ANTIGEN: Hepatitis B Surface Ag: NEGATIVE

## 2019-04-05 LAB — CULTURE, BETA STREP (GROUP B ONLY)

## 2019-04-06 ENCOUNTER — Telehealth: Payer: Self-pay | Admitting: Emergency Medicine

## 2019-04-21 ENCOUNTER — Inpatient Hospital Stay (HOSPITAL_COMMUNITY): Payer: Medicaid Other | Admitting: Anesthesiology

## 2019-04-21 ENCOUNTER — Other Ambulatory Visit: Payer: Self-pay

## 2019-04-21 ENCOUNTER — Encounter (HOSPITAL_COMMUNITY): Payer: Self-pay | Admitting: *Deleted

## 2019-04-21 ENCOUNTER — Inpatient Hospital Stay (HOSPITAL_COMMUNITY)
Admission: EM | Admit: 2019-04-21 | Discharge: 2019-04-23 | DRG: 807 | Disposition: A | Discharge status: Home / Self Care | Payer: Medicaid Other | Attending: Family Medicine | Admitting: Family Medicine

## 2019-04-21 DIAGNOSIS — Z3A39 39 weeks gestation of pregnancy: Secondary | ICD-10-CM

## 2019-04-21 DIAGNOSIS — O26893 Other specified pregnancy related conditions, third trimester: Secondary | ICD-10-CM | POA: Diagnosis present

## 2019-04-21 DIAGNOSIS — D649 Anemia, unspecified: Secondary | ICD-10-CM | POA: Diagnosis present

## 2019-04-21 DIAGNOSIS — Z1159 Encounter for screening for other viral diseases: Secondary | ICD-10-CM

## 2019-04-21 DIAGNOSIS — O9902 Anemia complicating childbirth: Principal | ICD-10-CM | POA: Diagnosis present

## 2019-04-21 DIAGNOSIS — O4290 Premature rupture of membranes, unspecified as to length of time between rupture and onset of labor, unspecified weeks of gestation: Secondary | ICD-10-CM | POA: Diagnosis present

## 2019-04-21 LAB — CBC
HCT: 31.1 % — ABNORMAL LOW (ref 36.0–46.0)
Hemoglobin: 9.9 g/dL — ABNORMAL LOW (ref 12.0–15.0)
MCH: 26.6 pg (ref 26.0–34.0)
MCHC: 31.8 g/dL (ref 30.0–36.0)
MCV: 83.6 fL (ref 80.0–100.0)
Platelets: 294 10*3/uL (ref 150–400)
RBC: 3.72 MIL/uL — ABNORMAL LOW (ref 3.87–5.11)
RDW: 14.5 % (ref 11.5–15.5)
WBC: 6.6 10*3/uL (ref 4.0–10.5)
nRBC: 0 % (ref 0.0–0.2)

## 2019-04-21 LAB — RAPID URINE DRUG SCREEN, HOSP PERFORMED
Amphetamines: NOT DETECTED
Barbiturates: NOT DETECTED
Benzodiazepines: NOT DETECTED
Cocaine: NOT DETECTED
Opiates: NOT DETECTED
Tetrahydrocannabinol: NOT DETECTED

## 2019-04-21 LAB — HEMOGLOBIN A1C
Hgb A1c MFr Bld: 5.4 % (ref 4.8–5.6)
Mean Plasma Glucose: 108.28 mg/dL

## 2019-04-21 LAB — POCT FERN TEST: POCT Fern Test: NEGATIVE

## 2019-04-21 LAB — SARS CORONAVIRUS 2 BY RT PCR (HOSPITAL ORDER, PERFORMED IN ~~LOC~~ HOSPITAL LAB): SARS Coronavirus 2: NEGATIVE

## 2019-04-21 LAB — AMNISURE RUPTURE OF MEMBRANE (ROM) NOT AT ARMC: Amnisure ROM: POSITIVE

## 2019-04-21 MED ORDER — LACTATED RINGERS IV SOLN
INTRAVENOUS | Status: DC
  Administered 2019-04-21 (×2): via INTRAVENOUS

## 2019-04-21 MED ORDER — FLEET ENEMA 7-19 GM/118ML RE ENEM: 1.0000 | ENEMA | RECTAL | Status: DC | PRN

## 2019-04-21 MED ORDER — PHENYLEPHRINE 40 MCG/ML (10ML) SYRINGE FOR IV PUSH (FOR BLOOD PRESSURE SUPPORT): 80.0000 ug | PREFILLED_SYRINGE | INTRAVENOUS | Status: DC | PRN

## 2019-04-21 MED ORDER — SODIUM CHLORIDE (PF) 0.9 % IJ SOLN
INTRAMUSCULAR | Status: DC | PRN
  Administered 2019-04-21: 12 mL/h via EPIDURAL

## 2019-04-21 MED ORDER — OXYCODONE-ACETAMINOPHEN 5-325 MG PO TABS: 1.0000 | ORAL_TABLET | ORAL | Status: DC | PRN

## 2019-04-21 MED ORDER — FENTANYL-BUPIVACAINE-NACL 0.5-0.125-0.9 MG/250ML-% EP SOLN
12.0000 mL/h | EPIDURAL | Status: DC | PRN
  Filled 2019-04-21: qty 250

## 2019-04-21 MED ORDER — SOD CITRATE-CITRIC ACID 500-334 MG/5ML PO SOLN: 30.0000 mL | ORAL | Status: DC | PRN

## 2019-04-21 MED ORDER — PHENYLEPHRINE 40 MCG/ML (10ML) SYRINGE FOR IV PUSH (FOR BLOOD PRESSURE SUPPORT)
80.0000 ug | PREFILLED_SYRINGE | INTRAVENOUS | Status: DC | PRN
  Filled 2019-04-21: qty 10

## 2019-04-21 MED ORDER — OXYTOCIN BOLUS FROM INFUSION
500.0000 mL | Freq: Once | INTRAVENOUS | Status: AC
  Administered 2019-04-21: 500 mL via INTRAVENOUS

## 2019-04-21 MED ORDER — ACETAMINOPHEN 325 MG PO TABS: 650.0000 mg | ORAL_TABLET | ORAL | Status: DC | PRN

## 2019-04-21 MED ORDER — OXYTOCIN 40 UNITS IN NORMAL SALINE INFUSION - SIMPLE MED
2.5000 [IU]/h | INTRAVENOUS | Status: DC
  Administered 2019-04-21: 2.5 [IU]/h via INTRAVENOUS
  Filled 2019-04-21: qty 1000

## 2019-04-21 MED ORDER — LACTATED RINGERS IV SOLN
500.0000 mL | Freq: Once | INTRAVENOUS | Status: AC
  Administered 2019-04-21: 1000 mL via INTRAVENOUS

## 2019-04-21 MED ORDER — ONDANSETRON HCL 4 MG/2ML IJ SOLN: 4.0000 mg | Freq: Four times a day (QID) | INTRAMUSCULAR | Status: DC | PRN

## 2019-04-21 MED ORDER — LIDOCAINE HCL (PF) 1 % IJ SOLN: 30.0000 mL | INTRAMUSCULAR | Status: DC | PRN

## 2019-04-21 MED ORDER — OXYCODONE-ACETAMINOPHEN 5-325 MG PO TABS: 2.0000 | ORAL_TABLET | ORAL | Status: DC | PRN

## 2019-04-21 MED ORDER — DIPHENHYDRAMINE HCL 50 MG/ML IJ SOLN: 12.5000 mg | INTRAMUSCULAR | Status: DC | PRN

## 2019-04-21 MED ORDER — LIDOCAINE HCL (PF) 1 % IJ SOLN
INTRAMUSCULAR | Status: DC | PRN
  Administered 2019-04-21: 5 mL via EPIDURAL

## 2019-04-21 MED ORDER — EPHEDRINE 5 MG/ML INJ: 10.0000 mg | INTRAVENOUS | Status: DC | PRN

## 2019-04-21 MED ORDER — LACTATED RINGERS IV SOLN: 500.0000 mL | INTRAVENOUS | Status: DC | PRN

## 2019-04-21 NOTE — Progress Notes (Signed)
LABOR PROGRESS NOTE  Julie [name] is a 23 y.o. G2P1001 at [redacted]w[redacted]d  admitted for SROM w/ SOL.  Subjective: She is doing well, resting in bed. She is experiencing minimal discomfort with contractions.   Objective: BP 127/89   Pulse 93   Temp 97.9 F (36.6 C) (Oral)   Resp 16   Ht 5\' 5"  (1.651 m)   Wt 72.1 kg   LMP 07/16/2018 (Exact Date)   SpO2 99%   BMI 26.46 kg/m  or  Vitals:   04/21/19 1802 04/21/19 1831 04/21/19 1901 04/21/19 1921  BP: 127/74 108/87 113/69 127/89  Pulse: 86 90 (!) 104 93  Resp: 20 16 18 16   Temp: (!) 97 F (36.1 C)   97.9 F (36.6 C)  TempSrc: Oral   Oral  SpO2:      Weight:      Height:         Dilation: 8 Effacement (%): 80 Cervical Position: Middle Station: -1 Presentation: Vertex Exam by:: Krisi Azua Autry-Lott, DO FHT: baseline rate 125bpm, moderate varibility, 15 x 15 acels present, no decels Toco: 1-2 mins  Labs: Lab Results  Component Value Date   WBC 6.6 04/21/2019   HGB 9.9 (L) 04/21/2019   HCT 31.1 (L) 04/21/2019   MCV 83.6 04/21/2019   PLT 294 04/21/2019    Patient Active Problem List   Diagnosis Date Noted  . Amniotic fluid leaking 04/21/2019  . Gallstone pancreatitis 12/26/2017  . 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 G2P1001 at [redacted]w[redacted]d here for SROM w/ SOL. Labor: Continue cervical exams for progression of labor. Expectant managament. No augmentation needed at this time. Fetal Wellbeing:  Cat I Pain Control:  Epidural in place and adequate Anticipated MOD:  Vaginal  Tiaria Biby Autry-Lott, D.O. Family Medicine Resident, PGY-1 04/21/2019, 7:36 PM

## 2019-04-21 NOTE — MAU Note (Signed)
Julie [name] is a 23 y.o. at [redacted]w[redacted]d here in MAU reporting: reports she has some yellow LOF around 0930, states she was using the restroom and started leaking after she was done voiding. Has not noticed any leaking since. Reporting lower abdominal pain, states they feel like contractions. No bleeding, +FM. States at a 32 week u/s they told her the cord is wrapped around the baby's neck, denies any other complications.   Onset of complaint: this morning  Pain score: 8/10  Vitals:   04/21/19 1113  BP: 119/71  Pulse: (!) 108  Resp: 16  Temp: 98.4 F (36.9 C)  SpO2: 100%     FHT: +FM  Lab orders placed from triage: none

## 2019-04-21 NOTE — MAU Provider Note (Signed)
First Provider Initiated Contact with Patient 04/21/19 1146       S: Ms. Julie [name] is a 23 y.o. G2P1001 at [redacted]w[redacted]d  who presents to MAU today complaining of leaking of fluid since 0930 this morning. She denies vaginal bleeding. She endorses contractions. She reports normal fetal movement.    O: BP 119/71 (BP Location: Right Arm)   Pulse (!) 108   Temp 98.4 F (36.9 C) (Oral)   Resp 16   Ht 5\' 5"  (1.651 m)   Wt 71.1 kg   LMP 07/16/2018 (Exact Date)   SpO2 100%   BMI 26.08 kg/m  GENERAL: Well-developed, well-nourished female in no acute distress.  HEAD: Normocephalic, atraumatic.  CHEST: Normal effort of breathing, regular heart rate ABDOMEN: Soft, nontender, gravid PELVIC: Normal external female genitalia. Vagina is pink and rugated. Cervix with normal contour, no lesions. Normal discharge.  Small amount of clear fluid pooling.   Cervical exam:  Dilation: 4.5 Effacement (%): 70 Cervical Position: Middle Station: -2 Presentation: Vertex Exam by:: AThurman Coyer, RN   Fetal Monitoring: Baseline: 135 Variability: moderate Accelerations: 15x15 Decelerations: none Contractions: not tracing  Results for orders placed or performed during the hospital encounter of 04/21/19 (from the past 24 hour(s))  POCT fern test     Status: None   Collection Time: 04/21/19 11:59 AM  Result Value Ref Range   POCT Fern Test Negative = intact amniotic membranes   Amnisure rupture of membrane (rom)not at [facility]     Status: None   Collection Time: 04/21/19 12:29 PM  Result Value Ref Range   Amnisure ROM POSITIVE      A: SIUP at [redacted]w[redacted]d  SROM  P: Admit to birthing suites  Jorje Guild, NP 04/21/2019 1:01 PM

## 2019-04-21 NOTE — Anesthesia Procedure Notes (Signed)
Epidural Patient location during procedure: OB Start time: 04/21/2019 3:35 PM End time: 04/21/2019 4:00 PM  Staffing Anesthesiologist: Barnet Glasgow, MD Performed: anesthesiologist   Preanesthetic Checklist Completed: patient identified, site marked, surgical consent, pre-op evaluation, timeout performed, IV checked, risks and benefits discussed and monitors and equipment checked  Epidural Patient position: sitting Prep: site prepped and draped and DuraPrep Patient monitoring: continuous pulse ox and blood pressure Approach: midline Location: L2-L3 Injection technique: LOR air  Needle:  Needle type: Tuohy  Needle gauge: 17 G Needle length: 9 cm and 9 Needle insertion depth: 7 cm Catheter type: closed end flexible Catheter size: 19 Gauge Catheter at skin depth: 12 cm Test dose: negative  Assessment Events: blood not aspirated, injection not painful, no injection resistance, negative IV test and no paresthesia  Additional Notes Patient identified. Risks/Benefits/Options discussed with patient including but not limited to bleeding, infection, nerve damage, paralysis, failed block, incomplete pain control, headache, blood pressure changes, nausea, vomiting, reactions to medication both or allergic, itching and postpartum back pain. Confirmed with bedside nurse the patient's most recent platelet count. Confirmed with patient that they are not currently taking any anticoagulation, have any bleeding history or any family history of bleeding disorders. Patient expressed understanding and wished to proceed. All questions were answered. Sterile technique was used throughout the entire procedure. Please see nursing notes for vital signs. Test dose was given through epidural needle and negative prior to continuing to dose epidural or start infusion. Warning signs of high block given to the patient including shortness of breath, tingling/numbness in hands, complete motor block, or any  concerning symptoms with instructions to call for help. Patient was given instructions on fall risk and not to get out of bed. All questions and concerns addressed with instructions to call with any issues. 3 Attempt (S) . Patient tolerated procedure well.

## 2019-04-21 NOTE — Progress Notes (Signed)
Labor Progress Note Julie [name] is a 23 y.o. G2P1001 at [redacted]w[redacted]d presented for SROM/SOL  S:  Patient reporting intermittent pressure that just started  O:  BP (!) 114/101   Pulse (!) 101   Temp 97.9 F (36.6 C) (Oral)   Resp 16   Ht 5\' 5"  (1.651 m)   Wt 72.1 kg   LMP 07/16/2018 (Exact Date)   SpO2 99%   BMI 26.46 kg/m   Fetal Tracing:  Baseline: 130 Variability: moderate Accels: 15x15 Decels: variable  Toco: 1-3   CVE: Dilation: 8 Effacement (%): 80 Cervical Position: Middle Station: -1 Presentation: Vertex Exam by:: Simone Autry-Lott, DO   A&P: 23 y.o. G2P1001 [redacted]w[redacted]d SROM/SOL #Labor: Progressing well. Will continue expectant management and plan to recheck in 2-3 hours. Patient and mother very concerned about nuchal chord that was seen on u/s in [place]. Discussed normalcy of finding and expectations at delivery.  #Pain: epidural #FWB: Cat 1 #GBS negative  Wende Mott, CNM 9:29 PM

## 2019-04-21 NOTE — H&P (Addendum)
LABOR AND DELIVERY ADMISSION HISTORY AND PHYSICAL NOTE  Julie SanderShanteka [name] is a 23 y.o. female G2P1001 with IUP at 7854w6d by LMP presenting for SOL w/ SROM @930 . She reports positive fetal movement. She denies vaginal bleeding.  Prenatal History/Complications: [facility] [place] Pregnancy complications:  - Patient reported nuchal found on U/S - no records available  Past Medical History: Past Medical History:  Diagnosis Date  . GERD (gastroesophageal reflux disease)     Past Surgical History: Past Surgical History:  Procedure Laterality Date  . CHOLECYSTECTOMY N/A 01/09/2018   Procedure: LAPAROSCOPIC CHOLECYSTECTOMY WITH INTRAOPERATIVE CHOLANGIOGRAM;  Surgeon: Claud KelpIngram, Haywood, MD;  Location: [facility];  Service: General;  Laterality: N/A;  . NO PAST SURGERIES      Obstetrical History: OB History    Gravida  2   Para  1   Term  1   Preterm      AB      Living  1     SAB      TAB      Ectopic      Multiple  0   Live Births  1           Social History: Social History   Socioeconomic History  . Marital status: Single    Spouse name: Not on file  . Number of children: Not on file  . Years of education: Not on file  . Highest education level: Not on file  Occupational History  . Not on file  Social Needs  . Financial resource strain: Not on file  . Food insecurity    Worry: Not on file    Inability: Not on file  . Transportation needs    Medical: Not on file    Non-medical: Not on file  Tobacco Use  . Smoking status: Never Smoker  . Smokeless tobacco: Never Used  Substance and Sexual Activity  . Alcohol use: No  . Drug use: No  . Sexual activity: Yes    Comment: pt is 3 wks postpartum  Lifestyle  . Physical activity    Days per week: Not on file    Minutes per session: Not on file  . Stress: Not on file  Relationships  . Social Musicianconnections    Talks on phone: Not on file    Gets together: Not on file     Attends religious service: Not on file    Active member of club or organization: Not on file    Attends meetings of clubs or organizations: Not on file    Relationship status: Not on file  Other Topics Concern  . Not on file  Social History Narrative  . Not on file    Family History: Family History  Problem Relation Age of Onset  . Diabetes Mother   . Pancreatic cancer Father   . Cancer Maternal Grandfather     Allergies: No Known Allergies  Medications Prior to Admission  Medication Sig Dispense Refill Last Dose  . acetaminophen (TYLENOL) 500 MG tablet Take 1,000 mg by mouth every 6 (six) hours as needed for moderate pain.        Review of Systems  All systems reviewed and negative except as stated in HPI  Physical Exam Blood pressure 119/71, pulse (!) 108, temperature 98.4 F (36.9 C), temperature source Oral, resp. rate 16, height 5\' 5"  (1.651 m), weight 71.1 kg, last menstrual period 07/16/2018, SpO2 100 %, not currently breastfeeding. General appearance: alert, oriented  Lungs: normal respiratory effort Abdomen: soft, non-tender; gravid, FH appropriate for GA Extremities: No calf swelling or tenderness Presentation: cephalic on U/S performed by me 07/19 Fetal monitoring: 140 bpm moderate variability 15 x 15 acels present, no decels Uterine activity: 6-7 mins with prolonged ctx Dilation: 4.5 Effacement (%): 70 Station: -2 Exam by:: Jannifer Franklin, RN  Prenatal labs: ABO, Rh: --/--/O POS (06/30 2142) Antibody: POS (06/30 2142) Rubella: 3.20 (06/30 2142) RPR: Non Reactive (06/30 2142)  HBsAg: Negative (06/30 2141)  HIV: NON REACTIVE (06/30 2142)  GC/Chlamydia: N/A see labs after collection. GBS:  Negative (07/03 1209) 2-hr GTT: N/A Genetic screening: N/A Anatomy US: N/A  Prenatal Transfer Tool  Maternal Diabetes: N/A Genetic Screening: N/A Maternal Ultrasounds/Referrals: N/A Fetal Ultrasounds or other Referrals: N/A  Maternal Substance Abuse:   Denies Significant Maternal Medications:  None Significant Maternal Lab Results: GBS negative  Results for orders placed or performed during the hospital encounter of 04/21/19 (from the past 24 hour(s))  POCT fern test   Collection Time: 04/21/19 11:59 AM  Result Value Ref Range   POCT Fern Test Negative = intact amniotic membranes   Amnisure rupture of membrane (rom)not at [facility]   Collection Time: 04/21/19 12:29 PM  Result Value Ref Range   Amnisure ROM POSITIVE    Patient Active Problem List   Diagnosis Date Noted  . Gallstone pancreatitis 12/26/2017  . 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 G2P1001 at [redacted]w[redacted]d here for SROM and SOL.  #Labor: Continue cervical exams for progression of labor. Pitocin will be considered at next exam. #Pain: Maternally supported. May have an epidural #FWB: Cat I #ID:  GBS negative #MOF: Breast #MOC: None  Gerlene Fee, DO Family Medicine Resident, PGY-1 04/21/2019, 1:10 PM  Midwife attestation: I have seen and examined this patient; I agree with above documentation in the resident's note.   PE: Gen: calm comfortable, NAD Resp: normal effort and rate Abd: gravid  ROS, labs, PMH reviewed  Assessment/Plan: Julie [name] is a 23 y.o. G2P1001 here for labor/SROM Admit to LD Labor: early active FWB: Cat I GBS neg Expectant mngt Anticipate SVD  [name], CNM  04/21/2019, 5:10 PM

## 2019-04-21 NOTE — Anesthesia Preprocedure Evaluation (Signed)
Anesthesia Evaluation  Patient identified by MRN, date of birth, ID band Patient awake    Reviewed: Allergy & Precautions, NPO status , Patient's Chart, lab work & pertinent test results  Airway Mallampati: II  TM Distance: >3 FB Neck ROM: Full    Dental no notable dental hx. (+) Teeth Intact   Pulmonary neg pulmonary ROS,    Pulmonary exam normal breath sounds clear to auscultation       Cardiovascular Normal cardiovascular exam Rhythm:Regular Rate:Normal     Neuro/Psych negative neurological ROS  negative psych ROS   GI/Hepatic   Endo/Other    Renal/GU      Musculoskeletal   Abdominal   Peds  Hematology  (+) anemia , Hgb 9.6 Plt 294   Anesthesia Other Findings   Reproductive/Obstetrics (+) Pregnancy                             Anesthesia Physical Anesthesia Plan  ASA: II  Anesthesia Plan: Epidural   Post-op Pain Management:    Induction:   PONV Risk Score and Plan:   Airway Management Planned:   Additional Equipment:   Intra-op Plan:   Post-operative Plan:   Informed Consent: I have reviewed the patients History and Physical, chart, labs and discussed the procedure including the risks, benefits and alternatives for the proposed anesthesia with the patient or authorized representative who has indicated his/her understanding and acceptance.       Plan Discussed with:   Anesthesia Plan Comments: (39 6/& WK g2p1 for LEA)        Anesthesia Quick Evaluation

## 2019-04-21 NOTE — Progress Notes (Signed)
LABOR PROGRESS NOTE  Julie [name] is a 23 y.o. G2P1001 at [redacted]w[redacted]d  admitted for SROM w/ SOL.  Subjective: She is doing well resting in bed. She has just received an epidural and is experiencing mild discomfort with contractions.  Objective: BP (!) 120/45   Pulse (!) 101   Temp 97.9 F (36.6 C) (Oral)   Resp 20   Ht 5\' 5"  (1.651 m)   Wt 72.1 kg   LMP 07/16/2018 (Exact Date)   SpO2 99%   BMI 26.46 kg/m  or  Vitals:   04/21/19 1616 04/21/19 1621 04/21/19 1626 04/21/19 1631  BP: 129/86 126/82 131/78 (!) 120/45  Pulse: 92 92 97 (!) 101  Resp: 17  15 20   Temp:      TempSrc:      SpO2: 100% 100% 100% 99%  Weight:      Height:         Dilation: 7 Effacement (%): 70 Cervical Position: Middle Station: -1 Presentation: Vertex Exam by:: Dr Lavonia Drafts FHT: 120 bpm baseline rate , moderate varibility, 15 x15 acel,  No decel Toco: 6-7 mins  Labs: Lab Results  Component Value Date   WBC 6.6 04/21/2019   HGB 9.9 (L) 04/21/2019   HCT 31.1 (L) 04/21/2019   MCV 83.6 04/21/2019   PLT 294 04/21/2019    Patient Active Problem List   Diagnosis Date Noted  . Amniotic fluid leaking 04/21/2019  . Gallstone pancreatitis 12/26/2017  . 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 G2P1001 at [redacted]w[redacted]d here for SROM w/ SOL.  Labor: Continue cervical exams for the progression of labor. Expectant management of labor. No augmentation needed at this time. Fetal Wellbeing:  Cat I Pain Control:  Epidural in place and adequate Anticipated MOD: Vaginal  Arda Daggs Autry-Lott, D.O. Family Medicine Resident, PGY-1 04/21/2019, 4:35 PM

## 2019-04-21 NOTE — Discharge Summary (Addendum)
Postpartum Discharge Summary     Patient Name: Julie [name] DOB: April 16, 1996 MRN: [id number]  Date of admission: 04/21/2019 Delivering Provider: Wende Mott   Date of discharge: 04/23/2019  Admitting diagnosis: labor Intrauterine pregnancy: [redacted]w[redacted]d     Secondary diagnosis:  Active Problems:   Amniotic fluid leaking  Additional problems: prenatal care done in [place]     Discharge diagnosis: Term Pregnancy Delivered                                                                                                Post partum procedures:n/a  Augmentation: none  Complications: None  Hospital course:  Onset of Labor With Vaginal Delivery     23 y.o. yo G2P1001 at [redacted]w[redacted]d was admitted in Active Labor on 04/21/2019. Patient had an uncomplicated labor course as follows:  Membrane Rupture Time/Date: 9:30 AM ,04/21/2019   Intrapartum Procedures: Episiotomy:                                          Lacerations:  1st degree [2];Perineal [11]  Patient had a delivery of a Viable infant. 04/21/2019  Information for the patient's newborn:  Carmie, Lanpher [[id number]]  Delivery Method: Vaginal, Spontaneous(Filed from Delivery Summary)     Pateint had an uncomplicated postpartum course.  She is ambulating, tolerating a regular diet, passing flatus, and urinating well. Patient is discharged home in stable condition on 04/23/19.   Magnesium Sulfate recieved: No BMZ received: No  Physical exam  Vitals:   04/22/19 1000 04/22/19 1500 04/22/19 2200 04/23/19 0500  BP: 122/74 116/78 117/79 122/84  Pulse: (!) 101 100 (!) 101 84  Resp: 18 18 19 16   Temp: 98.5 F (36.9 C) 98.5 F (36.9 C) 98.6 F (37 C) 97.8 F (36.6 C)  TempSrc: Oral Oral Oral Oral  SpO2:    96%  Weight:      Height:       General: alert and cooperative Lochia: appropriate Uterine Fundus: firm Incision: N/A DVT Evaluation: No evidence of DVT seen on physical exam. Labs: Lab Results  Component Value Date   WBC  12.4 (H) 04/22/2019   HGB 8.1 (L) 04/22/2019   HCT 25.7 (L) 04/22/2019   MCV 83.4 04/22/2019   PLT 246 04/22/2019   CMP Latest Ref Rng & Units 02/01/2018  Glucose 65 - 99 mg/dL 83  BUN 6 - 20 mg/dL 7  Creatinine 0.44 - 1.00 mg/dL 0.60  Sodium 135 - 145 mmol/L 140  Potassium 3.5 - 5.1 mmol/L 3.9  Chloride 101 - 111 mmol/L 103  CO2 22 - 32 mmol/L -  Calcium 8.9 - 10.3 mg/dL -  Total Protein 6.5 - 8.1 g/dL -  Total Bilirubin 0.3 - 1.2 mg/dL -  Alkaline Phos 38 - 126 U/L -  AST 15 - 41 U/L -  ALT 14 - 54 U/L -    Discharge instruction: per After Visit Summary and "Baby and Me Booklet".  After visit meds:  Allergies as of  04/23/2019   No Known Allergies     Medication List    TAKE these medications   acetaminophen 500 MG tablet Commonly known as: TYLENOL Take 1,000 mg by mouth every 6 (six) hours as needed for moderate pain.   ibuprofen 600 MG tablet Commonly known as: ADVIL Take 1 tablet (600 mg total) by mouth every 6 (six) hours.   senna-docusate 8.6-50 MG tablet Commonly known as: Senokot-S Take 2 tablets by mouth daily. Start taking on: April 24, 2019       Diet: routine diet  Activity: Advance as tolerated. Pelvic rest for 6 weeks.   Outpatient follow up:4 weeks Follow up Appt: Future Appointments  Date Time Provider Department Center  05/21/2019  9:35 AM Rolm BookbinderNeill, Caroline M, CNM [facility]   Follow up Visit: Follow-up Information    [facility]. Go on 05/21/2019.   Specialty: Obstetrics and Gynecology Why: For postpartum follow up with Cleone Slimaroline Neill at 9:35 am  Contact information: [address], [address] [id number] mc [place] [phone]         Please schedule this patient for PP visit in: 4 weeks Low risk pregnancy complicated by: prenatal care in [place] Delivery mode:  SVD Anticipated Birth Control:  none PP Procedures needed: n/a  Schedule Integrated BH visit:  no Provider: Any provider    Newborn Data: Live born female  Birth Weight:  3201g  APGAR: 9, 9  Newborn Delivery   Birth date/time: 04/21/2019 22:21:00 Delivery type: Vaginal, Spontaneous      Baby Feeding: Breast Disposition:rooming in due to PTX    04/23/2019 De Hollingsheadatherine L Wallace, DO

## 2019-04-22 LAB — CBC
HCT: 25.7 % — ABNORMAL LOW (ref 36.0–46.0)
Hemoglobin: 8.1 g/dL — ABNORMAL LOW (ref 12.0–15.0)
MCH: 26.3 pg (ref 26.0–34.0)
MCHC: 31.5 g/dL (ref 30.0–36.0)
MCV: 83.4 fL (ref 80.0–100.0)
Platelets: 246 10*3/uL (ref 150–400)
RBC: 3.08 MIL/uL — ABNORMAL LOW (ref 3.87–5.11)
RDW: 14.4 % (ref 11.5–15.5)
WBC: 12.4 10*3/uL — ABNORMAL HIGH (ref 4.0–10.5)
nRBC: 0 % (ref 0.0–0.2)

## 2019-04-22 MED ORDER — BENZOCAINE-MENTHOL 20-0.5 % EX AERO
1.0000 "application " | INHALATION_SPRAY | CUTANEOUS | Status: DC | PRN
  Administered 2019-04-23: 1 via TOPICAL
  Filled 2019-04-22: qty 56

## 2019-04-22 MED ORDER — TETANUS-DIPHTH-ACELL PERTUSSIS 5-2.5-18.5 LF-MCG/0.5 IM SUSP: 0.5000 mL | Freq: Once | INTRAMUSCULAR | Status: DC

## 2019-04-22 MED ORDER — SENNOSIDES-DOCUSATE SODIUM 8.6-50 MG PO TABS
2.0000 | ORAL_TABLET | ORAL | Status: DC
  Filled 2019-04-22: qty 2

## 2019-04-22 MED ORDER — DIPHENHYDRAMINE HCL 25 MG PO CAPS: 25.0000 mg | ORAL_CAPSULE | Freq: Four times a day (QID) | ORAL | Status: DC | PRN

## 2019-04-22 MED ORDER — ONDANSETRON HCL 4 MG/2ML IJ SOLN: 4.0000 mg | INTRAMUSCULAR | Status: DC | PRN

## 2019-04-22 MED ORDER — DIBUCAINE (PERIANAL) 1 % EX OINT: 1.0000 "application " | TOPICAL_OINTMENT | CUTANEOUS | Status: DC | PRN

## 2019-04-22 MED ORDER — WITCH HAZEL-GLYCERIN EX PADS: 1.0000 "application " | MEDICATED_PAD | CUTANEOUS | Status: DC | PRN

## 2019-04-22 MED ORDER — SIMETHICONE 80 MG PO CHEW: 80.0000 mg | CHEWABLE_TABLET | ORAL | Status: DC | PRN

## 2019-04-22 MED ORDER — ACETAMINOPHEN 325 MG PO TABS
650.0000 mg | ORAL_TABLET | ORAL | Status: DC | PRN
  Administered 2019-04-22: 10:00:00 650 mg via ORAL
  Filled 2019-04-22: qty 2

## 2019-04-22 MED ORDER — ONDANSETRON HCL 4 MG PO TABS: 4.0000 mg | ORAL_TABLET | ORAL | Status: DC | PRN

## 2019-04-22 MED ORDER — COCONUT OIL OIL: 1.0000 "application " | TOPICAL_OIL | Status: DC | PRN

## 2019-04-22 MED ORDER — PRENATAL MULTIVITAMIN CH
1.0000 | ORAL_TABLET | Freq: Every day | ORAL | Status: DC
  Administered 2019-04-22 – 2019-04-23 (×2): 1 via ORAL
  Filled 2019-04-22 (×2): qty 1

## 2019-04-22 MED ORDER — ZOLPIDEM TARTRATE 5 MG PO TABS: 5.0000 mg | ORAL_TABLET | Freq: Every evening | ORAL | Status: DC | PRN

## 2019-04-22 MED ORDER — IBUPROFEN 600 MG PO TABS
600.0000 mg | ORAL_TABLET | Freq: Four times a day (QID) | ORAL | Status: DC
  Administered 2019-04-22 – 2019-04-23 (×4): 600 mg via ORAL
  Filled 2019-04-22 (×5): qty 1

## 2019-04-22 NOTE — Clinical Social Work Maternal (Signed)
CLINICAL SOCIAL WORK MATERNAL/CHILD NOTE  Patient Details  Name: Julie [name] MRN: [id number] Date of Birth: 27-Mar-1996  Date:  04/22/2019  Clinical Social Worker Initiating Note:  Hortencia PilarKierra Massimiliano Rohleder, LCSW Date/Time: Initiated:  04/22/19/1000     Child's Name:  Julie [name]   Biological Parents:  Mother, Father   Need for Interpreter:  None   Reason for Referral:  Late or No Prenatal Care    Address:  [address] [place] [address]    Phone number:  [phone] (home)     Additional phone number: none   Household Members/Support Persons (HM/SP):   Household Member/Support Person 2   HM/SP Name Relationship DOB or Age  HM/SP -1   Cherrisse Jones  Paternal Grandmother   6760-21 years old  HM/SP -2 Julie SanderShanteka [address] [facility][name]  27-Mar-1996  HM/SP -3   Harmony [name]  sibling   21 years old   HM/SP -4        HM/SP -5        HM/SP -6        HM/SP -7        HM/SP -8          Natural Supports (not living in the home):  Other (Comment)(sister and FOB's Mom)   Professional Supports: Therapist([name] see's a therapist in [place].)   Employment: Unemployed   Type of Work: none   Education:  9 to 11 years   Homebound arranged: No  Financial Resources:  [employer]   Other Resources:  Sales executiveood Stamps , WIC(Plans to apply for WIC in [place].)   Cultural/Religious Considerations Which May Impact Care:  none reported   Strengths:  Ability to meet basic needs , Compliance with medical plan , Home prepared for child    Psychotropic Medications:   none       Pediatrician:     [facility] (unsure if going back to [place] or staying in [place])   Pediatrician List:   [place]    [place]    [place]    [place]    [place]    [place]      Pediatrician Fax Number:    Risk Factors/Current Problems:  Other (Comment)(Reports having Open CPS case in [place].)   Cognitive State:  Alert , Able to Concentrate    Mood/Affect:  Flat ,  Calm , Comfortable    CSW Assessment: CSW consulted as [name] [facility]. CSW went to speak with [name] at bedside to address further needs. Upon entering the room, CSW congratulated [name] on the birth of infant. CSW advised [name] of the reason for the visit as well.   [name] expressed that she came back to [place] from [place]. [name] reports that she moved to [place] to get away and to get a "fresh start". CSW asked [name] how long she had been in [place] and [name] unable to answer. CSW asked [name] what brought her back to [place] [name] reports that she gave birth to her one year here in [place] "so I wanted to come have this one here to. CSW understanding and [name] about [facility] while in [place] as staff unable to locate records. [name] reported that she got [facility] [place] [facility] [place] but was aware that records couldn't be found. CSW advised [name] of the hospital drug screen policy regarding no [facility] Records. [name] expressed understanding and asked no further questions.    CSW asked [name] about FOB of  infant and one year. CSW was advised per [name] "his name is Ronda Fairly, but he cant have any contact or be around her" (referring to one year old daughter Demetrius Charity). CSW asked [name] why was this. [name] reported that she has an open CPS case in [place] as one year daughter had bruising on head and other parts of her body. [name] reported that as result of this Sydnee Cabal is not allowed to be around her. CSW asked [name] if she recalled the CPS worker name and [name] denied knowing at this time. CSW asked [name] where FOB was at this time. [name] reports "hes in rehab and has been there for about a month". CSW asked [name] if it was substance use rehab or rehab for anger-[name] unsure. CSW advised [name] that CSW would be reaching out to CPS in [facility]) to see about previous case. [name] understanding and agreeable to CSW doing so.   CSW was advised that [name] is unsure if she will be going back to [place] in the  future but  does plan to return to the listed address once discharged from the hospital. CSW offered [name] resources here in the  [facility] area in the event that [name] remains in [place] longer than interned and [name] declined at this time. CSW provided [name] Information on SIDS as well as PPD.   CSW has also made report to [place] as [name] expressed having open case in [place]..   CSW Plan/Description:  CSW Will Continue to Monitor Umbilical Cord Tissue Drug Screen Results and Make Report if Warranted, Child Protective Service Report , [place], Sudden Infant Death Syndrome (SIDS) Education, Perinatal Mood and Anxiety Disorder (PMADs) Education    Wetzel Bjornstad, Doran 04/22/2019, 11:02 AM

## 2019-04-22 NOTE — Anesthesia Postprocedure Evaluation (Signed)
Anesthesia Post Note  Patient: Julie [name]  Procedure(s) Performed: AN AD [place]     Patient location during evaluation: Mother Baby Anesthesia Type: Epidural Level of consciousness: awake and alert and oriented Pain management: satisfactory to patient Vital Signs Assessment: post-procedure vital signs reviewed and stable Respiratory status: spontaneous breathing and nonlabored ventilation Cardiovascular status: stable Postop Assessment: no headache, no backache, no signs of nausea or vomiting, adequate PO intake, patient able to bend at knees and able to ambulate (patient up walking) Anesthetic complications: no    Last Vitals:  Vitals:   04/22/19 0110 04/22/19 0510  BP: 118/76 (!) (P) 125/97  Pulse: 96 (P) 89  Resp: 18 (P) 18  Temp: 36.8 C (P) 36.6 C  SpO2:      Last Pain:  Vitals:   04/22/19 0510  TempSrc: (P) Axillary  PainSc: 0-No pain   Pain Goal:                   Josejulian [place]

## 2019-04-22 NOTE — Progress Notes (Signed)
POSTPARTUM PROGRESS NOTE  Post Partum Day 1  Subjective:  Julie [name] is a 23 y.o. [id number] s/p SVD at [redacted]w[redacted]d.  She reports she is doing well. No acute events overnight. She denies any problems with ambulating, voiding or po intake. Denies nausea or vomiting.  Pain is well controlled.  Lochia has decreased and is appropriate.  Objective: Blood pressure (!) (P) 125/97, pulse (P) 89, temperature (P) 97.8 F (36.6 C), temperature source (P) Axillary, resp. rate (P) 18, height 5\' 5"  (1.651 m), weight 72.1 kg, last menstrual period 07/16/2018, SpO2 99 %, unknown if currently breastfeeding.  Physical Exam:  General: alert, cooperative and no distress Chest: no respiratory distress Heart: distal pulses intact Abdomen: soft, nontender,  Uterine Fundus: firm, appropriately tender DVT Evaluation: No calf swelling or tenderness Extremities: No edema Skin: warm, dry  Recent Labs    04/21/19 1355 04/22/19 0344  HGB 9.9* 8.1*  HCT 31.1* 25.7*    Assessment/Plan: Julie [name] is a 23 y.o. [id number] s/p SVD at [redacted]w[redacted]d   PPD# 1 - Doing well Routine postpartum care Contraception: None Feeding: Breast Dispo: Plan for discharge home 04/23/19.   LOS: 1 day   [employer], D.O. Family Medicine Resident, PGY-1  04/22/2019, 7:44 AM

## 2019-04-23 LAB — RPR: RPR Ser Ql: NONREACTIVE

## 2019-04-23 MED ORDER — SENNOSIDES-DOCUSATE SODIUM 8.6-50 MG PO TABS: 2.0000 | ORAL_TABLET | ORAL | 0 refills | Status: DC

## 2019-04-23 MED ORDER — IBUPROFEN 600 MG PO TABS: 600.0000 mg | ORAL_TABLET | Freq: Four times a day (QID) | ORAL | 1 refills | Status: DC

## 2019-04-23 NOTE — Discharge Instructions (Signed)

## 2019-04-23 NOTE — Progress Notes (Signed)
CSW spoke with [place] and was advise that report was accepted  but sent to [place] in [place] [facility]) as [name] is not allowed to have two open CPS Cases in two different states.   CSW continues to seek information from [place] on [name]'s open case in their county. At this time. CSW has faxed over information with [name]'s consent for further information on case from [place]. CSW spoke with [facility] ID# [id number] and was advised that a message was sent over to their intake and that someone would call CSW back regarding case. CSW received Report number of [id number]. CSW still awaiting cal.    CPS will continue to provide family resources and support after discharge due to [name]'s currently having an open CPS Case in [place].       Virgie Dad Julie [name], MSW, LCSW [facility] [place] at [place] [phone]

## 2019-04-24 LAB — TYPE AND SCREEN
ABO/RH(D): O POS
Antibody Screen: POSITIVE
Unit division: 0
Unit division: 0

## 2019-04-24 LAB — BPAM RBC
Blood Product Expiration Date: 202008172359
Blood Product Expiration Date: 202008172359
Unit Type and Rh: 5100
Unit Type and Rh: 5100

## 2019-04-24 LAB — GC/CHLAMYDIA PROBE AMP (~~LOC~~) NOT AT ARMC
Chlamydia: NEGATIVE
Neisseria Gonorrhea: NEGATIVE

## 2019-05-21 ENCOUNTER — Other Ambulatory Visit: Payer: Self-pay

## 2019-05-21 ENCOUNTER — Telehealth (INDEPENDENT_AMBULATORY_CARE_PROVIDER_SITE_OTHER): Payer: Medicaid Other

## 2019-05-21 NOTE — Progress Notes (Signed)
TELEHEALTH POSTPARTUM VIRTUAL VIDEO VISIT ENCOUNTER NOTE   Provider location: Patient reports care in [place]   I connected with Julie SanderShanteka Codner on 05/21/19 at  9:35 AM EDT by MyChart Video Encounter at home and verified that I am speaking with the correct person using two identifiers.    I discussed the limitations, risks, security and privacy concerns of performing an evaluation and management service virtually and the availability of in person appointments. I also discussed with the patient that there may be a patient responsible charge related to this service. The patient expressed understanding and agreed to proceed.  Chief Complaint: Postpartum Visit  History of Present Illness: Julie [name] is a 23 y.o. African-American G2P2002 being evaluated for postpartum followup.    She is s/p SVD on 04/21/2019 at 39 weeks; she was discharged to home on 04/23/2019. Pregnancy ucomplicated. Baby is doing well.  Complains of n/a  Vaginal bleeding or discharge: Yes  Intercourse: No  Contraception: abstinence Mode of feeding infant: Breast PP depression s/s: No .  Any bowel or bladder issues: No  Pap smear: has not had pap   Review of Systems: Positive for n/a. Her 12 point review of systems is negative or as noted in the History of Present Illness.  Patient Active Problem List   Diagnosis Date Noted  . Gallstone pancreatitis 12/26/2017    Medications Julie SanderShanteka Klecka had no medications administered during this visit. Current Outpatient Medications  Medication Sig Dispense Refill  . acetaminophen (TYLENOL) 500 MG tablet Take 1,000 mg by mouth every 6 (six) hours as needed for moderate pain.    Marland Kitchen. ibuprofen (ADVIL) 600 MG tablet Take 1 tablet (600 mg total) by mouth every 6 (six) hours. (Patient not taking: Reported on 05/21/2019) 60 tablet 1  . senna-docusate (SENOKOT-S) 8.6-50 MG tablet Take 2 tablets by mouth daily. (Patient not taking: Reported on 05/21/2019) 20 tablet 0   No  current facility-administered medications for this visit.     Allergies Patient has no known allergies.  Physical Exam:  LMP 07/16/2018 (Exact Date)  General:  Alert, oriented and cooperative. Patient is in no acute distress.  Mental Status: Normal mood and affect. Normal behavior. Normal judgment and thought content.   Respiratory: Normal respiratory effort noted, no problems with respiration noted  Rest of physical exam deferred due to type of encounter  PP Depression Screening:   Edinburgh Postnatal Depression Scale Screening Tool 05/21/2019 11/14/2017 10/13/2017  I have been able to laugh and see the funny side of things. 0 0 3  I have looked forward with enjoyment to things. 0 0 2  I have blamed myself unnecessarily when things went wrong. 0 0 0  I have been anxious or worried for no good reason. 0 0 3  I have felt scared or panicky for no good reason. 0 0 0  Things have been getting on top of me. 0 0 0  I have been so unhappy that I have had difficulty sleeping. 0 0 0  I have felt sad or miserable. 0 0 0  I have been so unhappy that I have been crying. 0 0 0  The thought of harming myself has occurred to me. 0 0 0  Edinburgh Postnatal Depression Scale Total 0 0 8     Assessment:Patient is a 23 y.o. [id number] who is 4 weeks postpartum from a normal spontaneous vaginal delivery.  She is doing well.   Plan: 1. Postpartum care and examination -Patient doing well without  complaint -Declines contraception -Patient is moving to [place] and plans to establish care.  RTC for pap smear as soon as possible  I discussed the assessment and treatment plan with the patient. The patient was provided an opportunity to ask questions and all were answered. The patient agreed with the plan and demonstrated an understanding of the instructions.   The patient was advised to call back or seek an in-person evaluation/go to the ED for any concerning postpartum symptoms.  I provided 10 minutes of  face-to-face time during this encounter.   Wende Mott, [place] for [employer], [place]

## 2019-09-06 ENCOUNTER — Other Ambulatory Visit: Payer: Self-pay | Admitting: Advanced Practice Midwife

## 2019-09-06 ENCOUNTER — Inpatient Hospital Stay (HOSPITAL_COMMUNITY)
Admission: AD | Admit: 2019-09-06 | Discharge: 2019-09-06 | Disposition: A | Discharge status: Home / Self Care | Payer: Medicaid Other | Attending: Obstetrics & Gynecology | Admitting: Obstetrics & Gynecology

## 2019-09-06 ENCOUNTER — Other Ambulatory Visit: Payer: Self-pay

## 2019-09-06 DIAGNOSIS — Z3201 Encounter for pregnancy test, result positive: Secondary | ICD-10-CM

## 2019-09-06 DIAGNOSIS — Z3491 Encounter for supervision of normal pregnancy, unspecified, first trimester: Secondary | ICD-10-CM

## 2019-09-06 DIAGNOSIS — Z3490 Encounter for supervision of normal pregnancy, unspecified, unspecified trimester: Secondary | ICD-10-CM | POA: Insufficient documentation

## 2019-09-06 MED ORDER — CONCEPT OB 130-92.4-1 MG PO CAPS: 1.0000 | ORAL_CAPSULE | Freq: Every day | ORAL | 12 refills | Status: DC

## 2019-09-06 NOTE — MAU Provider Note (Signed)
First Provider Initiated Contact with Patient 09/06/19 1605      S Ms. Julie [name] is a 23 y.o. G26P2002 female at unknown gestational age who presents to MAU today requesting pregnancy confirmation.  Reports positive UPT.  Denies abdominal pain or vaginal bleeding.  Spontaneous vaginal delivery and July 2020.  States she had lochia after delivery but no period since then.  O BP 104/64   Pulse (!) 109   Resp 16   LMP 05/28/2019  Physical Exam  No apparent distress  A Positive home UPT Medical screening exam complete  Pregnancy of unknown gestational age  P Discharge from MAU in stable condition Explained that MAU does not perform pregnancy verifications and that one is not needed to start care or get pregnancy Medicaid. Rx prenatal vitamin First trimester precautions. Dating ultrasound ordered. Follow-up Information    [facility] Follow up.   Specialty: Obstetrics and Gynecology Why: start prenatal care Contact information: [place] [address] [place] [facility] Follow up.   Specialty: Obstetrics and Gynecology Why: pregnancy emergencies Contact information: [address] [id number] [place] [phone]         Allergies as of 09/06/2019   No Known Allergies     Medication List    STOP taking these medications   ibuprofen 600 MG tablet Commonly known as: ADVIL     TAKE these medications   acetaminophen 500 MG tablet Commonly known as: TYLENOL Take 1,000 mg by mouth every 6 (six) hours as needed for moderate pain.   Concept OB 130-92.4-1 MG Caps Take 1 tablet by mouth daily.   senna-docusate 8.6-50 MG tablet Commonly known as: Senokot-S Take 2 tablets by mouth daily.        Tamala Julian, [place], [place] 09/06/2019 4:29 PM

## 2019-09-06 NOTE — Discharge Instructions (Signed)
First Trimester of Pregnancy °The first trimester of pregnancy is from week 1 until the end of week 13 (months 1 through 3). A week after a sperm fertilizes an egg, the egg will implant on the wall of the uterus. This embryo will begin to develop into a baby. Genes from you and your partner will form the baby. The female genes will determine whether the baby will be a boy or a girl. At 6-8 weeks, the eyes and face will be formed, and the heartbeat can be seen on ultrasound. At the end of 12 weeks, all the baby's organs will be formed. °Now that you are pregnant, you will want to do everything you can to have a healthy baby. Two of the most important things are to get good prenatal care and to follow your health care provider's instructions. Prenatal care is all the medical care you receive before the baby's birth. This care will help prevent, find, and treat any problems during the pregnancy and childbirth. °Body changes during your first trimester °Your body goes through many changes during pregnancy. The changes vary from woman to woman. °· You may gain or lose a couple of pounds at first. °· You may feel sick to your stomach (nauseous) and you may throw up (vomit). If the vomiting is uncontrollable, call your health care provider. °· You may tire easily. °· You may develop headaches that can be relieved by medicines. All medicines should be approved by your health care provider. °· You may urinate more often. Painful urination may mean you have a bladder infection. °· You may develop heartburn as a result of your pregnancy. °· You may develop constipation because certain hormones are causing the muscles that push stool through your intestines to slow down. °· You may develop hemorrhoids or swollen veins (varicose veins). °· Your breasts may begin to grow larger and become tender. Your nipples may stick out more, and the tissue that surrounds them (areola) may become darker. °· Your gums may bleed and may be  sensitive to brushing and flossing. °· Dark spots or blotches (chloasma, mask of pregnancy) may develop on your face. This will likely fade after the baby is born. °· Your menstrual periods will stop. °· You may have a loss of appetite. °· You may develop cravings for certain kinds of food. °· You may have changes in your emotions from day to day, such as being excited to be pregnant or being concerned that something may go wrong with the pregnancy and baby. °· You may have more vivid and strange dreams. °· You may have changes in your hair. These can include thickening of your hair, rapid growth, and changes in texture. Some women also have hair loss during or after pregnancy, or hair that feels dry or thin. Your hair will most likely return to normal after your baby is born. °What to expect at prenatal visits °During a routine prenatal visit: °· You will be weighed to make sure you and the baby are growing normally. °· Your blood pressure will be taken. °· Your abdomen will be measured to track your baby's growth. °· The fetal heartbeat will be listened to between weeks 10 and 14 of your pregnancy. °· Test results from any previous visits will be discussed. °Your health care provider may ask you: °· How you are feeling. °· If you are feeling the baby move. °· If you have had any abnormal symptoms, such as leaking fluid, bleeding, severe headaches, or abdominal   cramping. °· If you are using any tobacco products, including cigarettes, chewing tobacco, and electronic cigarettes. °· If you have any questions. °Other tests that may be performed during your first trimester include: °· Blood tests to find your blood type and to check for the presence of any previous infections. The tests will also be used to check for low iron levels (anemia) and protein on red blood cells (Rh antibodies). Depending on your risk factors, or if you previously had diabetes during pregnancy, you may have tests to check for high blood sugar  that affects pregnant women (gestational diabetes). °· Urine tests to check for infections, diabetes, or protein in the urine. °· An ultrasound to confirm the proper growth and development of the baby. °· Fetal screens for spinal cord problems (spina bifida) and Down syndrome. °· HIV (human immunodeficiency virus) testing. Routine prenatal testing includes screening for HIV, unless you choose not to have this test. °· You may need other tests to make sure you and the baby are doing well. °Follow these instructions at home: °Medicines °· Follow your health care provider's instructions regarding medicine use. Specific medicines may be either safe or unsafe to take during pregnancy. °· Take a prenatal vitamin that contains at least 600 micrograms (mcg) of folic acid. °· If you develop constipation, try taking a stool softener if your health care provider approves. °Eating and drinking ° °· Eat a balanced diet that includes fresh fruits and vegetables, whole grains, good sources of protein such as meat, eggs, or tofu, and low-fat dairy. Your health care provider will help you determine the amount of weight gain that is right for you. °· Avoid raw meat and uncooked cheese. These carry germs that can cause birth defects in the baby. °· Eating four or five small meals rather than three large meals a day may help relieve nausea and vomiting. If you start to feel nauseous, eating a few soda crackers can be helpful. Drinking liquids between meals, instead of during meals, also seems to help ease nausea and vomiting. °· Limit foods that are high in fat and processed sugars, such as fried and sweet foods. °· To prevent constipation: °? Eat foods that are high in fiber, such as fresh fruits and vegetables, whole grains, and beans. °? Drink enough fluid to keep your urine clear or pale yellow. °Activity °· Exercise only as directed by your health care provider. Most women can continue their usual exercise routine during  pregnancy. Try to exercise for 30 minutes at least 5 days a week. Exercising will help you: °? Control your weight. °? Stay in shape. °? Be prepared for labor and delivery. °· Experiencing pain or cramping in the lower abdomen or lower back is a good sign that you should stop exercising. Check with your health care provider before continuing with normal exercises. °· Try to avoid standing for long periods of time. Move your legs often if you must stand in one place for a long time. °· Avoid heavy lifting. °· Wear low-heeled shoes and practice good posture. °· You may continue to have sex unless your health care provider tells you not to. °Relieving pain and discomfort °· Wear a good support bra to relieve breast tenderness. °· Take warm sitz baths to soothe any pain or discomfort caused by hemorrhoids. Use hemorrhoid cream if your health care provider approves. °· Rest with your legs elevated if you have leg cramps or low back pain. °· If you develop varicose veins in   your legs, wear support hose. Elevate your feet for 15 minutes, 3-4 times a day. Limit salt in your diet. Prenatal care  Schedule your prenatal visits by the twelfth week of pregnancy. They are usually scheduled monthly at first, then more often in the last 2 months before delivery.  Write down your questions. Take them to your prenatal visits.  Keep all your prenatal visits as told by your health care provider. This is important. Safety  Wear your seat belt at all times when driving.  Make a list of emergency phone numbers, including numbers for family, friends, the hospital, and police and fire departments. General instructions  Ask your health care provider for a referral to a local prenatal education class. Begin classes no later than the beginning of month 6 of your pregnancy.  Ask for help if you have counseling or nutritional needs during pregnancy. Your health care provider can offer advice or refer you to specialists for help  with various needs.  Do not use hot tubs, steam rooms, or saunas.  Do not douche or use tampons or scented sanitary pads.  Do not cross your legs for long periods of time.  Avoid cat litter boxes and soil used by cats. These carry germs that can cause birth defects in the baby and possibly loss of the fetus by miscarriage or stillbirth.  Avoid all smoking, herbs, alcohol, and medicines not prescribed by your health care provider. Chemicals in these products affect the formation and growth of the baby.  Do not use any products that contain nicotine or tobacco, such as cigarettes and e-cigarettes. If you need help quitting, ask your health care provider. You may receive counseling support and other resources to help you quit.  Schedule a dentist appointment. At home, brush your teeth with a soft toothbrush and be gentle when you floss. Contact a health care provider if:  You have dizziness.  You have mild pelvic cramps, pelvic pressure, or nagging pain in the abdominal area.  You have persistent nausea, vomiting, or diarrhea.  You have a bad smelling vaginal discharge.  You have pain when you urinate.  You notice increased swelling in your face, hands, legs, or ankles.  You are exposed to fifth disease or chickenpox.  You are exposed to [place] measles (rubella) and have never had it. Get help right away if:  You have a fever.  You are leaking fluid from your vagina.  You have spotting or bleeding from your vagina.  You have severe abdominal cramping or pain.  You have rapid weight gain or loss.  You vomit blood or material that looks like coffee grounds.  You develop a severe headache.  You have shortness of breath.  You have any kind of trauma, such as from a fall or a car accident. Summary  The first trimester of pregnancy is from week 1 until the end of week 13 (months 1 through 3).  Your body goes through many changes during pregnancy. The changes vary from  woman to woman.  You will have routine prenatal visits. During those visits, your health care provider will examine you, discuss any test results you may have, and talk with you about how you are feeling. This information is not intended to replace advice given to you by your health care provider. Make sure you discuss any questions you have with your health care provider. Document Released: 09/13/2001 Document Revised: 09/01/2017 Document Reviewed: 08/31/2016 Elsevier Patient Education  2020 [place]  [employer] banker, corporate for [employer] at [place]                               Phone: [phone]  [facility] for [employer] at [place]   Phone: [phone]  [facility] [employer] at [place]  Phone: [phone]  [facility] for [employer] at [employer]  Phone: [phone]  [facility] [employer] at [place]  Phone: [phone]  [facility]   Phone: [phone]  [facility] for [employer] at [place]                  Phone: [phone]  [facility]       Phone: [phone]  [facility]    Phone: [phone]   [facility]    Phone: [phone]  [facility]    Phone: [phone]  [facility]    Phone: [phone]  [facility]       Phone: [phone]   [facility]  Phone: [phone]  [facility]    Phone: [phone]  [facility]   Phone: [phone]  [facility]      Phone: [phone]  [facility]    Phone: [phone]

## 2019-09-06 NOTE — MAU Note (Signed)
.   Julie [name] is a 23 y.o. at Unknown here in MAU reporting: that she needs a pregnancy test for confirmation. Denies any pain or vaginal bleeding LMP: unsure 08/20 Onset of complaint:  Pain score: 0 Vitals:   09/06/19 1601  BP: 104/64  Pulse: (!) 109  Resp: 16     FHT: Lab orders placed from triage:

## 2019-09-09 ENCOUNTER — Telehealth: Payer: Self-pay | Admitting: General Practice

## 2019-09-11 ENCOUNTER — Ambulatory Visit (HOSPITAL_COMMUNITY)
Admission: RE | Admit: 2019-09-11 | Discharge: 2019-09-11 | Disposition: A | Discharge status: Home / Self Care | Payer: Medicaid Other | Source: Ambulatory Visit | Attending: Advanced Practice Midwife | Admitting: Advanced Practice Midwife

## 2019-09-11 ENCOUNTER — Ambulatory Visit (INDEPENDENT_AMBULATORY_CARE_PROVIDER_SITE_OTHER): Payer: Medicaid Other | Admitting: General Practice

## 2019-09-11 ENCOUNTER — Other Ambulatory Visit: Payer: Self-pay

## 2019-09-11 DIAGNOSIS — Z3491 Encounter for supervision of normal pregnancy, unspecified, first trimester: Secondary | ICD-10-CM | POA: Insufficient documentation

## 2019-09-11 DIAGNOSIS — Z712 Person consulting for explanation of examination or test findings: Secondary | ICD-10-CM

## 2019-09-11 NOTE — Progress Notes (Signed)
Patient presents to office today for dating ultrasound results. Reviewed results with Dr Hulan Fray who finds single living IUP- patient should begin prenatal care.  Informed patient of results, reviewed dating, and provided pictures. Advised she begin OB care. Patient lives near [place] office so she will contact them to begin care. Patient had no questions.  Koren Bound RN BSN 09/11/19

## 2019-09-16 ENCOUNTER — Ambulatory Visit: Payer: Medicaid Other

## 2019-09-16 ENCOUNTER — Ambulatory Visit (INDEPENDENT_AMBULATORY_CARE_PROVIDER_SITE_OTHER): Payer: Medicaid Other | Admitting: *Deleted

## 2019-09-16 ENCOUNTER — Other Ambulatory Visit: Payer: Self-pay

## 2019-09-16 VITALS — Wt 141.0 lb

## 2019-09-16 DIAGNOSIS — Z3480 Encounter for supervision of other normal pregnancy, unspecified trimester: Secondary | ICD-10-CM

## 2019-09-16 HISTORY — DX: Encounter for supervision of other normal pregnancy, unspecified trimester: Z34.80

## 2019-09-16 NOTE — Progress Notes (Signed)
  Virtual Visit via Telephone Note  I connected with Irene Pap on 09/16/19 at 10:10 AM EST by telephone and verified that I am speaking with the correct person using two identifiers.  Location: Patient: Julie [name] MRN: [id number] Provider: Derl Barrow, RN   I discussed the limitations, risks, security and privacy concerns of performing an evaluation and management service by telephone and the availability of in person appointments. I also discussed with the patient that there may be a patient responsible charge related to this service. The patient expressed understanding and agreed to proceed.   History of Present Illness: PRENATAL INTAKE SUMMARY  Ms. Arpin presents today New OB Nurse Interview.  OB History    Gravida  3   Para  2   Term  2   Preterm      AB      Living  2     SAB      TAB      Ectopic      Multiple  0   Live Births  2          I have reviewed the patient's medical, obstetrical, social, and family histories, medications, and available lab results.  SUBJECTIVE She has no unusual complaints   Observations/Objective: Initial nurse interview for history/labs (New OB)  EDD: 03/11/2020 by ultrasound GA: [redacted]w[redacted]d G3P2002 FHT: non face to face interview.  GENERAL APPEARANCE: non face to face interview.  Assessment and Plan: Normal pregnancy Prenatal care- [facility] Patient to sign up for [facility] Rx for BP cuff sent to pharmacy Continue PNV Labs/physical at next visit with midwife  Follow Up Instructions:   I discussed the assessment and treatment plan with the patient. The patient was provided an opportunity to ask questions and all were answered. The patient agreed with the plan and demonstrated an understanding of the instructions.   The patient was advised to call back or seek an in-person evaluation if the symptoms worsen or if the condition fails to improve as anticipated.  I provided 15 minutes of  non-face-to-face time during this encounter.   Derl Barrow, RN

## 2019-09-30 ENCOUNTER — Other Ambulatory Visit: Payer: Self-pay

## 2019-09-30 ENCOUNTER — Encounter (HOSPITAL_COMMUNITY): Payer: Self-pay | Admitting: Obstetrics and Gynecology

## 2019-09-30 ENCOUNTER — Inpatient Hospital Stay (HOSPITAL_COMMUNITY)
Admission: AD | Admit: 2019-09-30 | Discharge: 2019-09-30 | Disposition: A | Discharge status: Home / Self Care | Payer: Medicaid Other | Attending: Obstetrics and Gynecology | Admitting: Obstetrics and Gynecology

## 2019-09-30 DIAGNOSIS — K5900 Constipation, unspecified: Secondary | ICD-10-CM | POA: Diagnosis not present

## 2019-09-30 DIAGNOSIS — Z800 Family history of malignant neoplasm of digestive organs: Secondary | ICD-10-CM | POA: Diagnosis not present

## 2019-09-30 DIAGNOSIS — O99612 Diseases of the digestive system complicating pregnancy, second trimester: Secondary | ICD-10-CM | POA: Diagnosis not present

## 2019-09-30 DIAGNOSIS — Z833 Family history of diabetes mellitus: Secondary | ICD-10-CM | POA: Diagnosis not present

## 2019-09-30 DIAGNOSIS — Z3A17 17 weeks gestation of pregnancy: Secondary | ICD-10-CM

## 2019-09-30 DIAGNOSIS — N939 Abnormal uterine and vaginal bleeding, unspecified: Secondary | ICD-10-CM | POA: Diagnosis not present

## 2019-09-30 DIAGNOSIS — K921 Melena: Secondary | ICD-10-CM | POA: Insufficient documentation

## 2019-09-30 DIAGNOSIS — Z3A16 16 weeks gestation of pregnancy: Secondary | ICD-10-CM | POA: Diagnosis not present

## 2019-09-30 DIAGNOSIS — K625 Hemorrhage of anus and rectum: Secondary | ICD-10-CM | POA: Diagnosis present

## 2019-09-30 DIAGNOSIS — O26892 Other specified pregnancy related conditions, second trimester: Secondary | ICD-10-CM

## 2019-09-30 LAB — URINALYSIS, ROUTINE W REFLEX MICROSCOPIC
Bilirubin Urine: NEGATIVE
Glucose, UA: 150 mg/dL — AB
Hgb urine dipstick: NEGATIVE
Ketones, ur: 5 mg/dL — AB
Leukocytes,Ua: NEGATIVE
Nitrite: NEGATIVE
Protein, ur: NEGATIVE mg/dL
Specific Gravity, Urine: 1.021 (ref 1.005–1.030)
pH: 6 (ref 5.0–8.0)

## 2019-09-30 MED ORDER — POLYETHYLENE GLYCOL 3350 17 G PO PACK: 17.0000 g | PACK | Freq: Every day | ORAL | 0 refills | Status: DC

## 2019-09-30 MED ORDER — PRENATAL GUMMIES/DHA & FA 0.4-32.5 MG PO CHEW: 1.0000 | CHEWABLE_TABLET | Freq: Every day | ORAL | 1 refills | Status: DC

## 2019-09-30 MED ORDER — DOCUSATE SODIUM 100 MG PO CAPS: 100.0000 mg | ORAL_CAPSULE | Freq: Three times a day (TID) | ORAL | 0 refills | Status: DC

## 2019-09-30 MED ORDER — BISACODYL 10 MG/30ML RE ENEM: 10.0000 mg | ENEMA | Freq: Once | RECTAL | 1 refills | Status: AC

## 2019-09-30 NOTE — MAU Provider Note (Signed)
Patient Julie [name] is a 23 y.o. G3P2002  at [redacted]w[redacted]d here with complaints of abdominal pain (now resolved) and blood in her stool. She denies vaginal discharge, vaginal bleeding, pain with urination. She endorses some difficulty with having a BM; feels that she is constipated.  She had a BM an hour ago.  History     CSN: [id number]  Arrival date and time: 09/30/19 1724   First Provider Initiated Contact with Patient 09/30/19 1904      Chief Complaint  Patient presents with  . Rectal Bleeding   Rectal Bleeding  The current episode started 5 to 7 days ago. Episode frequency: it happens once a day when she has a daily BM. The problem has been resolved. The stool is described as hard. Pertinent negatives include no fever, no diarrhea, no hemorrhoids, no nausea, no vaginal bleeding and no vaginal discharge.   She notices a little pink in her stool; it is hard and she has to strain. She denies hemorroids.  OB History    Gravida  3   Para  2   Term  2   Preterm      AB      Living  2     SAB      TAB      Ectopic      Multiple  0   Live Births  2           Past Medical History:  Diagnosis Date  . GERD (gastroesophageal reflux disease)     Past Surgical History:  Procedure Laterality Date  . CHOLECYSTECTOMY N/A 01/09/2018   Procedure: LAPAROSCOPIC CHOLECYSTECTOMY WITH INTRAOPERATIVE CHOLANGIOGRAM;  Surgeon: Claud Kelp, MD;  Location: [facility];  Service: General;  Laterality: N/A;  . NO PAST SURGERIES      Family History  Problem Relation Age of Onset  . Diabetes Mother   . Pancreatic cancer Father   . Cancer Maternal Grandfather     Social History   Tobacco Use  . Smoking status: Never Smoker  . Smokeless tobacco: Never Used  Substance Use Topics  . Alcohol use: No  . Drug use: No    Allergies: No Known Allergies  Medications Prior to Admission  Medication Sig Dispense Refill Last Dose  . acetaminophen (TYLENOL) 500 MG  tablet Take 1,000 mg by mouth every 6 (six) hours as needed for moderate pain.   Past Month at Unknown time  . Prenat w/o A Vit-FeFum-FePo-FA (CONCEPT OB) 130-92.4-1 MG CAPS Take 1 tablet by mouth daily. 30 capsule 12     Review of Systems  Constitutional: Negative for fever.  Gastrointestinal: Positive for hematochezia. Negative for diarrhea, hemorrhoids and nausea.  Genitourinary: Negative for vaginal bleeding and vaginal discharge.   Physical Exam   Blood pressure 109/67, pulse (!) 106, temperature 98.8 F (37.1 C), temperature source Oral, resp. rate 20, weight 65.1 kg, last menstrual period 05/28/2019, SpO2 97 %, unknown if currently breastfeeding.  Physical Exam  Constitutional: She appears well-developed.  HENT:  Head: Normocephalic.  GI: Soft.  Genitourinary:    Genitourinary Comments: NEFG; two small external hemorroids noted. No other lesions or abnormal findings.    Musculoskeletal:        General: Normal range of motion.     Cervical back: Normal range of motion.  Neurological: She is alert.    MAU Course  Procedures  MDM -164 HR by Doppler -uneventful MAU course; low index of suspicion of emergency as patient VSS and  her complaint is not life-threatening.  Assessment and Plan   1. Constipation, unspecified constipation type    2. Patient instructed on how to use enema at home, as well adding colace, miralax and fiber to her daily regimen.   3. Keep OB appt on 12/30.   4. Return to MAU if symptoms worsen or change.   Mervyn Skeeters Elie Gragert 09/30/2019, 7:06 PM

## 2019-09-30 NOTE — Discharge Instructions (Signed)

## 2019-09-30 NOTE — MAU Note (Addendum)
Pt states she has been seeing blood in her poop for one week.   Pt reports only seeing the blood when she poops.   Pt reports it could be from constipation she states she has been constipated for one week. Pt reports last having a BM 1 hour ago.   Pt reports pain in her right side that started this morning.  Pt reports a headache that comes and goes since she has been pregnant. Denies headache being present now in MAU.

## 2019-10-02 ENCOUNTER — Encounter: Payer: Self-pay | Admitting: Advanced Practice Midwife

## 2019-10-02 ENCOUNTER — Encounter: Payer: Self-pay | Admitting: General Practice

## 2019-10-02 ENCOUNTER — Other Ambulatory Visit: Payer: Self-pay

## 2019-10-02 ENCOUNTER — Other Ambulatory Visit (HOSPITAL_COMMUNITY)
Admission: RE | Admit: 2019-10-02 | Discharge: 2019-10-02 | Disposition: A | Discharge status: Home / Self Care | Payer: Medicaid Other | Source: Ambulatory Visit | Attending: Advanced Practice Midwife | Admitting: Advanced Practice Midwife

## 2019-10-02 ENCOUNTER — Ambulatory Visit (INDEPENDENT_AMBULATORY_CARE_PROVIDER_SITE_OTHER): Payer: Medicaid Other | Admitting: Advanced Practice Midwife

## 2019-10-02 VITALS — BP 122/67 | HR 102 | Temp 98.4°F | Wt 142.0 lb

## 2019-10-02 DIAGNOSIS — Z1389 Encounter for screening for other disorder: Secondary | ICD-10-CM

## 2019-10-02 DIAGNOSIS — Z124 Encounter for screening for malignant neoplasm of cervix: Secondary | ICD-10-CM | POA: Diagnosis present

## 2019-10-02 DIAGNOSIS — Z3480 Encounter for supervision of other normal pregnancy, unspecified trimester: Secondary | ICD-10-CM | POA: Diagnosis present

## 2019-10-02 DIAGNOSIS — Z3A17 17 weeks gestation of pregnancy: Secondary | ICD-10-CM

## 2019-10-02 DIAGNOSIS — Z3482 Encounter for supervision of other normal pregnancy, second trimester: Secondary | ICD-10-CM

## 2019-10-02 NOTE — Progress Notes (Signed)
Subjective:   Julie [name] is a 23 y.o. G3P2002 at [redacted]w[redacted]d by early ultrasound being seen today for her first obstetrical visit.  Her obstetrical history is significant for none . Patient unsure about intention to breast feed. Pregnancy history fully reviewed.  Patient reports constipation, last BM about 2 days ago.  She was seen in MAU, and started on Miralax. She does not feel this has had time to work yet.   HISTORY: OB History  Gravida Para Term Preterm AB Living  3 2 2  0 0 2  SAB TAB Ectopic Multiple Live Births  0 0 0 0 2    # Outcome Date GA Lbr Len/2nd Weight Sex Delivery Anes PTL Lv  3 Current           2 Term 04/21/19 [redacted]w[redacted]d 11:26 / 00:25 7 lb 0.9 oz (3.201 kg) F Vag-Spont EPI  LIV     Birth Comments: WNL     Name: DVORA, BUITRON     Apgar1: 9  Apgar5: 9  1 Term 10/12/17 [redacted]w[redacted]d / 01:06 6 lb 1.4 oz (2.761 kg) F Vag-Spont EPI  LIV     Birth Comments: WNL     Name: MARION, SEESE     Apgar1: 9  Apgar5: 9    Last pap smear was done NA, age  and was NA, age   Past Medical History:  Diagnosis Date  . GERD (gastroesophageal reflux disease)    Past Surgical History:  Procedure Laterality Date  . CHOLECYSTECTOMY N/A 01/09/2018   Procedure: LAPAROSCOPIC CHOLECYSTECTOMY WITH INTRAOPERATIVE CHOLANGIOGRAM;  Surgeon: Fanny Skates, MD;  Location: [facility];  Service: General;  Laterality: N/A;  . NO PAST SURGERIES     Family History  Problem Relation Age of Onset  . Diabetes Mother   . Pancreatic cancer Father   . Cancer Maternal Grandfather    Social History   Tobacco Use  . Smoking status: Never Smoker  . Smokeless tobacco: Never Used  Substance Use Topics  . Alcohol use: No  . Drug use: No   No Known Allergies Current Outpatient Medications on File Prior to Visit  Medication Sig Dispense Refill  . acetaminophen (TYLENOL) 500 MG tablet Take 1,000 mg by mouth every 6 (six) hours as needed for moderate pain.    Marland Kitchen docusate sodium (COLACE)  100 MG capsule Take 1 capsule (100 mg total) by mouth 3 (three) times daily. 60 capsule 0  . polyethylene glycol (MIRALAX / GLYCOLAX) 17 g packet Take 17 g by mouth daily. 14 each 0  . Prenatal MV-Min-FA-Omega-3 (PRENATAL GUMMIES/DHA & FA) 0.4-32.5 MG CHEW Chew 1 Dose by mouth daily. (Patient not taking: Reported on 10/02/2019) 30 tablet 1   No current facility-administered medications on file prior to visit.    Review of Systems Pertinent items noted in HPI and remainder of comprehensive ROS otherwise negative.  Exam   Vitals:   10/02/19 1450  BP: 122/67  Pulse: (!) 102  Temp: 98.4 F (36.9 C)  Weight: 142 lb (64.4 kg)   Fetal Heart Rate (bpm): 154  Physical Exam  Constitutional: She is oriented to person, place, and time and well-developed, well-nourished, and in no distress. No distress.  Cardiovascular: Normal rate.  Pulmonary/Chest: Effort normal.  Abdominal: Soft. There is no abdominal tenderness. There is no rebound.  Genitourinary:    Genitourinary Comments:  External: no lesion Vagina: small amount of white discharge Cervix: pink, smooth, no CMT, some contact bleeding with pap  Uterus: AGA  Neurological: She is alert and oriented to person, place, and time.  Skin: Skin is warm and dry.  Psychiatric: Affect normal.  Nursing note and vitals reviewed.  Assessment:   Pregnancy: [id number] Patient Active Problem List   Diagnosis Date Noted  . Supervision of other normal pregnancy, antepartum 09/16/2019  . Gallstone pancreatitis 12/26/2017     Plan:  1. Supervision of other normal pregnancy, antepartum - ob urine culture - Obstetric Panel, Including HIV - Genetic Screening - HgB A1c - Glucose - Cervicovaginal ancillary only( [place]) - Cytology - PAP( [place]) - [place] MFM OB COMP + 14 WK; Future - AFP, Serum, Open Spina Bifida  2. Pap smear for cervical cancer screening - Cytology - PAP( [place])   Initial labs drawn. Continue prenatal  vitamins. Genetic Screening discussed, AFP and panorama  and NIPS: requested. Ultrasound discussed; fetal anatomic survey: requested. Problem list reviewed and updated. The nature of [facility] with multiple MDs and other Advanced Practice Providers was explained to patient; also emphasized that residents, students are part of our team. Routine obstetric precautions reviewed. 50% of 45 min visit spent in counseling and coordination of care. No follow-ups on file.   Thressa Sheller DNP, CNM  10/02/19  2:55 PM

## 2019-10-03 ENCOUNTER — Encounter: Payer: Self-pay | Admitting: General Practice

## 2019-10-03 LAB — CERVICOVAGINAL ANCILLARY ONLY
Bacterial Vaginitis (gardnerella): NEGATIVE
Candida Glabrata: NEGATIVE
Candida Vaginitis: NEGATIVE
Chlamydia: NEGATIVE
Comment: NEGATIVE
Comment: NEGATIVE
Comment: NEGATIVE
Comment: NEGATIVE
Comment: NEGATIVE
Comment: NORMAL
Neisseria Gonorrhea: NEGATIVE
Trichomonas: NEGATIVE

## 2019-10-03 LAB — HEMOGLOBIN A1C
Est. average glucose Bld gHb Est-mCnc: 100 mg/dL
Hgb A1c MFr Bld: 5.1 % (ref 4.8–5.6)

## 2019-10-03 LAB — OBSTETRIC PANEL, INCLUDING HIV
Basophils Absolute: 0 10*3/uL (ref 0.0–0.2)
Basos: 0 %
EOS (ABSOLUTE): 0 10*3/uL (ref 0.0–0.4)
Eos: 0 %
HIV Screen 4th Generation wRfx: NONREACTIVE
Hematocrit: 33.8 % — ABNORMAL LOW (ref 34.0–46.6)
Hemoglobin: 11.3 g/dL (ref 11.1–15.9)
Hepatitis B Surface Ag: NEGATIVE
Immature Grans (Abs): 0 10*3/uL (ref 0.0–0.1)
Immature Granulocytes: 1 %
Lymphocytes Absolute: 2 10*3/uL (ref 0.7–3.1)
Lymphs: 33 %
MCH: 29.2 pg (ref 26.6–33.0)
MCHC: 33.4 g/dL (ref 31.5–35.7)
MCV: 87 fL (ref 79–97)
Monocytes Absolute: 0.5 10*3/uL (ref 0.1–0.9)
Monocytes: 9 %
Neutrophils Absolute: 3.5 10*3/uL (ref 1.4–7.0)
Neutrophils: 57 %
Platelets: 275 10*3/uL (ref 150–450)
RBC: 3.87 x10E6/uL (ref 3.77–5.28)
RDW: 13.6 % (ref 11.7–15.4)
RPR Ser Ql: NONREACTIVE
Rh Factor: POSITIVE
Rubella Antibodies, IGG: 4.54 index (ref >0.99)
WBC: 6 10*3/uL (ref 3.4–10.8)

## 2019-10-03 LAB — AB SCR+ANTIBODY ID: Antibody Screen: POSITIVE — AB

## 2019-10-03 LAB — CYTOLOGY - PAP: Diagnosis: NEGATIVE

## 2019-10-04 LAB — AFP, SERUM, OPEN SPINA BIFIDA
AFP MoM: 1.02
AFP Value: 44.1 ng/mL
Gest. Age on Collection Date: 17 weeks
Maternal Age At EDD: 21.8 yr
OSBR Risk 1 IN: 10000
Test Results:: NEGATIVE
Weight: 142 [lb_av]

## 2019-10-04 LAB — GLUCOSE, RANDOM: Glucose: 78 mg/dL (ref 65–99)

## 2019-10-04 NOTE — L&D Delivery Note (Signed)
OB/GYN Faculty Practice Delivery Note  Julie [name] is a 24 y.o. [id number] s/p VD at [redacted]w[redacted]d. She was admitted for IOL for BPP 4/10.   ROM: 2h 97m with clear fluid GBS Status: Negative/-- (06/30 0000) Maximum Maternal Temperature: 98.41F  Labor Progress: . Initial SVE: 2.5/40/-2. Patient received Pitocin, AROM and epidural. She then progressed to complete.   Delivery Date/Time: 5/27 @ [phone] Delivery: Called to room and patient was complete and pushing. Head delivered ROA. No nuchal cord present. Shoulder and body delivered in usual fashion. Infant with spontaneous cry, placed on mother's abdomen, dried and stimulated. Cord clamped x 2 after 1-minute delay, and cut by FOB. Cord blood drawn. Placenta delivered spontaneously with gentle cord traction. Fundus firm with massage and Pitocin. Labia, perineum, vagina, and cervix inspected inspected with small first degree laceration which was repaired with 3-0 Vicryl in a standard fashion.  Baby Weight: pending  Placenta: Sent to L&D Complications: None Lacerations: first degree EBL: 150 mL Analgesia: Epidural   Infant:  APGAR (1 MIN): 8   APGAR (5 MINS): 9   APGAR (10 MINS):     Jerilynn Birkenhead, MD [facility] Family Medicine Fellow, [facility] for [facility], [facility] 02/27/2020, 4:14 AM

## 2019-10-07 LAB — URINE CULTURE, OB REFLEX

## 2019-10-07 LAB — CULTURE, OB URINE

## 2019-10-08 MED ORDER — CEPHALEXIN 500 MG PO CAPS: 500.0000 mg | ORAL_CAPSULE | Freq: Three times a day (TID) | ORAL | 0 refills | Status: DC

## 2019-10-08 NOTE — Addendum Note (Signed)
Addended by: Thressa Sheller D on: 10/08/2019 11:29 AM   Modules accepted: Orders

## 2019-10-10 ENCOUNTER — Encounter: Payer: Self-pay | Admitting: General Practice

## 2019-10-11 NOTE — Progress Notes (Signed)
I have reviewed this chart and agree with the RN/CMA assessment and management.    Norie Latendresse C Ayelen Sciortino, MD, FACOG Attending Physician, Faculty Practice [facility]  

## 2019-10-23 ENCOUNTER — Ambulatory Visit (HOSPITAL_COMMUNITY)
Admission: RE | Admit: 2019-10-23 | Discharge: 2019-10-23 | Disposition: A | Discharge status: Home / Self Care | Payer: Medicaid Other | Source: Ambulatory Visit | Attending: Advanced Practice Midwife | Admitting: Advanced Practice Midwife

## 2019-10-23 ENCOUNTER — Other Ambulatory Visit: Payer: Self-pay

## 2019-10-23 DIAGNOSIS — Z363 Encounter for antenatal screening for malformations: Secondary | ICD-10-CM | POA: Diagnosis not present

## 2019-10-23 DIAGNOSIS — Z3A20 20 weeks gestation of pregnancy: Secondary | ICD-10-CM

## 2019-10-23 DIAGNOSIS — Z3480 Encounter for supervision of other normal pregnancy, unspecified trimester: Secondary | ICD-10-CM | POA: Diagnosis present

## 2019-10-23 DIAGNOSIS — O09892 Supervision of other high risk pregnancies, second trimester: Secondary | ICD-10-CM | POA: Diagnosis not present

## 2019-10-30 ENCOUNTER — Encounter: Payer: Self-pay | Admitting: Advanced Practice Midwife

## 2019-10-30 ENCOUNTER — Telehealth (INDEPENDENT_AMBULATORY_CARE_PROVIDER_SITE_OTHER): Payer: Medicaid Other | Admitting: Advanced Practice Midwife

## 2019-10-30 ENCOUNTER — Other Ambulatory Visit: Payer: Self-pay

## 2019-10-30 DIAGNOSIS — Z3A21 21 weeks gestation of pregnancy: Secondary | ICD-10-CM | POA: Diagnosis not present

## 2019-10-30 DIAGNOSIS — Z3482 Encounter for supervision of other normal pregnancy, second trimester: Secondary | ICD-10-CM | POA: Diagnosis not present

## 2019-10-30 DIAGNOSIS — Z3480 Encounter for supervision of other normal pregnancy, unspecified trimester: Secondary | ICD-10-CM

## 2019-10-30 MED ORDER — GOJJI WEIGHT SCALE MISC: 1.0000 | Freq: Every day | 0 refills | Status: DC | PRN

## 2019-10-30 NOTE — Progress Notes (Signed)
   TELEHEALTH VIRTUAL OBSTETRICS VISIT ENCOUNTER NOTE  I connected with Julie [name] on 10/30/19 at  3:10 PM EST by telephone at home and verified that I am speaking with the correct person using two identifiers.   I discussed the limitations, risks, security and privacy concerns of performing an evaluation and management service by telephone and the availability of in person appointments. I also discussed with the patient that there may be a patient responsible charge related to this service. The patient expressed understanding and agreed to proceed.  Subjective:  Julie [name] is a 24 y.o. G3P2002 at [redacted]w[redacted]d being followed for ongoing prenatal care.  She is currently monitored for the following issues for this low-risk pregnancy and has Gallstone pancreatitis and Supervision of other normal pregnancy, antepartum on their problem list.  Patient reports no complaints. Reports fetal movement. Denies any contractions, bleeding or leaking of fluid.   The following portions of the patient's history were reviewed and updated as appropriate: allergies, current medications, past family history, past medical history, past social history, past surgical history and problem list.   Objective:   General:  Alert, oriented and cooperative.   Mental Status: Normal mood and affect perceived. Normal judgment and thought content.  Rest of physical exam deferred due to type of encounter  Assessment and Plan:  Pregnancy: G3P2002 at [redacted]w[redacted]d 1. Supervision of other normal pregnancy, antepartum - Misc. Devices (GOJJI WEIGHT SCALE) MISC; 1 Device by Does not apply route daily as needed. To weight self daily as needed at home. ICD-10 code: O27.90  Dispense: 1 each; Refill: 0  Preterm labor symptoms and general obstetric precautions including but not limited to vaginal bleeding, contractions, leaking of fluid and fetal movement were reviewed in detail with the patient.  I discussed the assessment and treatment plan  with the patient. The patient was provided an opportunity to ask questions and all were answered. The patient agreed with the plan and demonstrated an understanding of the instructions. The patient was advised to call back or seek an in-person office evaluation/go to MAU at [facility] for any urgent or concerning symptoms. Please refer to After Visit Summary for other counseling recommendations.   I provided 12 minutes of non-face-to-face time during this encounter.  Return in about 4 weeks (around 11/27/2019) for virtual visit .  Future Appointments  Date Time Provider Department Center  10/30/2019  3:10 PM Armando Reichert, CNM [facility] None  12/06/2019  8:30 AM Gerrit Heck, CNM [facility] None   Thressa Sheller DNP, CNM  10/30/19  3:05 PM  Center for [employer], [facility]

## 2019-10-31 ENCOUNTER — Encounter: Payer: Self-pay | Admitting: Advanced Practice Midwife

## 2019-11-06 ENCOUNTER — Encounter: Payer: Self-pay | Admitting: Advanced Practice Midwife

## 2019-11-12 ENCOUNTER — Encounter: Payer: Self-pay | Admitting: Advanced Practice Midwife

## 2019-11-26 ENCOUNTER — Encounter: Payer: Self-pay | Admitting: Advanced Practice Midwife

## 2019-12-02 ENCOUNTER — Encounter: Payer: Self-pay | Admitting: Advanced Practice Midwife

## 2019-12-05 ENCOUNTER — Encounter: Payer: Self-pay | Admitting: Advanced Practice Midwife

## 2019-12-07 ENCOUNTER — Inpatient Hospital Stay (HOSPITAL_COMMUNITY)
Admission: AD | Admit: 2019-12-07 | Discharge: 2019-12-07 | Disposition: A | Discharge status: Home / Self Care | Payer: Medicaid Other | Attending: Family Medicine | Admitting: Family Medicine

## 2019-12-07 ENCOUNTER — Encounter (HOSPITAL_COMMUNITY): Payer: Self-pay | Admitting: Family Medicine

## 2019-12-07 ENCOUNTER — Other Ambulatory Visit: Payer: Self-pay

## 2019-12-07 DIAGNOSIS — R102 Pelvic and perineal pain: Secondary | ICD-10-CM | POA: Diagnosis not present

## 2019-12-07 DIAGNOSIS — N898 Other specified noninflammatory disorders of vagina: Secondary | ICD-10-CM

## 2019-12-07 DIAGNOSIS — O99820 Streptococcus B carrier state complicating pregnancy: Secondary | ICD-10-CM | POA: Insufficient documentation

## 2019-12-07 DIAGNOSIS — Z3A26 26 weeks gestation of pregnancy: Secondary | ICD-10-CM

## 2019-12-07 DIAGNOSIS — O26899 Other specified pregnancy related conditions, unspecified trimester: Secondary | ICD-10-CM

## 2019-12-07 DIAGNOSIS — O26892 Other specified pregnancy related conditions, second trimester: Secondary | ICD-10-CM

## 2019-12-07 DIAGNOSIS — R8271 Bacteriuria: Secondary | ICD-10-CM | POA: Diagnosis not present

## 2019-12-07 LAB — URINALYSIS, ROUTINE W REFLEX MICROSCOPIC
Bilirubin Urine: NEGATIVE
Glucose, UA: 50 mg/dL — AB
Hgb urine dipstick: NEGATIVE
Ketones, ur: NEGATIVE mg/dL
Leukocytes,Ua: NEGATIVE
Nitrite: NEGATIVE
Protein, ur: NEGATIVE mg/dL
Specific Gravity, Urine: 1.017 (ref 1.005–1.030)
pH: 6 (ref 5.0–8.0)

## 2019-12-07 LAB — WET PREP, GENITAL
Clue Cells Wet Prep HPF POC: NONE SEEN
Sperm: NONE SEEN
Trich, Wet Prep: NONE SEEN
Yeast Wet Prep HPF POC: NONE SEEN

## 2019-12-07 MED ORDER — AMOXICILLIN 875 MG PO TABS: 875.0000 mg | ORAL_TABLET | Freq: Two times a day (BID) | ORAL | 0 refills | Status: AC

## 2019-12-07 MED ORDER — POLYETHYLENE GLYCOL 3350 17 G PO PACK: 17.0000 g | PACK | Freq: Every day | ORAL | 3 refills | Status: DC

## 2019-12-07 MED ORDER — CYCLOBENZAPRINE HCL 10 MG PO TABS: 10.0000 mg | ORAL_TABLET | Freq: Every evening | ORAL | 0 refills | Status: DC | PRN

## 2019-12-07 NOTE — MAU Provider Note (Signed)
Chief Complaint:  Vaginal Discharge and Abdominal Pain  First Provider Initiated Contact with Patient 12/07/19 1145      HPI: Julie [name] is a 24 y.o. G3P2002 at [redacted]w[redacted]d who presents to maternity admissions reporting vaginal discharge. Reports this started about 2-3 days ago after having intercourse. Discharge is yellow or clear in color; thin consistency. Denies odor. Denies vaginal bleeding. Only at night she will have pain bilaterally in lower pelvis that is constant through the night. She has not tried anything for the pain. This pain has only been present for the past 2-3 days. Denies diarrhea. Does reports some constipation and that she was prescribed a medicine but it was not at the pharmacy. She was prescribed Miralax. Last bowel movement was this morning and soft. Denies concern for STD's. Occasional headaches but none currently. Also reports that she was told she had a UTI back in December but went to pick up prescription and again, no prescription there. Denies any urinary symptoms then or now.  She reports good fetal movement, denies LOF, vaginal itching/burning, urinary symptoms, dizziness, n/v, or fever/chills.    Past Medical History: Past Medical History:  Diagnosis Date  . GERD (gastroesophageal reflux disease)     Past obstetric history: OB History  Gravida Para Term Preterm AB Living  3 2 2     2   SAB TAB Ectopic Multiple Live Births        0 2    # Outcome Date GA Lbr Len/2nd Weight Sex Delivery Anes PTL Lv  3 Current           2 Term 04/21/19 [redacted]w[redacted]d 11:26 / 00:25 3201 g F Vag-Spont EPI  LIV     Birth Comments: WNL  1 Term 10/12/17 [redacted]w[redacted]d / 01:06 2761 g F Vag-Spont EPI  LIV     Birth Comments: WNL    Past Surgical History: Past Surgical History:  Procedure Laterality Date  . CHOLECYSTECTOMY N/A 01/09/2018   Procedure: LAPAROSCOPIC CHOLECYSTECTOMY WITH INTRAOPERATIVE CHOLANGIOGRAM;  Surgeon: 03/11/2018, MD;  Location: [facility];  Service:  General;  Laterality: N/A;  . NO PAST SURGERIES      Family History: Family History  Problem Relation Age of Onset  . Diabetes Mother   . Pancreatic cancer Father   . Cancer Maternal Grandfather     Social History: Social History   Tobacco Use  . Smoking status: Never Smoker  . Smokeless tobacco: Never Used  Substance Use Topics  . Alcohol use: No  . Drug use: No    Allergies: No Known Allergies  Meds:  Medications Prior to Admission  Medication Sig Dispense Refill Last Dose  . acetaminophen (TYLENOL) 500 MG tablet Take 1,000 mg by mouth every 6 (six) hours as needed for moderate pain.     . cephALEXin (KEFLEX) 500 MG capsule Take 1 capsule (500 mg total) by mouth 3 (three) times daily. (Patient not taking: Reported on 10/30/2019) 21 capsule 0   . docusate sodium (COLACE) 100 MG capsule Take 1 capsule (100 mg total) by mouth 3 (three) times daily. (Patient not taking: Reported on 10/30/2019) 60 capsule 0   . Misc. Devices (GOJJI WEIGHT SCALE) MISC 1 Device by Does not apply route daily as needed. To weight self daily as needed at home. ICD-10 code: O09.90 1 each 0   . Prenatal MV-Min-FA-Omega-3 (PRENATAL GUMMIES/DHA & FA) 0.4-32.5 MG CHEW Chew 1 Dose by mouth daily. (Patient not taking: Reported on 10/02/2019) 30 tablet 1   . [  DISCONTINUED] polyethylene glycol (MIRALAX / GLYCOLAX) 17 g packet Take 17 g by mouth daily. (Patient not taking: Reported on 10/30/2019) 14 each 0     ROS:  Review of Systems All other systems negative unless noted above in HPI.   I have reviewed patient's Past Medical Hx, Surgical Hx, Family Hx, Social Hx, medications and allergies.   Physical Exam   Patient Vitals for the past 24 hrs:  BP Temp Temp src Pulse Resp SpO2 Weight  12/07/19 1200 -- -- -- (!) 110 -- -- --  12/07/19 1121 (!) 108/52 99.2 F (37.3 C) Oral (!) 118 17 100 % 67.9 kg   Constitutional: Well-developed, well-nourished female in no acute distress.  Cardiovascular: Mild  tachycardia Respiratory: normal effort GI: Abd soft, non-tender, gravid appropriate for gestational age.  MS: Extremities nontender, no edema, normal ROM Neurologic: Alert and oriented x 4.  GU: Neg CVAT. PELVIC EXAM: Cervix pink, visually closed, without lesion, minimal thin white creamy discharge, vaginal walls and external genitalia normal Bimanual exam: Cervix 0/long/high, firm, anterior, neg CMT, uterus nontender, nonenlarged, adnexa without tenderness, enlargement, or mass  Dilation: Closed Effacement (%): 0 Cervical Position: Posterior Station: Ballotable Exam by:: Zarielle Cea   FHT:  Baseline 150, moderate variability, 10x10 accelerations present, no decelerations Contractions: None   Labs: Results for orders placed or performed during the hospital encounter of 12/07/19 (from the past 24 hour(s))  Urinalysis, Routine w reflex microscopic     Status: Abnormal   Collection Time: 12/07/19 11:24 AM  Result Value Ref Range   Color, Urine YELLOW YELLOW   APPearance CLEAR CLEAR   Specific Gravity, Urine 1.017 1.005 - 1.030   pH 6.0 5.0 - 8.0   Glucose, UA 50 (A) NEGATIVE mg/dL   Hgb urine dipstick NEGATIVE NEGATIVE   Bilirubin Urine NEGATIVE NEGATIVE   Ketones, ur NEGATIVE NEGATIVE mg/dL   Protein, ur NEGATIVE NEGATIVE mg/dL   Nitrite NEGATIVE NEGATIVE   Leukocytes,Ua NEGATIVE NEGATIVE  Wet prep, genital     Status: Abnormal   Collection Time: 12/07/19 11:59 AM   Specimen: Genital  Result Value Ref Range   Yeast Wet Prep HPF POC NONE SEEN NONE SEEN   Trich, Wet Prep NONE SEEN NONE SEEN   Clue Cells Wet Prep HPF POC NONE SEEN NONE SEEN   WBC, Wet Prep HPF POC FEW (A) NONE SEEN   Sperm NONE SEEN    O/Positive/-- (12/30 1522)  Imaging:  No results found.  MAU Course/MDM: Orders Placed This Encounter  Procedures  . Wet prep, genital  . Urinalysis, Routine w reflex microscopic  . Discharge patient    Meds ordered this encounter  Medications  . polyethylene glycol  (MIRALAX / GLYCOLAX) 17 g packet    Sig: Take 17 g by mouth daily.    Dispense:  14 each    Refill:  3  . amoxicillin (AMOXIL) 875 MG tablet    Sig: Take 1 tablet (875 mg total) by mouth 2 (two) times daily for 5 days.    Dispense:  10 tablet    Refill:  0  . cyclobenzaprine (FLEXERIL) 10 MG tablet    Sig: Take 1 tablet (10 mg total) by mouth at bedtime as needed for muscle spasms.    Dispense:  30 tablet    Refill:  0     NST reviewed: AGA as defined above without contractions. Patient with vaginal discharge. GC/Chlamydia drawn. Wet prep without evidence of infeciton.  UA without evidence of infection.  Patient  previously prescribed Keflex for GBS in urine and never picked up; will prescribe Amoxicillin. Patient needs urine culture at next visit.  Will trial Flexeril at night for pelvic cramping.  Pt discharged with strict return precautions.  Assessment: 1. Vaginal discharge during pregnancy in second trimester   2. Pelvic cramping in antepartum period   3. GBS bacteriuria     Plan: Discharge home Future Appointments  Date Time Provider [place]  12/19/2019  8:30 AM Laury Deep, CNM [facility] None    Allergies as of 12/07/2019   No Known Allergies     Medication List    STOP taking these medications   cephALEXin 500 MG capsule Commonly known as: KEFLEX   docusate sodium 100 MG capsule Commonly known as: COLACE     TAKE these medications   acetaminophen 500 MG tablet Commonly known as: TYLENOL Take 1,000 mg by mouth every 6 (six) hours as needed for moderate pain.   amoxicillin 875 MG tablet Commonly known as: AMOXIL Take 1 tablet (875 mg total) by mouth 2 (two) times daily for 5 days.   cyclobenzaprine 10 MG tablet Commonly known as: FLEXERIL Take 1 tablet (10 mg total) by mouth at bedtime as needed for muscle spasms.   Gojji Weight Scale Misc 1 Device by Does not apply route daily as needed. To weight self daily as needed at home. ICD-10 code:  O09.90   polyethylene glycol 17 g packet Commonly known as: MIRALAX / GLYCOLAX Take 17 g by mouth daily.   Prenatal Gummies/DHA & FA 0.4-32.5 MG Chew Chew 1 Dose by mouth daily.       Barrington Ellison, MD [facility] Family Medicine Fellow, [facility] for [facility], [facility] 12/07/2019 12:32 PM

## 2019-12-07 NOTE — MAU Note (Signed)
Julie [name] is a 24 y.o. at [redacted]w[redacted]d here in MAU reporting:  +vaginal discharge. Clear with yellow tinge at times.  Denies odor.  Not having to wear a pad.   +lower abdominal cramping. Intermittent. Only at night  "I've been having headaches." none now  Last intercourse 2 days ago.   Onset of complaint: 2 days Pain score: 9/10. Has not tried anything for the pain. Vitals:   12/07/19 1121  BP: (!) 108/52  Pulse: (!) 118  Resp: 17  Temp: 99.2 F (37.3 C)  SpO2: 100%     +FM Lab orders placed from triage: ua

## 2019-12-09 LAB — GC/CHLAMYDIA PROBE AMP (~~LOC~~) NOT AT ARMC
Chlamydia: NEGATIVE
Comment: NEGATIVE
Comment: NORMAL
Neisseria Gonorrhea: NEGATIVE

## 2019-12-18 DIAGNOSIS — R8271 Bacteriuria: Secondary | ICD-10-CM | POA: Insufficient documentation

## 2019-12-18 NOTE — Patient Instructions (Addendum)
Fetal Movement Counts Patient Name: ________________________________________________ Patient Due Date: ____________________ What is a fetal movement count?  A fetal movement count is the number of times that you feel your baby move during a certain amount of time. This may also be called a fetal kick count. A fetal movement count is recommended for every pregnant woman. You may be asked to start counting fetal movements as early as week 28 of your pregnancy. Pay attention to when your baby is most active. You may notice your baby's sleep and wake cycles. You may also notice things that make your baby move more. You should do a fetal movement count:  When your baby is normally most active.  At the same time each day. A good time to count movements is while you are resting, after having something to eat and drink. How do I count fetal movements? 1. Find a quiet, comfortable area. Sit, or lie down on your side. 2. Write down the date, the start time and stop time, and the number of movements that you felt between those two times. Take this information with you to your health care visits. 3. Write down your start time when you feel the first movement. 4. Count kicks, flutters, swishes, rolls, and jabs. You should feel at least 10 movements. 5. You may stop counting after you have felt 10 movements, or if you have been counting for 2 hours. Write down the stop time. 6. If you do not feel 10 movements in 2 hours, contact your health care provider for further instructions. Your health care provider may want to do additional tests to assess your baby's well-being. Contact a health care provider if:  You feel fewer than 10 movements in 2 hours.  Your baby is not moving like he or she usually does. Date: ____________ Start time: ____________ Stop time: ____________ Movements: ____________ Date: ____________ Start time: ____________ Stop time: ____________ Movements: ____________ Date:  ____________ Start time: ____________ Stop time: ____________ Movements: ____________ Date: ____________ Start time: ____________ Stop time: ____________ Movements: ____________ Date: ____________ Start time: ____________ Stop time: ____________ Movements: ____________ Date: ____________ Start time: ____________ Stop time: ____________ Movements: ____________ Date: ____________ Start time: ____________ Stop time: ____________ Movements: ____________ Date: ____________ Start time: ____________ Stop time: ____________ Movements: ____________ Date: ____________ Start time: ____________ Stop time: ____________ Movements: ____________ This information is not intended to replace advice given to you by your health care provider. Make sure you discuss any questions you have with your health care provider. Document Revised: 05/09/2019 Document Reviewed: 05/09/2019 Elsevier Patient Education  2020 Elsevier Inc[employer] Iron-Rich Diet  Iron is a mineral that helps your body to produce hemoglobin. Hemoglobin is a protein in red blood cells that carries oxygen to your body's tissues. Eating too little iron may cause you to feel weak and tired, and it can increase your risk of infection. Iron is naturally found in many foods, and many foods have iron added to them (iron-fortified foods). You may need to follow an iron-rich diet if you do not have enough iron in your body due to certain medical conditions. The amount of iron that you need each day depends on your age, your sex, and any medical conditions you have. Follow instructions from your health care provider or a diet and nutrition specialist (dietitian) about how much iron you should eat each day. What are tips for following this plan? Reading food labels  Check food labels to see how many milligrams (mg)  of iron are in each serving. Cooking  Cook foods in pots and pans that are made from iron.  Take these steps to make it easier for your body to absorb iron  from certain foods: ? Soak beans overnight before cooking. ? Soak whole grains overnight and drain them before using. ? Ferment flours before baking, such as by using yeast in bread dough. Meal planning  When you eat foods that contain iron, you should eat them with foods that are high in vitamin C. These include oranges, peppers, tomatoes, potatoes, and mango. Vitamin C helps your body to absorb iron. General information  Take iron supplements only as told by your health care provider. An overdose of iron can be life-threatening. If you were prescribed iron supplements, take them with orange juice or a vitamin C supplement.  When you eat iron-fortified foods or take an iron supplement, you should also eat foods that naturally contain iron, such as meat, poultry, and fish. Eating naturally iron-rich foods helps your body to absorb the iron that is added to other foods or contained in a supplement.  Certain foods and drinks prevent your body from absorbing iron properly. Avoid eating these foods in the same meal as iron-rich foods or with iron supplements. These foods include: ? Coffee, black tea, and red wine. ? Milk, dairy products, and foods that are high in calcium. ? Beans and soybeans. ? Whole grains. What foods should I eat? Fruits Prunes. Raisins. Eat fruits high in vitamin C, such as oranges, grapefruits, and strawberries, alongside iron-rich foods. Vegetables Spinach (cooked). Green peas. Broccoli. Fermented vegetables. Eat vegetables high in vitamin C, such as leafy greens, potatoes, bell peppers, and tomatoes, alongside iron-rich foods. Grains Iron-fortified breakfast cereal. Iron-fortified whole-wheat bread. Enriched rice. Sprouted grains. Meats and other proteins Beef liver. Oysters. Beef. Shrimp. [place]. Chicken. Tuna. Sardines. Chickpeas. Nuts. Tofu. Pumpkin seeds. Beverages Tomato juice. Fresh orange juice. Prune juice. Hibiscus tea. Fortified instant breakfast  shakes. Sweets and desserts Blackstrap molasses. Seasonings and condiments Tahini. Fermented soy sauce. Other foods Wheat germ. The items listed above may not be a complete list of recommended foods and beverages. Contact a dietitian for more information. What foods should I avoid? Grains Whole grains. Bran cereal. Bran flour. Oats. Meats and other proteins Soybeans. Products made from soy protein. Black beans. Lentils. Mung beans. Split peas. Dairy Milk. Cream. Cheese. Yogurt. Cottage cheese. Beverages Coffee. Black tea. Red wine. Sweets and desserts Cocoa. Chocolate. Ice cream. Other foods Basil. Oregano. Large amounts of parsley. The items listed above may not be a complete list of foods and beverages to avoid. Contact a dietitian for more information. Summary  Iron is a mineral that helps your body to produce hemoglobin. Hemoglobin is a protein in red blood cells that carries oxygen to your body's tissues.  Iron is naturally found in many foods, and many foods have iron added to them (iron-fortified foods).  When you eat foods that contain iron, you should eat them with foods that are high in vitamin C. Vitamin C helps your body to absorb iron.  Certain foods and drinks prevent your body from absorbing iron properly, such as whole grains and dairy products. You should avoid eating these foods in the same meal as iron-rich foods or with iron supplements. This information is not intended to replace advice given to you by your health care provider. Make sure you discuss any questions you have with your health care provider. Document Revised: 09/01/2017 Document Reviewed: 08/15/2017 Elsevier  Patient Education  2020 [employer] [D] Immune Globulin injection What is this medicine? RhO [D] IMMUNE GLOBULIN (i MYOON GLOB yoo lin) is used to treat idiopathic thrombocytopenic purpura (ITP). This medicine is used in RhO negative mothers who are pregnant with a RhO positive child.  It is also used after a transfusion of RhO positive blood into a RhO negative person. This medicine may be used for other purposes; ask your health care provider or pharmacist if you have questions. COMMON BRAND NAME(S): BayRho-D, HyperRHO S/D, MICRhoGAM, RhoGAM, Rhophylac, WinRho SDF What should I tell my health care provider before I take this medicine? They need to know if you have any of these conditions:  bleeding disorders  low levels of immunoglobulin A in the body  no spleen  an unusual or allergic reaction to human immune globulin, other medicines, foods, dyes, or preservatives  pregnant or trying to get pregnant  breast-feeding How should I use this medicine? This medicine is for injection into a muscle or into a vein. It is given by a health care professional in a hospital or clinic setting. Talk to your pediatrician regarding the use of this medicine in children. This medicine is not approved for use in children. Overdosage: If you think you have taken too much of this medicine contact a poison control center or emergency room at once. NOTE: This medicine is only for you. Do not share this medicine with others. What if I miss a dose? It is important not to miss your dose. Call your doctor or health care professional if you are unable to keep an appointment. What may interact with this medicine?  live virus vaccines, like measles, mumps, or rubella This list may not describe all possible interactions. Give your health care provider a list of all the medicines, herbs, non-prescription drugs, or dietary supplements you use. Also tell them if you smoke, drink alcohol, or use illegal drugs. Some items may interact with your medicine. What should I watch for while using this medicine? This medicine is made from human blood. It may be possible to pass an infection in this medicine. Talk to your doctor about the risks and benefits of this medicine. This medicine may interfere with  live virus vaccines. Before you get live virus vaccines tell your health care professional if you have received this medicine within the past 3 months. What side effects may I notice from receiving this medicine? Side effects that you should report to your doctor or health care professional as soon as possible:  allergic reactions like skin rash, itching or hives, swelling of the face, lips, or tongue  breathing problems  chest pain or tightness  yellowing of the eyes or skin Side effects that usually do not require medical attention (report to your doctor or health care professional if they continue or are bothersome):  fever  pain and tenderness at site where injected This list may not describe all possible side effects. Call your doctor for medical advice about side effects. You may report side effects to FDA at 1-800-FDA-1088. Where should I keep my medicine? This drug is given in a hospital or clinic and will not be stored at home. NOTE: This sheet is a summary. It may not cover all possible information. If you have questions about this medicine, talk to your doctor, pharmacist, or health care provider.  2020 Elsevier/Gold Standard (2008-05-19 14:06:10)

## 2019-12-18 NOTE — Progress Notes (Signed)
   LOW-RISK PREGNANCY OFFICE VISIT Patient name: Julie [name] MRN [id number]  Date of birth: [birth year] Chief Complaint:   Routine Prenatal Visit  History of Present Illness:   Julie [name] is a 24 y.o. G53P2002 female at [redacted]w[redacted]d with an Estimated Date of Delivery: 03/11/20 being seen today for ongoing management of a low-risk pregnancy.  Today she reports vaginal discharge that she was Rx'd a cream for, but did not use it "because wasn't sure could use it". Contractions: Not present. Vag. Bleeding: None.  Movement: Present. denies leaking of fluid. Review of Systems:   Pertinent items are noted in HPI Denies abnormal vaginal discharge w/ itching/odor/irritation, headaches, visual changes, shortness of breath, chest pain, abdominal pain, severe nausea/vomiting, or problems with urination or bowel movements unless otherwise stated above. Pertinent History Reviewed:  Reviewed past medical,surgical, social, obstetrical and family history.  Reviewed problem list, medications and allergies. Physical Assessment:   Vitals:   12/19/19 0828  BP: 104/65  Pulse: (!) 120  Temp: (!) 97.5 F (36.4 C)  Weight: 149 lb 3.2 oz (67.7 kg)  Body mass index is 24.83 kg/m.        Physical Examination:   General appearance: Well appearing, and in no distress  Mental status: Alert, oriented to person, place, and time  Skin: Warm & dry  Cardiovascular: Normal heart rate noted  Respiratory: Normal respiratory effort, no distress  Abdomen: Soft, gravid, nontender  Pelvic: Cervical exam deferred         Extremities: Edema: None  Fetal Status: Fetal Heart Rate (bpm): 145   Movement: Present    No results found for this or any previous visit (from the past 24 hour(s)).  Assessment & Plan:  1) Low-risk pregnancy G3P2002 at [redacted]w[redacted]d with an Estimated Date of Delivery: 03/11/20   2) Supervision of other normal pregnancy, antepartum  - Glucose Tolerance, 2 Hours w/1 Hour,  - HIV Antibody (routine testing w  rflx),  - RPR, CBC, rho (d) immune globulin (RHIG/RHOPHYLAC) injection 300 mcg  4) GBS bacteriuria  - Culture, OB Urine  5) Need for rhogam due to Rh negative mother  - rho (d) immune globulin (RHIG/RHOPHYLAC) injection 300 mcg    Meds:  Meds ordered this encounter  Medications  . rho (d) immune globulin (RHIG/RHOPHYLAC) injection 300 mcg   Labs/procedures today: 2 hr GTT, 3rd trimester labs  Plan:  Continue routine obstetrical care   Reviewed: Preterm labor symptoms and general obstetric precautions including but not limited to vaginal bleeding, contractions, leaking of fluid and fetal movement were reviewed in detail with the patient.  All questions were answered. Has home bp cuff. Check bp weekly, let us know if >140/90.   Follow-up: Return in about 4 weeks (around 01/16/2020) for Return OB - My Chart video.  Orders Placed This Encounter  Procedures  . Culture, OB Urine  . Glucose Tolerance, 2 Hours w/1 Hour  . HIV Antibody (routine testing w rflx)  . RPR  . CBC   Raelyn Mora MSN, CNM 12/19/2019

## 2019-12-19 ENCOUNTER — Ambulatory Visit (INDEPENDENT_AMBULATORY_CARE_PROVIDER_SITE_OTHER): Payer: Medicaid Other | Admitting: Obstetrics and Gynecology

## 2019-12-19 ENCOUNTER — Other Ambulatory Visit: Payer: Self-pay

## 2019-12-19 ENCOUNTER — Encounter: Payer: Self-pay | Admitting: Obstetrics and Gynecology

## 2019-12-19 VITALS — BP 104/65 | HR 120 | Temp 97.5°F | Wt 149.2 lb

## 2019-12-19 DIAGNOSIS — Z3A28 28 weeks gestation of pregnancy: Secondary | ICD-10-CM

## 2019-12-19 DIAGNOSIS — Z2913 Encounter for prophylactic Rho(D) immune globulin: Secondary | ICD-10-CM | POA: Diagnosis not present

## 2019-12-19 DIAGNOSIS — Z3483 Encounter for supervision of other normal pregnancy, third trimester: Secondary | ICD-10-CM | POA: Diagnosis not present

## 2019-12-19 DIAGNOSIS — Z3480 Encounter for supervision of other normal pregnancy, unspecified trimester: Secondary | ICD-10-CM

## 2019-12-19 DIAGNOSIS — R8271 Bacteriuria: Secondary | ICD-10-CM

## 2019-12-19 MED ORDER — RHO D IMMUNE GLOBULIN 1500 UNIT/2ML IJ SOSY
300.0000 ug | PREFILLED_SYRINGE | Freq: Once | INTRAMUSCULAR | Status: AC
  Administered 2019-12-19: 300 ug via INTRAMUSCULAR

## 2019-12-20 ENCOUNTER — Telehealth: Payer: Self-pay | Admitting: *Deleted

## 2019-12-20 DIAGNOSIS — O99019 Anemia complicating pregnancy, unspecified trimester: Secondary | ICD-10-CM

## 2019-12-20 LAB — HIV ANTIBODY (ROUTINE TESTING W REFLEX): HIV Screen 4th Generation wRfx: NONREACTIVE

## 2019-12-20 LAB — CBC
Hematocrit: 28.7 % — ABNORMAL LOW (ref 34.0–46.6)
Hemoglobin: 9.8 g/dL — ABNORMAL LOW (ref 11.1–15.9)
MCH: 28.2 pg (ref 26.6–33.0)
MCHC: 34.1 g/dL (ref 31.5–35.7)
MCV: 83 fL (ref 79–97)
Platelets: 258 10*3/uL (ref 150–450)
RBC: 3.47 x10E6/uL — ABNORMAL LOW (ref 3.77–5.28)
RDW: 14.1 % (ref 11.7–15.4)
WBC: 5.9 10*3/uL (ref 3.4–10.8)

## 2019-12-20 LAB — RPR: RPR Ser Ql: NONREACTIVE

## 2019-12-20 LAB — GLUCOSE TOLERANCE, 2 HOURS W/ 1HR
Glucose, 1 hour: 104 mg/dL (ref 65–179)
Glucose, 2 hour: 90 mg/dL (ref 65–152)
Glucose, Fasting: 76 mg/dL (ref 65–91)

## 2019-12-20 MED ORDER — FERROUS SULFATE 325 (65 FE) MG PO TABS: 325.0000 mg | ORAL_TABLET | Freq: Two times a day (BID) | ORAL | 3 refills | Status: DC

## 2019-12-20 NOTE — Telephone Encounter (Signed)
-----   Message from Raelyn Mora, [place] sent at 12/20/2019  9:36 AM EDT ----- Please start on FeSO4 325 mg with vitamin C 1 tab QOD

## 2019-12-20 NOTE — Progress Notes (Signed)
Please start on FeSO4 325 mg with vitamin C 1 tab QOD

## 2019-12-22 LAB — URINE CULTURE, OB REFLEX

## 2019-12-22 LAB — CULTURE, OB URINE

## 2019-12-23 ENCOUNTER — Encounter: Payer: Self-pay | Admitting: General Practice

## 2020-01-15 ENCOUNTER — Encounter: Payer: Self-pay | Admitting: Student

## 2020-01-15 ENCOUNTER — Telehealth (INDEPENDENT_AMBULATORY_CARE_PROVIDER_SITE_OTHER): Payer: Medicaid Other | Admitting: Student

## 2020-01-15 VITALS — Wt 150.0 lb

## 2020-01-15 DIAGNOSIS — R102 Pelvic and perineal pain: Secondary | ICD-10-CM

## 2020-01-15 DIAGNOSIS — R8271 Bacteriuria: Secondary | ICD-10-CM

## 2020-01-15 DIAGNOSIS — O26893 Other specified pregnancy related conditions, third trimester: Secondary | ICD-10-CM

## 2020-01-15 DIAGNOSIS — Z3A32 32 weeks gestation of pregnancy: Secondary | ICD-10-CM

## 2020-01-15 DIAGNOSIS — O26899 Other specified pregnancy related conditions, unspecified trimester: Secondary | ICD-10-CM

## 2020-01-15 DIAGNOSIS — Z3480 Encounter for supervision of other normal pregnancy, unspecified trimester: Secondary | ICD-10-CM

## 2020-01-15 DIAGNOSIS — O98813 Other maternal infectious and parasitic diseases complicating pregnancy, third trimester: Secondary | ICD-10-CM

## 2020-01-15 MED ORDER — BLOOD PRESSURE MONITOR AUTOMAT DEVI: 1.0000 | Freq: Every day | 0 refills | Status: DC

## 2020-01-15 MED ORDER — COMFORT FIT MATERNITY SUPP SM MISC: 1.0000 [IU] | Freq: Every day | 0 refills | Status: DC | PRN

## 2020-01-15 NOTE — Patient Instructions (Signed)

## 2020-01-15 NOTE — Progress Notes (Signed)
I connected with@ on 01/15/20 at 10:50 AM EDT by: mychart and verified that I am speaking with the correct person using two identifiers.  Patient is located at home and provider is located at [employer].     The purpose of this virtual visit is to provide medical care while limiting exposure to the novel coronavirus. I discussed the limitations, risks, security and privacy concerns of performing an evaluation and management service by mychart and the availability of in person appointments. I also discussed with the patient that there may be a patient responsible charge related to this service. By engaging in this virtual visit, you consent to the provision of healthcare.  Additionally, you authorize for your insurance to be billed for the services provided during this visit.  The patient expressed understanding and agreed to proceed.  The following staff members participated in the virtual visit:  Dorisann Frames RN    PRENATAL VISIT NOTE  Subjective:  Julie [name] is a 24 y.o. G3P2002 at [redacted]w[redacted]d  for phone visit for ongoing prenatal care.  She is currently monitored for the following issues for this low-risk pregnancy and has Gallstone pancreatitis; Supervision of other normal pregnancy, antepartum; and GBS bacteriuria on their problem list.  Patient reports pelvic pressure. Mainly at night. Makes it difficult to walk. .  Contractions: Not present. Vag. Bleeding: None.  Movement: Present. Denies leaking of fluid.   The following portions of the patient's history were reviewed and updated as appropriate: allergies, current medications, past family history, past medical history, past social history, past surgical history and problem list.   Objective:   Vitals:   01/15/20 1056  Weight: 150 lb (68 kg)   Self-Obtained  Fetal Status:     Movement: Present     Assessment and Plan:  Pregnancy: G3P2002 at [redacted]w[redacted]d 1. Supervision of other normal pregnancy, antepartum -doesn't have BP cuff at  home. Reviewed s/s of preeclampsia. Cuff sent to [facility] for her to pick up - Blood Pressure Monitoring (BLOOD PRESSURE MONITOR AUTOMAT) DEVI; 1 Device by Does not apply route daily. Automatic blood pressure cuff regular size. To monitor blood pressure regularly at home. ICD-10 code:Z34.90  Dispense: 1 each; Refill: 0  2. Pain of round ligament during pregnancy -maternity support belt rx printed out, she will pick up from the office later this week - Elastic Bandages & Supports (COMFORT FIT MATERNITY SUPP SM) MISC; 1 Units by Does not apply route daily as needed.  Dispense: 1 each; Refill: 0  3. GBS bacteriuria -needs test of cure for UTI. No symptoms. Will leave urine sample when she comes in to pick up her prescription this week - Culture, OB Urine; Future  Preterm labor symptoms and general obstetric precautions including but not limited to vaginal bleeding, contractions, leaking of fluid and fetal movement were reviewed in detail with the patient.  Return for already scheduled for follow up.  Future Appointments  Date Time Provider Department Center  02/13/2020  1:10 PM Raelyn Mora, CNM [facility] None     Time spent on virtual visit: 6 minutes  Judeth Horn, NP

## 2020-01-31 ENCOUNTER — Encounter (HOSPITAL_COMMUNITY): Payer: Self-pay | Admitting: Obstetrics & Gynecology

## 2020-01-31 ENCOUNTER — Other Ambulatory Visit: Payer: Self-pay

## 2020-01-31 ENCOUNTER — Inpatient Hospital Stay (HOSPITAL_COMMUNITY)
Admission: AD | Admit: 2020-01-31 | Discharge: 2020-01-31 | Disposition: A | Discharge status: Home / Self Care | Payer: Medicaid Other | Attending: Obstetrics & Gynecology | Admitting: Obstetrics & Gynecology

## 2020-01-31 DIAGNOSIS — N949 Unspecified condition associated with female genital organs and menstrual cycle: Secondary | ICD-10-CM | POA: Diagnosis not present

## 2020-01-31 DIAGNOSIS — O26893 Other specified pregnancy related conditions, third trimester: Secondary | ICD-10-CM | POA: Diagnosis present

## 2020-01-31 DIAGNOSIS — K219 Gastro-esophageal reflux disease without esophagitis: Secondary | ICD-10-CM | POA: Insufficient documentation

## 2020-01-31 DIAGNOSIS — O99613 Diseases of the digestive system complicating pregnancy, third trimester: Secondary | ICD-10-CM | POA: Diagnosis not present

## 2020-01-31 DIAGNOSIS — R8271 Bacteriuria: Secondary | ICD-10-CM | POA: Insufficient documentation

## 2020-01-31 DIAGNOSIS — O99820 Streptococcus B carrier state complicating pregnancy: Secondary | ICD-10-CM | POA: Insufficient documentation

## 2020-01-31 DIAGNOSIS — Z3A34 34 weeks gestation of pregnancy: Secondary | ICD-10-CM | POA: Insufficient documentation

## 2020-01-31 DIAGNOSIS — R102 Pelvic and perineal pain: Secondary | ICD-10-CM | POA: Diagnosis not present

## 2020-01-31 LAB — URINALYSIS, ROUTINE W REFLEX MICROSCOPIC
Bilirubin Urine: NEGATIVE
Glucose, UA: 50 mg/dL — AB
Hgb urine dipstick: NEGATIVE
Ketones, ur: NEGATIVE mg/dL
Leukocytes,Ua: NEGATIVE
Nitrite: NEGATIVE
Protein, ur: 30 mg/dL — AB
Specific Gravity, Urine: 1.028 (ref 1.005–1.030)
pH: 5 (ref 5.0–8.0)

## 2020-01-31 MED ORDER — CYCLOBENZAPRINE HCL 5 MG PO TABS
10.0000 mg | ORAL_TABLET | Freq: Once | ORAL | Status: AC
  Administered 2020-01-31: 10 mg via ORAL
  Filled 2020-01-31: qty 2

## 2020-01-31 NOTE — Discharge Instructions (Signed)

## 2020-01-31 NOTE — MAU Note (Signed)
.  Julie [name] is a 24 y.o. at [redacted]w[redacted]d here in [facility]U reporting with c/o increased pressure that began today at 0230. Pt states she can barely walk at night unless she is holding onto something due to increased pressure in her bottom. Pt reports headaches off and on for the past 2-3 weeks with a current headache, rating it as a 7. Pt states she took extra strength Tylenol at 1200. Pt reports no LOF or VB. +FM.   Onset of complaint: 01/31/20 Pain score: 7 Vitals:   01/31/20 1248  BP: 110/69  Pulse: (!) 116  Resp: 16  Temp: 98.5 F (36.9 C)  SpO2: 99%     FHT: 152

## 2020-01-31 NOTE — MAU Provider Note (Signed)
Chief Complaint:  Abdominal Pain   First Provider Initiated Contact with Patient 01/31/20 1310      HPI: Julie [name] is a 24 y.o. G3P2002 at [redacted]w[redacted]d who presents to maternity admissions reporting pelvic pressure. This has been present for a few weeks but more so in the last few days. Currently denies any pain or pressure when sitting but reports she has it when standing or walking. She was sent Flexeril after last visit but went to wrong pharmacy and therefore did not pick it up. She stands on her feet all day with work. Denies abdominal pain unrelated to pressure. Reports a headache that just started today. She has taken tylenol for pressure without relief. She has not taken anything for pressure. Denies vision changes, RUQ pain, SOB. She reports good fetal movement, denies LOF, vaginal bleeding, vaginal itching/burning, urinary symptoms, h/a, dizziness, n/v, or fever/chills.    Past Medical History: Past Medical History:  Diagnosis Date  . GERD (gastroesophageal reflux disease)     Past obstetric history: OB History  Gravida Para Term Preterm AB Living  3 2 2     2   SAB TAB Ectopic Multiple Live Births        0 2    # Outcome Date GA Lbr Len/2nd Weight Sex Delivery Anes PTL Lv  3 Current           2 Term 04/21/19 [redacted]w[redacted]d 11:26 / 00:25 3201 g F Vag-Spont EPI  LIV     Birth Comments: WNL  1 Term 10/12/17 [redacted]w[redacted]d / 01:06 2761 g F Vag-Spont EPI  LIV     Birth Comments: WNL    Past Surgical History: Past Surgical History:  Procedure Laterality Date  . CHOLECYSTECTOMY N/A 01/09/2018   Procedure: LAPAROSCOPIC CHOLECYSTECTOMY WITH INTRAOPERATIVE CHOLANGIOGRAM;  Surgeon: Fanny Skates, MD;  Location: [facility];  Service: General;  Laterality: N/A;    Family History: Family History  Problem Relation Age of Onset  . Diabetes Mother   . Pancreatic cancer Father   . Cancer Maternal Grandfather     Social History: Social History   Tobacco Use  . Smoking status: Never  Smoker  . Smokeless tobacco: Never Used  Substance Use Topics  . Alcohol use: No  . Drug use: No    Allergies: No Known Allergies  Meds:  Medications Prior to Admission  Medication Sig Dispense Refill Last Dose  . acetaminophen (TYLENOL) 500 MG tablet Take 1,000 mg by mouth every 6 (six) hours as needed for moderate pain.   01/31/2020 at 1200  . Misc. Devices (GOJJI WEIGHT SCALE) MISC 1 Device by Does not apply route daily as needed. To weight self daily as needed at home. ICD-10 code: O09.90 1 each 0 Past Week at Unknown time  . Blood Pressure Monitoring (BLOOD PRESSURE MONITOR AUTOMAT) DEVI 1 Device by Does not apply route daily. Automatic blood pressure cuff regular size. To monitor blood pressure regularly at home. ICD-10 code:Z34.90 1 each 0   . cyclobenzaprine (FLEXERIL) 10 MG tablet Take 1 tablet (10 mg total) by mouth at bedtime as needed for muscle spasms. (Patient not taking: Reported on 01/15/2020) 30 tablet 0   . Elastic Bandages & Supports (COMFORT FIT MATERNITY SUPP SM) MISC 1 Units by Does not apply route daily as needed. 1 each 0   . ferrous sulfate (FERROUSUL) 325 (65 FE) MG tablet Take 1 tablet (325 mg total) by mouth 2 (two) times daily with a meal. (Patient not taking: Reported on  01/15/2020) 100 tablet 3   . polyethylene glycol (MIRALAX / GLYCOLAX) 17 g packet Take 17 g by mouth daily. (Patient not taking: Reported on 01/15/2020) 14 each 3   . Prenatal MV-Min-FA-Omega-3 (PRENATAL GUMMIES/DHA & FA) 0.4-32.5 MG CHEW Chew 1 Dose by mouth daily. (Patient not taking: Reported on 10/02/2019) 30 tablet 1     ROS:  Review of Systems All other systems negative unless noted above in HPI.   I have reviewed patient's Past Medical Hx, Surgical Hx, Family Hx, Social Hx, medications and allergies.   Physical Exam   Patient Vitals for the past 24 hrs:  BP Temp Temp src Pulse Resp SpO2 Height Weight  01/31/20 1415 114/62 98.4 F (36.9 C) Oral (!) 109 16 -- -- --  01/31/20 1330  112/68 -- -- (!) 117 -- -- -- --  01/31/20 1248 110/69 98.5 F (36.9 C) Oral (!) 116 16 99 % -- --  01/31/20 1235 -- -- -- -- -- -- 5\' 5"  (1.651 m) 71.1 kg   Constitutional: Well-developed, well-nourished female in no acute distress.  Cardiovascular: normal rate Respiratory: normal effort GI: Abd soft, non-tender, gravid appropriate for gestational age.  MS: Extremities nontender, no edema, normal ROM Neurologic: Alert and oriented x 4.  GU: Neg CVAT. PELVIC EXAM: Cervix pink, visually closed, without lesion, scant white creamy discharge, vaginal walls and external genitalia normal Bimanual exam: Cervix 0/long/high, firm, anterior, neg CMT, uterus nontender, nonenlarged, adnexa without tenderness, enlargement, or mass  Dilation: Closed Effacement (%): Thick Cervical Position: Posterior Presentation: Vertex Exam by:: C Effa Yarrow MD  FHT:  Baseline 145, moderate variability, accelerations present, no decelerations Contractions: None   Labs: Results for orders placed or performed during the hospital encounter of 01/31/20 (from the past 24 hour(s))  Urinalysis, Routine w reflex microscopic     Status: Abnormal   Collection Time: 01/31/20 12:41 PM  Result Value Ref Range   Color, Urine AMBER (A) YELLOW   APPearance HAZY (A) CLEAR   Specific Gravity, Urine 1.028 1.005 - 1.030   pH 5.0 5.0 - 8.0   Glucose, UA 50 (A) NEGATIVE mg/dL   Hgb urine dipstick NEGATIVE NEGATIVE   Bilirubin Urine NEGATIVE NEGATIVE   Ketones, ur NEGATIVE NEGATIVE mg/dL   Protein, ur 30 (A) NEGATIVE mg/dL   Nitrite NEGATIVE NEGATIVE   Leukocytes,Ua NEGATIVE NEGATIVE   RBC / HPF 0-5 0 - 5 RBC/hpf   WBC, UA 6-10 0 - 5 WBC/hpf   Bacteria, UA MANY (A) NONE SEEN   Squamous Epithelial / LPF 21-50 0 - 5   Mucus PRESENT    O/Positive/-- (12/30 1522)  Imaging:  No results found.  MAU Course/MDM: Orders Placed This Encounter  Procedures  . Culture, Urine  . Urinalysis, Routine w reflex microscopic  . Discharge  patient    Meds ordered this encounter  Medications  . cyclobenzaprine (FLEXERIL) tablet 10 mg     NST reviewed: Reactive without ctx on TOCO as noted above. Patient with pelvic pressure in third trimester. Only present when standing or walking and worse at night after standing all day. Flexeril given which resolved headache and pelvic pain. Flexeril previously prescribed and patient did not pick up as she went to the wrong pharmacy; she can pick up this medication to take in the evenings for pelvic pain. Cervix closed/thick/hi. Patient also with GBS bacteriuria and amoxicillin previously prescribed but again went to wrong pharmacy. Called pharmacy and prescription re-filled. Patient needs repeat urine culture after confirming she took  abx.  Patient felt comfortable discharging and was able to stand without pain. Patient verbalized understanding of discharge and follow-up instructions and was ambulating without assistance upon discharge. Pt discharged with strict return precautions.  Future Appointments  Date Time Provider Department Center  02/13/2020  1:10 PM Raelyn Mora, CNM [facility] None    Assessment: 1. Feeling pelvic pressure in pregnancy in third trimester, antepartum   2. GBS bacteriuria     Plan: Discharge home Labor precautions and fetal kick counts Follow-up Information    [facility] Follow up.   Specialty: Obstetrics and Gynecology Contact information: [address] [place] [address] [phone]         Allergies as of 01/31/2020   No Known Allergies     Medication List    TAKE these medications   acetaminophen 500 MG tablet Commonly known as: TYLENOL Take 1,000 mg by mouth every 6 (six) hours as needed for moderate pain.   Blood Pressure Monitor Automat Devi 1 Device by Does not apply route daily. Automatic blood pressure cuff regular size. To monitor blood pressure regularly at home. ICD-10 code:Z34.90    Comfort Fit Maternity Supp Sm Misc 1 Units by Does not apply route daily as needed.   cyclobenzaprine 10 MG tablet Commonly known as: FLEXERIL Take 1 tablet (10 mg total) by mouth at bedtime as needed for muscle spasms.   ferrous sulfate 325 (65 FE) MG tablet Commonly known as: FerrouSul Take 1 tablet (325 mg total) by mouth 2 (two) times daily with a meal.   Gojji Weight Scale Misc 1 Device by Does not apply route daily as needed. To weight self daily as needed at home. ICD-10 code: O09.90   polyethylene glycol 17 g packet Commonly known as: MIRALAX / GLYCOLAX Take 17 g by mouth daily.   Prenatal Gummies/DHA & FA 0.4-32.5 MG Chew Chew 1 Dose by mouth daily.       Jerilynn Birkenhead, MD [facility] Family Medicine Fellow, [facility] for [facility], [facility] 01/31/2020 2:21 PM

## 2020-02-01 LAB — URINE CULTURE: Culture: >=100000 — AB

## 2020-02-13 ENCOUNTER — Other Ambulatory Visit: Payer: Self-pay

## 2020-02-13 ENCOUNTER — Other Ambulatory Visit (HOSPITAL_COMMUNITY)
Admission: RE | Admit: 2020-02-13 | Discharge: 2020-02-13 | Disposition: A | Discharge status: Home / Self Care | Payer: Medicaid Other | Source: Ambulatory Visit | Attending: Obstetrics and Gynecology | Admitting: Obstetrics and Gynecology

## 2020-02-13 ENCOUNTER — Ambulatory Visit (INDEPENDENT_AMBULATORY_CARE_PROVIDER_SITE_OTHER): Payer: Medicaid Other | Admitting: Obstetrics and Gynecology

## 2020-02-13 VITALS — BP 101/63 | HR 117 | Temp 98.2°F | Wt 155.8 lb

## 2020-02-13 DIAGNOSIS — Z3480 Encounter for supervision of other normal pregnancy, unspecified trimester: Secondary | ICD-10-CM | POA: Insufficient documentation

## 2020-02-13 DIAGNOSIS — R8271 Bacteriuria: Secondary | ICD-10-CM

## 2020-02-13 NOTE — Patient Instructions (Signed)

## 2020-02-13 NOTE — Progress Notes (Signed)
   LOW-RISK PREGNANCY OFFICE VISIT Patient name: Julie [name] MRN [id number]  Date of birth: 16-Feb-1996 Chief Complaint:   Routine Prenatal Visit  History of Present Illness:   Julie [name] is a 24 y.o. G33P2002 female at [redacted]w[redacted]d with an Estimated Date of Delivery: 03/11/20 being seen today for ongoing management of a low-risk pregnancy.  Today she reports occasional contractions and increased pelvic pressure. Contractions: Not present. Vag. Bleeding: None.  Movement: Present. denies leaking of fluid. Review of Systems:   Pertinent items are noted in HPI Denies abnormal vaginal discharge w/ itching/odor/irritation, headaches, visual changes, shortness of breath, chest pain, abdominal pain, severe nausea/vomiting, or problems with urination or bowel movements unless otherwise stated above. Pertinent History Reviewed:  Reviewed past medical,surgical, social, obstetrical and family history.  Reviewed problem list, medications and allergies. Physical Assessment:   Vitals:   02/13/20 1304  BP: 101/63  Pulse: (!) 117  Temp: 98.2 F (36.8 C)  Weight: 155 lb 12.8 oz (70.7 kg)  Body mass index is 25.93 kg/m.        Physical Examination:   General appearance: Well appearing, and in no distress  Mental status: Alert, oriented to person, place, and time  Skin: Warm & dry  Cardiovascular: Normal heart rate noted  Respiratory: Normal respiratory effort, no distress  Abdomen: Soft, gravid, nontender  Pelvic: Cervical exam performed  Dilation: 1 Effacement (%): Thick Station: Ballotable  Extremities: Edema: None  Fetal Status: Fetal Heart Rate (bpm): 147 Fundal Height: 38 cm Movement: Present Presentation: Vertex  No results found for this or any previous visit (from the past 24 hour(s)).  Assessment & Plan:  1) Low-risk pregnancy G3P2002 at [redacted]w[redacted]d with an Estimated Date of Delivery: 03/11/20   2) Supervision of other normal pregnancy, antepartum  - Cervicovaginal ancillary only( [facility]),   3) GBS bacteriuria - Treat for GBS in labor   Meds: No orders of the defined types were placed in this encounter.  Labs/procedures today: Wet Prep  Plan:  Continue routine obstetrical care   Reviewed: Preterm labor symptoms and general obstetric precautions including but not limited to vaginal bleeding, contractions, leaking of fluid and fetal movement were reviewed in detail with the patient.  All questions were answered. Has home bp cuff. Check bp weekly, let us know if >140/90.   Follow-up: Return in about 1 week (around 02/20/2020) for Return OB - My Chart video.  Orders Placed This Encounter  Procedures  . Culture, beta strep (group b only)   Raelyn Mora MSN, CNM 02/13/2020 1:35 PM

## 2020-02-14 LAB — CERVICOVAGINAL ANCILLARY ONLY
Bacterial Vaginitis (gardnerella): NEGATIVE
Candida Glabrata: NEGATIVE
Candida Vaginitis: NEGATIVE
Chlamydia: NEGATIVE
Comment: NEGATIVE
Comment: NEGATIVE
Comment: NEGATIVE
Comment: NEGATIVE
Comment: NEGATIVE
Comment: NORMAL
Neisseria Gonorrhea: NEGATIVE
Trichomonas: NEGATIVE

## 2020-02-20 ENCOUNTER — Telehealth: Payer: Medicaid Other | Admitting: Obstetrics and Gynecology

## 2020-02-26 ENCOUNTER — Other Ambulatory Visit: Payer: Self-pay

## 2020-02-26 ENCOUNTER — Inpatient Hospital Stay (HOSPITAL_COMMUNITY)
Admission: AD | Admit: 2020-02-26 | Discharge: 2020-02-28 | DRG: 807 | Disposition: A | Discharge status: Home / Self Care | Payer: Medicaid Other | Attending: Obstetrics & Gynecology | Admitting: Obstetrics & Gynecology

## 2020-02-26 ENCOUNTER — Encounter (HOSPITAL_COMMUNITY): Payer: Self-pay | Admitting: Obstetrics and Gynecology

## 2020-02-26 ENCOUNTER — Inpatient Hospital Stay (HOSPITAL_BASED_OUTPATIENT_CLINIC_OR_DEPARTMENT_OTHER): Payer: Medicaid Other

## 2020-02-26 DIAGNOSIS — O99820 Streptococcus B carrier state complicating pregnancy: Secondary | ICD-10-CM | POA: Diagnosis not present

## 2020-02-26 DIAGNOSIS — O288 Other abnormal findings on antenatal screening of mother: Secondary | ICD-10-CM

## 2020-02-26 DIAGNOSIS — Z20822 Contact with and (suspected) exposure to covid-19: Secondary | ICD-10-CM | POA: Diagnosis present

## 2020-02-26 DIAGNOSIS — Z3A38 38 weeks gestation of pregnancy: Secondary | ICD-10-CM

## 2020-02-26 DIAGNOSIS — O9902 Anemia complicating childbirth: Secondary | ICD-10-CM | POA: Diagnosis present

## 2020-02-26 DIAGNOSIS — O99824 Streptococcus B carrier state complicating childbirth: Principal | ICD-10-CM | POA: Diagnosis present

## 2020-02-26 DIAGNOSIS — Z3480 Encounter for supervision of other normal pregnancy, unspecified trimester: Secondary | ICD-10-CM

## 2020-02-26 DIAGNOSIS — Z6791 Unspecified blood type, Rh negative: Secondary | ICD-10-CM

## 2020-02-26 DIAGNOSIS — O26893 Other specified pregnancy related conditions, third trimester: Secondary | ICD-10-CM | POA: Diagnosis present

## 2020-02-26 DIAGNOSIS — R8271 Bacteriuria: Secondary | ICD-10-CM | POA: Diagnosis present

## 2020-02-26 DIAGNOSIS — D649 Anemia, unspecified: Secondary | ICD-10-CM | POA: Diagnosis present

## 2020-02-26 DIAGNOSIS — O99013 Anemia complicating pregnancy, third trimester: Secondary | ICD-10-CM

## 2020-02-26 LAB — CBC
HCT: 30.9 % — ABNORMAL LOW (ref 36.0–46.0)
Hemoglobin: 9.4 g/dL — ABNORMAL LOW (ref 12.0–15.0)
MCH: 24.7 pg — ABNORMAL LOW (ref 26.0–34.0)
MCHC: 30.4 g/dL (ref 30.0–36.0)
MCV: 81.1 fL (ref 80.0–100.0)
Platelets: 261 10*3/uL (ref 150–400)
RBC: 3.81 MIL/uL — ABNORMAL LOW (ref 3.87–5.11)
RDW: 16.3 % — ABNORMAL HIGH (ref 11.5–15.5)
WBC: 8 10*3/uL (ref 4.0–10.5)
nRBC: 0 % (ref 0.0–0.2)

## 2020-02-26 LAB — SARS CORONAVIRUS 2 BY RT PCR (HOSPITAL ORDER, PERFORMED IN ~~LOC~~ HOSPITAL LAB): SARS Coronavirus 2: NEGATIVE

## 2020-02-26 MED ORDER — ONDANSETRON HCL 4 MG/2ML IJ SOLN: 4.0000 mg | Freq: Four times a day (QID) | INTRAMUSCULAR | Status: DC | PRN

## 2020-02-26 MED ORDER — PENICILLIN G POT IN DEXTROSE 60000 UNIT/ML IV SOLN
3.0000 10*6.[IU] | INTRAVENOUS | Status: DC
  Administered 2020-02-27: 3 10*6.[IU] via INTRAVENOUS
  Filled 2020-02-26: qty 50

## 2020-02-26 MED ORDER — OXYTOCIN 40 UNITS IN NORMAL SALINE INFUSION - SIMPLE MED
2.5000 [IU]/h | INTRAVENOUS | Status: DC
  Administered 2020-02-27: 2.5 [IU]/h via INTRAVENOUS

## 2020-02-26 MED ORDER — TERBUTALINE SULFATE 1 MG/ML IJ SOLN: 0.2500 mg | Freq: Once | INTRAMUSCULAR | Status: DC | PRN

## 2020-02-26 MED ORDER — SODIUM CHLORIDE 0.9 % IV SOLN
5.0000 10*6.[IU] | Freq: Once | INTRAVENOUS | Status: AC
  Administered 2020-02-26: 5 10*6.[IU] via INTRAVENOUS
  Filled 2020-02-26: qty 5

## 2020-02-26 MED ORDER — FENTANYL CITRATE (PF) 100 MCG/2ML IJ SOLN: 50.0000 ug | INTRAMUSCULAR | Status: DC | PRN

## 2020-02-26 MED ORDER — LACTATED RINGERS IV SOLN: 500.0000 mL | INTRAVENOUS | Status: DC | PRN

## 2020-02-26 MED ORDER — MISOPROSTOL 50MCG HALF TABLET: 50.0000 ug | ORAL_TABLET | ORAL | Status: DC | PRN

## 2020-02-26 MED ORDER — SOD CITRATE-CITRIC ACID 500-334 MG/5ML PO SOLN: 30.0000 mL | ORAL | Status: DC | PRN

## 2020-02-26 MED ORDER — LACTATED RINGERS IV SOLN: INTRAVENOUS | Status: DC

## 2020-02-26 MED ORDER — OXYCODONE-ACETAMINOPHEN 5-325 MG PO TABS: 2.0000 | ORAL_TABLET | ORAL | Status: DC | PRN

## 2020-02-26 MED ORDER — LIDOCAINE HCL (PF) 1 % IJ SOLN: 30.0000 mL | INTRAMUSCULAR | Status: DC | PRN

## 2020-02-26 MED ORDER — ACETAMINOPHEN 325 MG PO TABS: 650.0000 mg | ORAL_TABLET | ORAL | Status: DC | PRN

## 2020-02-26 MED ORDER — OXYCODONE-ACETAMINOPHEN 5-325 MG PO TABS: 1.0000 | ORAL_TABLET | ORAL | Status: DC | PRN

## 2020-02-26 MED ORDER — OXYTOCIN BOLUS FROM INFUSION
500.0000 mL | Freq: Once | INTRAVENOUS | Status: AC
  Administered 2020-02-27: 500 mL via INTRAVENOUS

## 2020-02-26 MED ORDER — OXYTOCIN 40 UNITS IN NORMAL SALINE INFUSION - SIMPLE MED
1.0000 m[IU]/min | INTRAVENOUS | Status: DC
  Administered 2020-02-26: 2 m[IU]/min via INTRAVENOUS
  Filled 2020-02-26: qty 1000

## 2020-02-26 MED ORDER — FLEET ENEMA 7-19 GM/118ML RE ENEM: 1.0000 | ENEMA | RECTAL | Status: DC | PRN

## 2020-02-26 NOTE — MAU Note (Signed)
Julie [name] is a 24 y.o. at [redacted]w[redacted]d here in MAU reporting: lower abdominal pain and clear watery discharge. States she has been having discharge and has been evaluated for that before but it is now more in amount and soaks through a tissue. No bleeding. +FM  Onset of complaint: ongoing  Pain score: 6/10  Vitals:   02/26/20 1551  BP: 113/63  Pulse: (!) 123  Resp: 16  Temp: 98.1 F (36.7 C)  SpO2: 99%     FHT: +FM  Lab orders placed from triage: none

## 2020-02-26 NOTE — Progress Notes (Signed)
Upon RN entering the room to change positions, pt noted to be sitting up straight. Once patient leaned back into the bed FHT 140s. Position changed to left lateral.

## 2020-02-26 NOTE — MAU Provider Note (Signed)
Chief Complaint  Patient presents with  . Abdominal Pain  . Rupture of Membranes     First Provider Initiated Contact with Patient 02/26/20 1625      S: Julie [name]  is a 24 y.o. y.o. year old G37P2002 female at [redacted]w[redacted]d weeks gestation who presents to MAU reporting leaking of small amount if clear fluid for several weeks that she says she has been evaluated for, but noticed increased leaking since this morning.   Contractions: mild Vaginal bleeding: Denies Fetal movement: Nml  O:  Patient Vitals for the past 24 hrs:  BP Temp Temp src Pulse Resp SpO2  02/26/20 1551 113/63 98.1 F (36.7 C) Oral (!) 123 16 99 %   General: NAD Heart: Regular rate Lungs: Normal rate and effort Abd: Soft, NT, Gravid, S=D Pelvic: NEFG, neg pooling, no blood.   3/thick/-2, vertex  EFM: 135, Moderate variability, 10x10 accelerations, no decelerations Toco: UI  Neg Fern  NST non-reactive after PO hydration position changes. BPP ordered.  BPP 4/10. Recommend IOL. Consulted w. Dr .Despina Hidden who agrees w/ POC.   A: [redacted]w[redacted]d week IUP Non-reactive NST BPP 4/10 No evidence of SROM or active labor FHR reactive  P: Admit to L&D per consult w/. Dr. Despina Hidden. IOL  Katrinka Blazing [place], [place] 02/26/2020 4:30 PM  2

## 2020-02-26 NOTE — H&P (Signed)
OBSTETRIC ADMISSION HISTORY AND PHYSICAL  Julie [name] is a 24 y.o. female G3P2002 with IUP at [redacted]w[redacted]d by 14 wk [place] presenting for IOL for BPP 4/10. She reports +FMs, No LOF, no VB, no blurry vision, headaches or peripheral edema, and RUQ pain.  She plans on breast and bottle feeding. She requests the patch for birth control. She received her [facility].   Sono: 1/20   @[redacted]w[redacted]d , CWD, normal anatomy, cephalic presentation, anterior placental lie, 312g, 33% EFW   Prenatal History/Complications: BPP 7/10 GBS bacteruria Short interval pregnancy Anti-Lewis A Ab Rh neg Anemia  Past Medical History: Past Medical History:  Diagnosis Date  . GERD (gastroesophageal reflux disease)     Past Surgical History: Past Surgical History:  Procedure Laterality Date  . CHOLECYSTECTOMY N/A 01/09/2018   Procedure: LAPAROSCOPIC CHOLECYSTECTOMY WITH INTRAOPERATIVE CHOLANGIOGRAM;  Surgeon: Fanny Skates, MD;  Location: [facility];  Service: General;  Laterality: N/A;    Obstetrical History: OB History    Gravida  3   Para  2   Term  2   Preterm      AB      Living  2     SAB      TAB      Ectopic      Multiple  0   Live Births  2           Social History: Social History   Socioeconomic History  . Marital status: Married    Spouse name: Not on file  . Number of children: 2  . Years of education: Not on file  . Highest education level: High school graduate  Occupational History  . Occupation: [employer]  Tobacco Use  . Smoking status: Never Smoker  . Smokeless tobacco: Never Used  Substance and Sexual Activity  . Alcohol use: No  . Drug use: No  . Sexual activity: Not Currently  Other Topics Concern  . Not on file  Social History Narrative  . Not on file   Social Determinants of Health   Financial Resource Strain:   . Difficulty of Paying Living Expenses:   Food Insecurity:   . Worried About Charity fundraiser in the Last Year:   .  Arboriculturist in the Last Year:   Transportation Needs:   . Film/video editor (Medical):   Marland Kitchen Lack of Transportation (Non-Medical):   Physical Activity:   . Days of Exercise per Week:   . Minutes of Exercise per Session:   Stress:   . Feeling of Stress :   Social Connections:   . Frequency of Communication with Friends and Family:   . Frequency of Social Gatherings with Friends and Family:   . Attends Religious Services:   . Active Member of Clubs or Organizations:   . Attends Archivist Meetings:   Marland Kitchen Marital Status:     Family History: Family History  Problem Relation Age of Onset  . Diabetes Mother   . Pancreatic cancer Father   . Cancer Maternal Grandfather     Allergies: No Known Allergies  Medications Prior to Admission  Medication Sig Dispense Refill Last Dose  . acetaminophen (TYLENOL) 500 MG tablet Take 1,000 mg by mouth every 6 (six) hours as needed for moderate pain.     . Blood Pressure Monitoring (BLOOD PRESSURE MONITOR AUTOMAT) DEVI 1 Device by Does not apply route daily. Automatic blood pressure cuff regular size. To monitor blood pressure regularly at  home. ICD-10 code:Z34.90 1 each 0   . cyclobenzaprine (FLEXERIL) 10 MG tablet Take 1 tablet (10 mg total) by mouth at bedtime as needed for muscle spasms. (Patient not taking: Reported on 01/15/2020) 30 tablet 0   . Elastic Bandages & Supports (COMFORT FIT MATERNITY SUPP SM) MISC 1 Units by Does not apply route daily as needed. 1 each 0   . ferrous sulfate (FERROUSUL) 325 (65 FE) MG tablet Take 1 tablet (325 mg total) by mouth 2 (two) times daily with a meal. (Patient not taking: Reported on 01/15/2020) 100 tablet 3   . Misc. Devices (GOJJI WEIGHT SCALE) MISC 1 Device by Does not apply route daily as needed. To weight self daily as needed at home. ICD-10 code: O09.90 1 each 0   . polyethylene glycol (MIRALAX / GLYCOLAX) 17 g packet Take 17 g by mouth daily. (Patient not taking: Reported on 01/15/2020) 14  each 3   . Prenatal MV-Min-FA-Omega-3 (PRENATAL GUMMIES/DHA & FA) 0.4-32.5 MG CHEW Chew 1 Dose by mouth daily. (Patient not taking: Reported on 10/02/2019) 30 tablet 1      Review of Systems   All systems reviewed and negative except as stated in HPI  Blood pressure 126/79, pulse (!) 105, temperature 98.1 F (36.7 C), temperature source Oral, resp. rate 17, height 5\' 5"  (1.651 m), weight 70.8 kg, last menstrual period 05/28/2019, SpO2 99 %, unknown if currently breastfeeding. General appearance: alert, cooperative and appears stated age Lungs: normal effort Heart: regular rate  Abdomen: soft, non-tender; bowel sounds normal Pelvic: gravid uterus GU: No vaginal lesions  Extremities: Homans sign is negative, no sign of DVT Presentation: cephalic Fetal monitoringBaseline: 130 bpm, Variability: Good {> 6 bpm), Accelerations: Reactive and Decelerations: Absent Uterine activity: None Dilation: 3 Effacement (%): Thick Station: -2 Exam by:: smith cnm   Prenatal labs: ABO, Rh: --/--/PENDING (05/26 1921) Antibody: PENDING (05/26 1921) Rubella: 4.54 (12/30 1522) RPR: Non Reactive (03/18 0834)  HBsAg: Negative (12/30 1522)  HIV: Non Reactive (03/18 0834)  GBS: Negative/-- (06/30 0000)  2 hr Glucola WNL Genetic screening  Low risk Anatomy 07[birth year]07[birth year]1984 WNL  Prenatal Transfer Tool  Maternal Diabetes: No Genetic Screening: Normal Maternal Ultrasounds/Referrals: Normal Fetal Ultrasounds or other Referrals:  None Maternal Substance Abuse:  No Significant Maternal Medications:  None Significant Maternal Lab Results: Group B Strep positive and Rh negative  Results for orders placed or performed during the hospital encounter of 02/26/20 (from the past 24 hour(s))  CBC   Collection Time: 02/26/20  7:21 PM  Result Value Ref Range   WBC 8.0 4.0 - 10.5 K/uL   RBC 3.81 (L) 3.87 - 5.11 MIL/uL   Hemoglobin 9.4 (L) 12.0 - 15.0 g/dL   HCT 02/28/20 (L) 09.4 - 70.9 %   MCV 81.1 80.0 - 100.0 fL   MCH 24.7  (L) 26.0 - 34.0 pg   MCHC 30.4 30.0 - 36.0 g/dL   RDW 62.8 (H) 36.6 - 29.4 %   Platelets 261 150 - 400 K/uL   nRBC 0.0 0.0 - 0.2 %  Type and screen [facility]   Collection Time: 02/26/20  7:21 PM  Result Value Ref Range   ABO/RH(D) PENDING    Antibody Screen PENDING    Sample Expiration      02/29/2020,2359 Performed at [facility], [address], [place], [place]     Patient Active Problem List   Diagnosis Date Noted  . Non-stress test nonreactive 02/26/2020  . GBS  bacteriuria 12/18/2019  . Supervision of other normal pregnancy, antepartum 09/16/2019  . Gallstone pancreatitis 12/26/2017    Assessment/Plan:  Julie [name] is a 24 y.o. G3P2002 at [redacted]w[redacted]d here for IOL for BPP 4/10.  #Labor: Vertex by exam. Will start Pitocin. Consider AROM after GBS ppx. Anticipate SVD. #Pain: Per patient request #FWB: Cat I; EFW: 3100g #ID:  GBS pos; PCN #MOF: Both #MOC: Patch #Circ:  NA; girl #Anemia: Hgb 9.4 on admission. No hx PPH. Cont PO iron post-partum.  Jerilynn Birkenhead, MD [facility] Family Medicine Fellow, [facility] for [facility], [facility] 02/26/2020, 8:36 PM

## 2020-02-27 ENCOUNTER — Encounter: Payer: Medicaid Other | Admitting: Obstetrics and Gynecology

## 2020-02-27 ENCOUNTER — Inpatient Hospital Stay (HOSPITAL_COMMUNITY): Payer: Medicaid Other | Admitting: Anesthesiology

## 2020-02-27 ENCOUNTER — Encounter (HOSPITAL_COMMUNITY): Payer: Self-pay | Admitting: Obstetrics & Gynecology

## 2020-02-27 DIAGNOSIS — O288 Other abnormal findings on antenatal screening of mother: Secondary | ICD-10-CM

## 2020-02-27 DIAGNOSIS — Z3A38 38 weeks gestation of pregnancy: Secondary | ICD-10-CM

## 2020-02-27 LAB — RPR: RPR Ser Ql: NONREACTIVE

## 2020-02-27 MED ORDER — FENTANYL-BUPIVACAINE-NACL 0.5-0.125-0.9 MG/250ML-% EP SOLN: 12.0000 mL/h | EPIDURAL | Status: DC | PRN

## 2020-02-27 MED ORDER — SENNOSIDES-DOCUSATE SODIUM 8.6-50 MG PO TABS
2.0000 | ORAL_TABLET | ORAL | Status: DC
  Administered 2020-02-28: 2 via ORAL
  Filled 2020-02-27: qty 2

## 2020-02-27 MED ORDER — FENTANYL CITRATE (PF) 100 MCG/2ML IJ SOLN
100.0000 ug | Freq: Once | INTRAMUSCULAR | Status: AC
  Administered 2020-02-27: 100 ug via EPIDURAL
  Filled 2020-02-27: qty 2

## 2020-02-27 MED ORDER — EPHEDRINE 5 MG/ML INJ: 10.0000 mg | INTRAVENOUS | Status: DC | PRN

## 2020-02-27 MED ORDER — LIDOCAINE-EPINEPHRINE 2 %-1:100000 IJ SOLN
INTRAMUSCULAR | Status: DC | PRN
Start: 2020-02-27 — End: 2020-02-27

## 2020-02-27 MED ORDER — LIDOCAINE HCL (PF) 1 % IJ SOLN
INTRAMUSCULAR | Status: DC | PRN
  Administered 2020-02-27 (×2): 6 mL via EPIDURAL

## 2020-02-27 MED ORDER — SIMETHICONE 80 MG PO CHEW: 80.0000 mg | CHEWABLE_TABLET | ORAL | Status: DC | PRN

## 2020-02-27 MED ORDER — DIPHENHYDRAMINE HCL 50 MG/ML IJ SOLN: 12.5000 mg | INTRAMUSCULAR | Status: DC | PRN

## 2020-02-27 MED ORDER — FENTANYL CITRATE (PF) 100 MCG/2ML IJ SOLN
INTRAMUSCULAR | Status: DC | PRN
  Administered 2020-02-27: 100 ug via EPIDURAL

## 2020-02-27 MED ORDER — MEASLES, MUMPS & RUBELLA VAC IJ SOLR: 0.5000 mL | Freq: Once | INTRAMUSCULAR | Status: DC

## 2020-02-27 MED ORDER — RHO D IMMUNE GLOBULIN 1500 UNIT/2ML IJ SOSY
300.0000 ug | PREFILLED_SYRINGE | Freq: Once | INTRAMUSCULAR | Status: DC
  Filled 2020-02-27: qty 2

## 2020-02-27 MED ORDER — LACTATED RINGERS IV SOLN: 500.0000 mL | Freq: Once | INTRAVENOUS | Status: DC

## 2020-02-27 MED ORDER — WITCH HAZEL-GLYCERIN EX PADS: 1.0000 "application " | MEDICATED_PAD | CUTANEOUS | Status: DC | PRN

## 2020-02-27 MED ORDER — PHENYLEPHRINE 40 MCG/ML (10ML) SYRINGE FOR IV PUSH (FOR BLOOD PRESSURE SUPPORT)
80.0000 ug | PREFILLED_SYRINGE | INTRAVENOUS | Status: DC | PRN
  Filled 2020-02-27: qty 10

## 2020-02-27 MED ORDER — PRENATAL MULTIVITAMIN CH
1.0000 | ORAL_TABLET | Freq: Every day | ORAL | Status: DC
  Administered 2020-02-27: 1 via ORAL
  Filled 2020-02-27: qty 1

## 2020-02-27 MED ORDER — ACETAMINOPHEN 325 MG PO TABS
650.0000 mg | ORAL_TABLET | Freq: Four times a day (QID) | ORAL | Status: DC | PRN
  Administered 2020-02-27 – 2020-02-28 (×2): 650 mg via ORAL
  Filled 2020-02-27 (×2): qty 2

## 2020-02-27 MED ORDER — COCONUT OIL OIL: 1.0000 "application " | TOPICAL_OIL | Status: DC | PRN

## 2020-02-27 MED ORDER — ONDANSETRON HCL 4 MG/2ML IJ SOLN: 4.0000 mg | INTRAMUSCULAR | Status: DC | PRN

## 2020-02-27 MED ORDER — BENZOCAINE-MENTHOL 20-0.5 % EX AERO: 1.0000 "application " | INHALATION_SPRAY | CUTANEOUS | Status: DC | PRN

## 2020-02-27 MED ORDER — FERROUS SULFATE 325 (65 FE) MG PO TABS
325.0000 mg | ORAL_TABLET | Freq: Every day | ORAL | Status: DC
  Administered 2020-02-27 – 2020-02-28 (×2): 325 mg via ORAL
  Filled 2020-02-27 (×2): qty 1

## 2020-02-27 MED ORDER — DIPHENHYDRAMINE HCL 25 MG PO CAPS: 25.0000 mg | ORAL_CAPSULE | Freq: Four times a day (QID) | ORAL | Status: DC | PRN

## 2020-02-27 MED ORDER — SODIUM CHLORIDE (PF) 0.9 % IJ SOLN
INTRAMUSCULAR | Status: DC | PRN
  Administered 2020-02-27: 12 mL/h via EPIDURAL

## 2020-02-27 MED ORDER — IBUPROFEN 600 MG PO TABS
600.0000 mg | ORAL_TABLET | Freq: Three times a day (TID) | ORAL | Status: DC | PRN
  Administered 2020-02-27 (×2): 600 mg via ORAL
  Filled 2020-02-27: qty 1

## 2020-02-27 MED ORDER — TETANUS-DIPHTH-ACELL PERTUSSIS 5-2.5-18.5 LF-MCG/0.5 IM SUSP: 0.5000 mL | Freq: Once | INTRAMUSCULAR | Status: DC

## 2020-02-27 MED ORDER — DIBUCAINE (PERIANAL) 1 % EX OINT: 1.0000 "application " | TOPICAL_OINTMENT | CUTANEOUS | Status: DC | PRN

## 2020-02-27 MED ORDER — ONDANSETRON HCL 4 MG PO TABS: 4.0000 mg | ORAL_TABLET | ORAL | Status: DC | PRN

## 2020-02-27 MED ORDER — FENTANYL-BUPIVACAINE-NACL 0.5-0.125-0.9 MG/250ML-% EP SOLN
12.0000 mL/h | EPIDURAL | Status: DC | PRN
  Filled 2020-02-27: qty 250

## 2020-02-27 MED ORDER — PHENYLEPHRINE 40 MCG/ML (10ML) SYRINGE FOR IV PUSH (FOR BLOOD PRESSURE SUPPORT): 80.0000 ug | PREFILLED_SYRINGE | INTRAVENOUS | Status: DC | PRN

## 2020-02-27 NOTE — Anesthesia Preprocedure Evaluation (Signed)
Anesthesia Evaluation  Patient identified by MRN, date of birth, ID band Patient awake    Reviewed: Allergy & Precautions, H&P , NPO status , Patient's Chart, lab work & pertinent test results, reviewed documented beta blocker date and time   Airway Mallampati: I  TM Distance: >3 FB Neck ROM: full    Dental no notable dental hx. (+) Dental Advisory Given, Teeth Intact   Pulmonary neg pulmonary ROS,    Pulmonary exam normal breath sounds clear to auscultation       Cardiovascular negative cardio ROS Normal cardiovascular exam Rhythm:regular Rate:Normal     Neuro/Psych negative neurological ROS  negative psych ROS   GI/Hepatic Neg liver ROS, GERD  ,  Endo/Other  negative endocrine ROS  Renal/GU negative Renal ROS  negative genitourinary   Musculoskeletal   Abdominal   Peds  Hematology  (+) Blood dyscrasia, anemia ,   Anesthesia Other Findings   Reproductive/Obstetrics (+) Pregnancy                             Anesthesia Physical Anesthesia Plan  ASA: II  Anesthesia Plan: Epidural   Post-op Pain Management:    Induction:   PONV Risk Score and Plan:   Airway Management Planned:   Additional Equipment:   Intra-op Plan:   Post-operative Plan:   Informed Consent: I have reviewed the patients History and Physical, chart, labs and discussed the procedure including the risks, benefits and alternatives for the proposed anesthesia with the patient or authorized representative who has indicated his/her understanding and acceptance.     Dental Advisory Given  Plan Discussed with:   Anesthesia Plan Comments: (Labs checked- platelets confirmed with RN in room. Fetal heart tracing, per RN, reported to be stable enough for sitting procedure. Discussed epidural, and patient consents to the procedure:  included risk of possible headache,backache, failed block, allergic reaction, and nerve  injury. This patient was asked if she had any questions or concerns before the procedure started.)        Anesthesia Quick Evaluation

## 2020-02-27 NOTE — Discharge Summary (Addendum)
Postpartum Discharge Summary     Patient Name: Julie [name] DOB: September 29, 1996 MRN: [id number]  Date of admission: 02/26/2020 Delivery date:02/27/2020  Delivering provider: Chauncey Mann  Date of discharge: 02/28/2020  Admitting diagnosis: Non-stress test nonreactive [O28.8] Intrauterine pregnancy: [redacted]w[redacted]d    Secondary diagnosis:  Active Problems:   GBS bacteriuria   Non-stress test nonreactive   Anemia of pregnancy in third trimester  Additional problems: None    Discharge diagnosis: Term Pregnancy Delivered                                              Post partum procedures: None Augmentation: AROM and Pitocin Complications: None  Hospital course: Induction of Labor With Vaginal Delivery   24y.o. yo [id number] admitted to the hospital 02/26/2020 for induction of labor.  Indication for induction:  BPP 4/10 .  Patient had an uncomplicated labor course as follows: Initial SVE: 2.5/40/-2. Patient received Pitocin, AROM and epidural. She then progressed to complete.  Membrane Rupture Time/Date: 12:49 AM ,02/27/2020   Delivery Method:Vaginal, Spontaneous  Episiotomy: None  Lacerations:  1st degree  Details of delivery can be found in separate delivery note.  Patient had a routine postpartum course. Patient is discharged home 02/28/20.  Newborn Data: Birth date:02/27/2020  Birth time:3:32 AM  Gender:Female  Living status:Living  Apgars:8 ,9  Weight:3011 g   Magnesium Sulfate received: No BMZ received: No Rhophylac:none MMR:No T-DaP: declined Flu: No Transfusion:No  Physical exam  Vitals:   02/27/20 1428 02/27/20 1841 02/27/20 2159 02/28/20 0543  BP: 116/78 109/82 124/84 109/73  Pulse: (!) 105 (!) 101 90 95  Resp: 18 18  16   Temp:  98.2 F (36.8 C) 98.5 F (36.9 C) 98.6 F (37 C)  TempSrc:  Oral Oral Oral  SpO2: 98% 100% 100% 100%  Weight:      Height:       General: alert, cooperative and no distress Lochia: appropriate Uterine Fundus:  firm Incision: N/A DVT Evaluation: No evidence of DVT seen on physical exam. Labs: Lab Results  Component Value Date   WBC 8.0 02/26/2020   HGB 9.4 (L) 02/26/2020   HCT 30.9 (L) 02/26/2020   MCV 81.1 02/26/2020   PLT 261 02/26/2020   CMP Latest Ref Rng & Units 10/02/2019  Glucose 65 - 99 mg/dL 78  BUN 6 - 20 mg/dL -  Creatinine 0.44 - 1.00 mg/dL -  Sodium 135 - 145 mmol/L -  Potassium 3.5 - 5.1 mmol/L -  Chloride 101 - 111 mmol/L -  CO2 22 - 32 mmol/L -  Calcium 8.9 - 10.3 mg/dL -  Total Protein 6.5 - 8.1 g/dL -  Total Bilirubin 0.3 - 1.2 mg/dL -  Alkaline Phos 38 - 126 U/L -  AST 15 - 41 U/L -  ALT 14 - 54 U/L -   Edinburgh Score: Edinburgh Postnatal Depression Scale Screening Tool 05/21/2019  I have been able to laugh and see the funny side of things. 0  I have looked forward with enjoyment to things. 0  I have blamed myself unnecessarily when things went wrong. 0  I have been anxious or worried for no good reason. 0  I have felt scared or panicky for no good reason. 0  Things have been getting on top of me. 0  I have been so unhappy  that I have had difficulty sleeping. 0  I have felt sad or miserable. 0  I have been so unhappy that I have been crying. 0  The thought of harming myself has occurred to me. 0  Edinburgh Postnatal Depression Scale Total 0     After visit meds:  Allergies as of 02/28/2020   No Known Allergies      Medication List     STOP taking these medications    cyclobenzaprine 10 MG tablet Commonly known as: FLEXERIL   polyethylene glycol 17 g packet Commonly known as: MIRALAX / GLYCOLAX       TAKE these medications    acetaminophen 500 MG tablet Commonly known as: TYLENOL Take 1,000 mg by mouth every 6 (six) hours as needed for moderate pain.   Blood Pressure Monitor Automat Devi 1 Device by Does not apply route daily. Automatic blood pressure cuff regular size. To monitor blood pressure regularly at home. ICD-10 code:Z34.90    [place] 1 Units by Does not apply route daily as needed.   ferrous sulfate 325 (65 FE) MG tablet Take 1 tablet (325 mg total) by mouth daily with breakfast. What changed: when to take this   [place] Weight Scale Misc 1 Device by Does not apply route daily as needed. To weight self daily as needed at home. ICD-10 code: O09.90   ibuprofen 600 MG tablet Commonly known as: ADVIL Take 1 tablet (600 mg total) by mouth every 8 (eight) hours as needed for mild pain.   Prenatal Gummies/DHA & FA 0.4-32.5 MG Chew Chew 1 Dose by mouth daily.         Discharge home in stable condition Infant Feeding: Breast Infant Disposition:home with mother Discharge instruction: per After Visit Summary and Postpartum booklet. Activity: Advance as tolerated. Pelvic rest for 6 weeks.  Diet: routine diet Future Appointments: Future Appointments  Date Time Provider [place]  04/02/2020  2:30 PM Laury Deep, CNM [facility] None   Follow up Visit:    Please schedule this patient for a Virtual postpartum visit in 4 weeks with the following provider: Any provider. Additional Postpartum F/U: none   Low risk pregnancy complicated by:  none Delivery mode:  Vaginal, Spontaneous  Anticipated Birth Control:   patch  to start following PP visit   02/28/2020 Matilde Haymaker, MD  I personally saw and evaluated the patient, performing the key elements of the service. I developed and verified the management plan that is described in the resident's/student's note, and I agree with the content with my edits above. VSS, HRR&R, Resp unlabored, Legs neg.  Nigel Berthold, CNM 03/01/2020 10:28 AM

## 2020-02-27 NOTE — Anesthesia Postprocedure Evaluation (Signed)
Anesthesia Post Note  Patient: Julie [name]  Procedure(s) Performed: AN AD HOC LABOR EPIDURAL     Patient location during evaluation: Mother Baby Anesthesia Type: Epidural Level of consciousness: awake and alert Pain management: pain level controlled Vital Signs Assessment: post-procedure vital signs reviewed and stable Respiratory status: spontaneous breathing, nonlabored ventilation and respiratory function stable Cardiovascular status: stable Postop Assessment: no headache, no backache and epidural receding Anesthetic complications: no    Last Vitals:  Vitals:   02/27/20 0530 02/27/20 0630  BP: (!) 106/56 108/67  Pulse: 89 96  Resp: 18 18  Temp: 37 C 36.9 C  SpO2: 100% 99%    Last Pain:  Vitals:   02/27/20 0630  TempSrc: Oral  PainSc: 0-No pain   Pain Goal:                   [name]

## 2020-02-27 NOTE — Progress Notes (Signed)
Labor Progress Note Julie [name] is a 24 y.o. G3P2002 at [redacted]w[redacted]d presented for IOL for BPP 4/10. S: Feeling some cramping but overall comfortable.   O:  BP 110/60   Pulse (!) 103   Temp 98.7 F (37.1 C)   Resp 16   Ht 5\' 5"  (1.651 m)   Wt 70.8 kg   LMP 05/28/2019   SpO2 99% Comment: room air  BMI 25.96 kg/m  EFM: 135, moderate variability, pos accels, no decels, reactive TOCO: q3-55m  CVE: Dilation: 3.5 Effacement (%): 60 Cervical Position: Middle Station: -2 Presentation: Vertex Exam by:: Dr. [email or web address]   A&P: 24 y.o. 36 [redacted]w[redacted]d here for IOL for BPP 4/10. #Labor: Progressing well. Cont Pit. AROM with clear fluid at this exam. Anticipate SVD. #Pain: per patient request #FWB: Cat I #GBS positive; PCN  6/10, MD 12:52 AM

## 2020-02-27 NOTE — Anesthesia Procedure Notes (Signed)
Epidural Patient location during procedure: OB Start time: 02/27/2020 1:19 AM End time: 02/27/2020 1:25 AM  Staffing Anesthesiologist: Bethena Midget, MD  Preanesthetic Checklist Completed: patient identified, IV checked, site marked, risks and benefits discussed, surgical consent, monitors and equipment checked, pre-op evaluation and timeout performed  Epidural Patient position: sitting Prep: DuraPrep and site prepped and draped Patient monitoring: continuous pulse ox and blood pressure Approach: midline Location: L3-L4 Injection technique: LOR air  Needle:  Needle type: Tuohy  Needle gauge: 17 G Needle length: 9 cm and 9 Needle insertion depth: 5 cm cm Catheter type: closed end flexible Catheter size: 19 Gauge Catheter at skin depth: 10 cm Test dose: negative  Assessment Events: blood not aspirated, injection not painful, no injection resistance, no paresthesia and negative IV test

## 2020-02-28 MED ORDER — FERROUS SULFATE 325 (65 FE) MG PO TABS: 325.0000 mg | ORAL_TABLET | Freq: Every day | ORAL | 0 refills | Status: DC

## 2020-02-28 MED ORDER — IBUPROFEN 600 MG PO TABS: 600.0000 mg | ORAL_TABLET | Freq: Three times a day (TID) | ORAL | 0 refills | Status: DC | PRN

## 2020-02-29 LAB — TYPE AND SCREEN
ABO/RH(D): O POS
Antibody Screen: POSITIVE
Donor AG Type: NEGATIVE
Donor AG Type: NEGATIVE
Unit division: 0
Unit division: 0

## 2020-02-29 LAB — BPAM RBC
Blood Product Expiration Date: 202106172359
Blood Product Expiration Date: 202106232359
ISSUE DATE / TIME: 202105170827
Unit Type and Rh: 5100
Unit Type and Rh: 5100

## 2020-03-03 ENCOUNTER — Encounter: Payer: Self-pay | Admitting: General Practice

## 2020-04-02 ENCOUNTER — Telehealth: Payer: Medicaid Other | Admitting: Obstetrics and Gynecology

## 2020-06-19 ENCOUNTER — Other Ambulatory Visit: Payer: Self-pay

## 2020-06-19 ENCOUNTER — Ambulatory Visit (HOSPITAL_COMMUNITY)
Admission: EM | Admit: 2020-06-19 | Discharge: 2020-06-19 | Disposition: A | Discharge status: Home / Self Care | Payer: Medicaid Other

## 2021-02-08 ENCOUNTER — Other Ambulatory Visit: Payer: Self-pay

## 2021-02-08 ENCOUNTER — Ambulatory Visit (INDEPENDENT_AMBULATORY_CARE_PROVIDER_SITE_OTHER): Payer: Medicaid Other | Admitting: *Deleted

## 2021-02-08 DIAGNOSIS — Z3480 Encounter for supervision of other normal pregnancy, unspecified trimester: Secondary | ICD-10-CM | POA: Insufficient documentation

## 2021-02-08 DIAGNOSIS — Z3201 Encounter for pregnancy test, result positive: Secondary | ICD-10-CM

## 2021-02-08 LAB — POCT URINE PREGNANCY: Preg Test, Ur: POSITIVE — AB

## 2021-02-08 NOTE — Progress Notes (Signed)
   Location: [facility] Patient: clinic Provider: clinic  PRENATAL INTAKE SUMMARY  Ms. Keeven presents today New OB Nurse Interview.  OB History    Gravida  4   Para  3   Term  3   Preterm      AB      Living  3     SAB      IAB      Ectopic      Multiple  0   Live Births  3          I have reviewed the patient's medical, obstetrical, social, and family histories, medications, and available lab results.  SUBJECTIVE She has no unusual complaints.  OBJECTIVE Initial Nurse interview for history (New OB)  EDD: 10/12/20 by LMP GA: [redacted]w[redacted]d G4P3003 FHT: not assessed  GENERAL APPEARANCE: alert, well appearing, in no apparent distress, oriented to person, place and time   ASSESSMENT Positive UPT Normal pregnancy  PLAN Prenatal care:  [facility] [place] [facility] Labs to be completed at next visit with Raelyn Mora, CNM  Follow Up Instructions:   I discussed the assessment and treatment plan with the patient. The patient was provided an opportunity to ask questions and all were answered. The patient agreed with the plan and demonstrated an understanding of the instructions.   The patient was advised to call back or seek an in-person evaluation if the symptoms worsen or if the condition fails to improve as anticipated.  I provided 30 minutes of  face-to-face time during this encounter.  Clovis Pu, RN

## 2021-02-21 ENCOUNTER — Inpatient Hospital Stay (HOSPITAL_COMMUNITY): Payer: Medicaid Other

## 2021-02-21 ENCOUNTER — Encounter (HOSPITAL_COMMUNITY): Payer: Self-pay | Admitting: Obstetrics and Gynecology

## 2021-02-21 ENCOUNTER — Inpatient Hospital Stay (HOSPITAL_COMMUNITY)
Admission: AD | Admit: 2021-02-21 | Discharge: 2021-02-21 | Disposition: A | Discharge status: Home / Self Care | Payer: Medicaid Other | Attending: Obstetrics and Gynecology | Admitting: Obstetrics and Gynecology

## 2021-02-21 ENCOUNTER — Other Ambulatory Visit: Payer: Self-pay

## 2021-02-21 DIAGNOSIS — Z3A01 Less than 8 weeks gestation of pregnancy: Secondary | ICD-10-CM | POA: Insufficient documentation

## 2021-02-21 DIAGNOSIS — O208 Other hemorrhage in early pregnancy: Secondary | ICD-10-CM | POA: Insufficient documentation

## 2021-02-21 DIAGNOSIS — R109 Unspecified abdominal pain: Secondary | ICD-10-CM | POA: Diagnosis not present

## 2021-02-21 DIAGNOSIS — R1032 Left lower quadrant pain: Secondary | ICD-10-CM | POA: Diagnosis not present

## 2021-02-21 DIAGNOSIS — R519 Headache, unspecified: Secondary | ICD-10-CM | POA: Insufficient documentation

## 2021-02-21 DIAGNOSIS — Z3491 Encounter for supervision of normal pregnancy, unspecified, first trimester: Secondary | ICD-10-CM

## 2021-02-21 DIAGNOSIS — O26891 Other specified pregnancy related conditions, first trimester: Secondary | ICD-10-CM | POA: Diagnosis not present

## 2021-02-21 LAB — CBC
HCT: 33.5 % — ABNORMAL LOW (ref 36.0–46.0)
Hemoglobin: 10.8 g/dL — ABNORMAL LOW (ref 12.0–15.0)
MCH: 25.9 pg — ABNORMAL LOW (ref 26.0–34.0)
MCHC: 32.2 g/dL (ref 30.0–36.0)
MCV: 80.3 fL (ref 80.0–100.0)
Platelets: 305 10*3/uL (ref 150–400)
RBC: 4.17 MIL/uL (ref 3.87–5.11)
RDW: 16.3 % — ABNORMAL HIGH (ref 11.5–15.5)
WBC: 5.8 10*3/uL (ref 4.0–10.5)
nRBC: 0 % (ref 0.0–0.2)

## 2021-02-21 LAB — HCG, QUANTITATIVE, PREGNANCY: hCG, Beta Chain, Quant, S: 77949 m[IU]/mL — ABNORMAL HIGH (ref <5)

## 2021-02-21 MED ORDER — ACETAMINOPHEN 500 MG PO TABS
1000.0000 mg | ORAL_TABLET | Freq: Once | ORAL | Status: AC
  Administered 2021-02-21: 1000 mg via ORAL
  Filled 2021-02-21: qty 2

## 2021-02-21 NOTE — Discharge Instructions (Signed)
General Headache Without Cause A headache is pain or discomfort felt around the head or neck area. The specific cause of a headache may not be found. There are many causes and types of headaches. A few common ones are:  Tension headaches.  Migraine headaches.  Cluster headaches.  Chronic daily headaches. Follow these instructions at home: Watch your condition for any changes. Let your health care provider know about them. Take these steps to help with your condition: Managing pain  Take over-the-counter and prescription medicines only as told by your health care provider.  Lie down in a dark, quiet room when you have a headache.  If directed, put ice on your head and neck area: ? Put ice in a plastic bag. ? Place a towel between your skin and the bag. ? Leave the ice on for 20 minutes, 2-3 times per day.  If directed, apply heat to the affected area. Use the heat source that your health care provider recommends, such as a moist heat pack or a heating pad. ? Place a towel between your skin and the heat source. ? Leave the heat on for 20-30 minutes. ? Remove the heat if your skin turns bright red. This is especially important if you are unable to feel pain, heat, or cold. You may have a greater risk of getting burned.  Keep lights dim if bright lights bother you or make your headaches worse.      Eating and drinking  Eat meals on a regular schedule.  If you drink alcohol: ? Limit how much you use to:  0-1 drink a day for women.  0-2 drinks a day for men. ? Be aware of how much alcohol is in your drink. In the [place] one drink equals one 12 oz bottle of beer (355 mL), one 5 oz glass of wine (148 mL), or one 1 oz glass of hard liquor (44 mL).  Stop drinking caffeine, or decrease the amount of caffeine you drink. General instructions  Keep a headache journal to help find out what may trigger your headaches. For example, write down: ? What you eat and drink. ? How much sleep  you get. ? Any change to your diet or medicines.  Try massage or other relaxation techniques.  Limit stress.  Sit up straight, and do not tense your muscles.  Do not use any products that contain nicotine or tobacco, such as cigarettes, e-cigarettes, and chewing tobacco. If you need help quitting, ask your health care provider.  Exercise regularly as told by your health care provider.  Sleep on a regular schedule. Get 7-9 hours of sleep each night, or the amount recommended by your health care provider.  Keep all follow-up visits as told by your health care provider. This is important.   Contact a health care provider if:  Your symptoms are not helped by medicine.  You have a headache that is different from the usual headache.  You have nausea or you vomit.  You have a fever. Get help right away if:  Your headache becomes severe quickly.  Your headache gets worse after moderate to intense physical activity.  You have repeated vomiting.  You have a stiff neck.  You have a loss of vision.  You have problems with speech.  You have pain in the eye or ear.  You have muscular weakness or loss of muscle control.  You lose your balance or have trouble walking.  You feel faint or pass out.  You have   confusion.  You have a seizure. Summary  A headache is pain or discomfort felt around the head or neck area.  There are many causes and types of headaches. In some cases, the cause may not be found.  Keep a headache journal to help find out what may trigger your headaches. Watch your condition for any changes. Let your health care provider know about them.  Contact a health care provider if you have a headache that is different from the usual headache, or if your symptoms are not helped by medicine.  Get help right away if your headache becomes severe, you vomit, you have a loss of vision, you lose your balance, or you have a seizure. This information is not intended to  replace advice given to you by your health care provider. Make sure you discuss any questions you have with your health care provider. Document Revised: 04/09/2018 Document Reviewed: 04/09/2018 Elsevier Patient Education  2021 Elsevier Inc.  

## 2021-02-21 NOTE — MAU Note (Signed)
Pt is a G6P3 at 6.5 dated by unsure period.  She reports sharp pain when stretching that started this morning with headache.  She has not taken anything for it or tried to resolve either issue.  NO other medical or OB concerns.

## 2021-02-21 NOTE — MAU Provider Note (Signed)
History     CSN: [id number]  Arrival date and time: 02/21/21 1759   Event Date/Time   First Provider Initiated Contact with Patient 02/21/21 1851      Chief Complaint  Patient presents with  . Abdominal Pain   HPI Julie [name] is a 25 y.o. G4P3003 at [redacted]w[redacted]d who presents with abdominal pain. Reports LLQ pain that occurs when she stands up & stretches. Rates pain 7/10 when it occurs & describes it as sharp & aching. No associated symptoms.  Also reports a headache. She has history of headaches that she normally treats with hydration & tylenol. States current headache is just like her other headaches. Has not taken tylenol today. Denies fever, n/v/d, dysuria, vaginal bleeding, or vaginal discharge.   OB History    Gravida  4   Para  3   Term  3   Preterm      AB      Living  3     SAB      IAB      Ectopic      Multiple  0   Live Births  3           Past Medical History:  Diagnosis Date  . GERD (gastroesophageal reflux disease)   . Supervision of other normal pregnancy, antepartum 09/16/2019    Nursing Staff Provider Office Location  [facility] Dating  Ultrasound Language  English Anatomy US  Normal, isolated EIF  Flu Vaccine  Declined Genetic Screen  NIPS:   Low risk girl AFP:   Screen negative  TDaP vaccine   Declined Hgb A1C or  GTT Early  Third trimester    Ref Range & Units 1 d ago  Glucose, Fasting 65 - 91 mg/dL 76   Glucose, 1 hour 65 - 179 mg/dL 527   Glucose, 2 hour 65 - 152 m    Past Surgical History:  Procedure Laterality Date  . CHOLECYSTECTOMY N/A 01/09/2018   Procedure: LAPAROSCOPIC CHOLECYSTECTOMY WITH INTRAOPERATIVE CHOLANGIOGRAM;  Surgeon: Claud Kelp, MD;  Location: [facility];  Service: General;  Laterality: N/A;    Family History  Problem Relation Age of Onset  . Diabetes Mother   . Pancreatic cancer Father   . Cancer Maternal Grandfather     Social History   Tobacco Use  . Smoking status: Never Smoker  .  Smokeless tobacco: Never Used  Vaping Use  . Vaping Use: Never used  Substance Use Topics  . Alcohol use: No  . Drug use: No    Allergies: No Known Allergies  Medications Prior to Admission  Medication Sig Dispense Refill Last Dose  . acetaminophen (TYLENOL) 500 MG tablet Take 1,000 mg by mouth every 6 (six) hours as needed for moderate pain. (Patient not taking: Reported on 02/08/2021)     . Blood Pressure Monitoring (BLOOD PRESSURE MONITOR AUTOMAT) DEVI 1 Device by Does not apply route daily. Automatic blood pressure cuff regular size. To monitor blood pressure regularly at home. ICD-10 code:Z34.90 (Patient not taking: Reported on 02/08/2021) 1 each 0   . Elastic Bandages & Supports (COMFORT FIT MATERNITY SUPP SM) MISC 1 Units by Does not apply route daily as needed. (Patient not taking: Reported on 02/08/2021) 1 each 0   . ferrous sulfate 325 (65 FE) MG tablet Take 1 tablet (325 mg total) by mouth daily with breakfast. (Patient not taking: Reported on 02/08/2021) 30 tablet 0   . ibuprofen (ADVIL) 600 MG tablet Take 1 tablet (600 mg  total) by mouth every 8 (eight) hours as needed for mild pain. (Patient not taking: Reported on 02/08/2021) 30 tablet 0   . Misc. Devices (GOJJI WEIGHT SCALE) MISC 1 Device by Does not apply route daily as needed. To weight self daily as needed at home. ICD-10 code: O09.90 1 each 0   . Prenatal MV-Min-FA-Omega-3 (PRENATAL GUMMIES/DHA & FA) 0.4-32.5 MG CHEW Chew 1 Dose by mouth daily. (Patient not taking: Reported on 02/08/2021) 30 tablet 1     Review of Systems  Constitutional: Negative.   Gastrointestinal: Positive for abdominal pain. Negative for constipation, diarrhea, nausea and vomiting.  Neurological: Positive for headaches.   Physical Exam   Blood pressure 103/66, pulse 94, temperature 98.2 F (36.8 C), temperature source Oral, resp. rate 17, last menstrual period 01/05/2021, SpO2 100 %, not currently breastfeeding.  Physical Exam Vitals and nursing note  reviewed.  Constitutional:      General: She is not in acute distress.    Appearance: She is well-developed and normal weight.  HENT:     Head: Normocephalic and atraumatic.  Pulmonary:     Effort: Pulmonary effort is normal. No respiratory distress.  Abdominal:     General: Abdomen is flat.     Palpations: Abdomen is soft.     Tenderness: There is no abdominal tenderness. There is no guarding.  Skin:    General: Skin is warm and dry.  Neurological:     Mental Status: She is alert.  Psychiatric:        Mood and Affect: Mood normal.        Behavior: Behavior normal.     MAU Course  Procedures Results for orders placed or performed during the hospital encounter of 02/21/21 (from the past 24 hour(s))  CBC     Status: Abnormal   Collection Time: 02/21/21  7:10 PM  Result Value Ref Range   WBC 5.8 4.0 - 10.5 K/uL   RBC 4.17 3.87 - 5.11 MIL/uL   Hemoglobin 10.8 (L) 12.0 - 15.0 g/dL   HCT 78.9 (L) 38.1 - 01.7 %   MCV 80.3 80.0 - 100.0 fL   MCH 25.9 (L) 26.0 - 34.0 pg   MCHC 32.2 30.0 - 36.0 g/dL   RDW 51.0 (H) 25.8 - 52.7 %   Platelets 305 150 - 400 K/uL   nRBC 0.0 0.0 - 0.2 %   US OB Comp Less 14 Wks  Result Date: 02/21/2021 CLINICAL DATA:  Abdominal pain affecting 1st trimester pregnancy. EXAM: OBSTETRIC <14 WK ULTRASOUND TECHNIQUE: Transabdominal ultrasound was performed for evaluation of the gestation as well as the maternal uterus and adnexal regions. COMPARISON:  None. FINDINGS: Intrauterine gestational sac: Single Yolk sac:  Visualized. Embryo:  Visualized. Cardiac Activity: Visualized. Heart Rate: 129 bpm CRL:   7 mm   6 w 4 d                  [place] EDC: 10/13/2021 Subchorionic hemorrhage:  Moderate subchorionic hemorrhage is seen. Maternal uterus/adnexae: Small right ovarian corpus luteum is noted. Left ovary is not directly visualized, however no adnexal mass or abnormal free fluid identified. IMPRESSION: Single living IUP with estimated gestational age of [redacted] weeks 4 days, and  [place] EDC of 10/13/2021. Moderate subchorionic hemorrhage. Electronically Signed   By: Danae Orleans M.D.   On: 02/21/2021 20:03   MDM Some LLQ pain when stretches only. Ultrasound today shows live IUP, size consistent with LMP dating.   Headache resolved with tylenol  Assessment  and Plan   1. Abdominal pain during pregnancy in first trimester   2. Normal IUP (intrauterine pregnancy) on prenatal ultrasound, first trimester   3. Acute nonintractable headache, unspecified headache type   4. [redacted] weeks gestation of pregnancy    -keep scheduled ob appt -reviewed reasons to return to MAU -take tylenol prn headaches  Judeth Horn 02/21/2021, 8:16 PM

## 2021-03-18 ENCOUNTER — Encounter: Payer: Self-pay | Admitting: Obstetrics and Gynecology

## 2021-03-18 ENCOUNTER — Ambulatory Visit (INDEPENDENT_AMBULATORY_CARE_PROVIDER_SITE_OTHER): Payer: Medicaid Other | Admitting: Obstetrics and Gynecology

## 2021-03-18 DIAGNOSIS — Z5329 Procedure and treatment not carried out because of patient's decision for other reasons: Secondary | ICD-10-CM

## 2021-03-20 ENCOUNTER — Encounter: Payer: Self-pay | Admitting: Obstetrics and Gynecology

## 2021-03-20 NOTE — Progress Notes (Signed)
   Complete physical exam  Patient: Julie [name]   DOB: 07/23/1997   25 y.o. Female  MRN: [id number]  Subjective:    No chief complaint on file.   Julie [name] is a 25 y.o. female who presents today for a complete physical exam. She reports consuming a {diet types:17450} diet. {types:19826} She generally feels {DESC; WELL/FAIRLY WELL/POORLY:18703}. She reports sleeping {DESC; WELL/FAIRLY WELL/POORLY:18703}. She {does/does not:200015} have additional problems to discuss today.    Most recent fall risk assessment:    03/30/2022   10:42 AM  Fall Risk   Falls in the past year? 0  Number falls in past yr: 0  Injury with Fall? 0  Risk for fall due to : No Fall Risks  Follow up Falls evaluation completed     Most recent depression screenings:    03/30/2022   10:42 AM 02/18/2021   10:46 AM  PHQ 2/9 Scores  PHQ - 2 Score 0 0  PHQ- 9 Score 5     {VISON DENTAL STD PSA (Optional):27386}  {History (Optional):23778}  Patient Care Team: Jessup, Joy, NP as PCP - General (Nurse Practitioner)   Outpatient Medications Prior to Visit  Medication Sig   fluticasone (FLONASE) 50 MCG/ACT nasal spray Place 2 sprays into both nostrils in the morning and at bedtime. After 7 days, reduce to once daily.   norgestimate-ethinyl estradiol (SPRINTEC 28) 0.25-35 MG-MCG tablet Take 1 tablet by mouth daily.   Nystatin POWD Apply liberally to affected area 2 times per day   spironolactone (ALDACTONE) 100 MG tablet Take 1 tablet (100 mg total) by mouth daily.   No facility-administered medications prior to visit.    ROS        Objective:     There were no vitals taken for this visit. {Vitals History (Optional):23777}  Physical Exam   No results found for any visits on 05/05/22. {Show previous labs (optional):23779}    Assessment & Plan:    Routine Health Maintenance and Physical Exam  Immunization History  Administered Date(s) Administered   DTaP 10/06/1999, 12/02/1999,  02/10/2000, 10/26/2000, 05/11/2004   Hepatitis A 03/07/2008, 03/13/2009   Hepatitis B 07/24/1999, 08/31/1999, 02/10/2000   HiB (PRP-OMP) 10/06/1999, 12/02/1999, 02/10/2000, 10/26/2000   IPV 10/06/1999, 12/02/1999, 07/31/2000, 05/11/2004   Influenza,inj,Quad PF,6+ Mos 06/13/2014   Influenza-Unspecified 09/12/2012   MMR 07/31/2001, 05/11/2004   Meningococcal Polysaccharide 03/12/2012   Pneumococcal Conjugate-13 10/26/2000   Pneumococcal-Unspecified 02/10/2000, 04/25/2000   Tdap 03/12/2012   Varicella 07/31/2000, 03/07/2008    Health Maintenance  Topic Date Due   HIV Screening  Never done   Hepatitis C Screening  Never done   INFLUENZA VACCINE  05/03/2022   PAP-Cervical Cytology Screening  05/05/2022 (Originally 07/22/2020)   PAP SMEAR-Modifier  05/05/2022 (Originally 07/22/2020)   TETANUS/TDAP  05/05/2022 (Originally 03/12/2022)   HPV VACCINES  Discontinued   COVID-19 Vaccine  Discontinued    Discussed health benefits of physical activity, and encouraged her to engage in regular exercise appropriate for her age and condition.  Problem List Items Addressed This Visit   None Visit Diagnoses     Annual physical exam    -  Primary   Cervical cancer screening       Need for Tdap vaccination          No follow-ups on file.     Joy Jessup, NP   

## 2021-03-24 ENCOUNTER — Other Ambulatory Visit: Payer: Self-pay

## 2021-03-24 ENCOUNTER — Inpatient Hospital Stay (HOSPITAL_COMMUNITY)
Admission: AD | Admit: 2021-03-24 | Discharge: 2021-03-24 | Disposition: A | Discharge status: Home / Self Care | Payer: Medicaid Other | Attending: Obstetrics & Gynecology | Admitting: Obstetrics & Gynecology

## 2021-03-24 DIAGNOSIS — O4691 Antepartum hemorrhage, unspecified, first trimester: Secondary | ICD-10-CM | POA: Diagnosis not present

## 2021-03-24 DIAGNOSIS — O209 Hemorrhage in early pregnancy, unspecified: Secondary | ICD-10-CM | POA: Diagnosis present

## 2021-03-24 DIAGNOSIS — Z3A11 11 weeks gestation of pregnancy: Secondary | ICD-10-CM

## 2021-03-24 LAB — WET PREP, GENITAL
Clue Cells Wet Prep HPF POC: NONE SEEN
Sperm: NONE SEEN
Trich, Wet Prep: NONE SEEN
Yeast Wet Prep HPF POC: NONE SEEN

## 2021-03-24 NOTE — MAU Note (Signed)
..  Julie [name] is a 25 y.o. at [redacted]w[redacted]d here in MAU reporting: Yesterday she had some vaginal bleeding, none today. This morning she had some abdominal cramping, none now. She is here because she wants to see where the bleeding came from.   Pain score: 0/10 Vitals:   03/24/21 1939 03/24/21 1940  BP:  104/61  Pulse:  (!) 110  Resp:  17  Temp:  98.8 F (37.1 C)  SpO2: 100%      FHT: 161 by doppler

## 2021-03-24 NOTE — MAU Provider Note (Signed)
Event Date/Time   First Provider Initiated Contact with Patient 03/24/21 1952      S Ms. Julie [name] is a 25 y.o. [phone] patient who presents to MAU today with complaint of vaginal bleeding. Patient reports she started having light vaginal spotting when wiping last night. She has not had any spotting or bleeding today. She denies pain, itching/irritation, odor or urinary s/s. She wanted to make sure baby was okay.   O BP 104/61 (BP Location: Right Arm)   Pulse (!) 110   Temp 98.8 F (37.1 C) (Oral)   Resp 17   Ht 5\' 5"  (1.651 m)   Wt 63.1 kg   LMP 01/05/2021 (Exact Date)   SpO2 100%   BMI 23.16 kg/m  Physical Exam Cardiovascular:     Rate and Rhythm: Normal rate.  Pulmonary:     Effort: Pulmonary effort is normal.  Abdominal:     General: Abdomen is flat.     Palpations: Abdomen is soft.     Tenderness: There is no abdominal tenderness.  Musculoskeletal:        General: Normal range of motion.  Neurological:     General: No focal deficit present.     Mental Status: She is alert.  Psychiatric:        Mood and Affect: Mood normal.        Behavior: Behavior normal.        Thought Content: Thought content normal.        Judgment: Judgment normal.    Wet prep unremarkable GC/CT pending  A Medical screening exam complete [redacted] weeks gestation of pregnancy Vaginal bleeding affecting early pregnancy   P Discharge from MAU in stable condition Warning signs for worsening condition that would warrant emergency follow-up discussed Patient may return to MAU as needed  Patient to keep appointment as scheduled tomorrow at [facility] MSN, CNM 03/24/2021 8:36 PM

## 2021-03-25 ENCOUNTER — Encounter: Payer: Self-pay | Admitting: Obstetrics and Gynecology

## 2021-03-25 ENCOUNTER — Ambulatory Visit (INDEPENDENT_AMBULATORY_CARE_PROVIDER_SITE_OTHER): Payer: Medicaid Other | Admitting: Obstetrics and Gynecology

## 2021-03-25 VITALS — BP 100/63 | HR 107 | Temp 99.0°F | Wt 138.6 lb

## 2021-03-25 DIAGNOSIS — Z3A11 11 weeks gestation of pregnancy: Secondary | ICD-10-CM | POA: Diagnosis not present

## 2021-03-25 DIAGNOSIS — Z3480 Encounter for supervision of other normal pregnancy, unspecified trimester: Secondary | ICD-10-CM

## 2021-03-25 DIAGNOSIS — O09899 Supervision of other high risk pregnancies, unspecified trimester: Secondary | ICD-10-CM | POA: Insufficient documentation

## 2021-03-25 LAB — GC/CHLAMYDIA PROBE AMP (~~LOC~~) NOT AT ARMC
Chlamydia: NEGATIVE
Comment: NEGATIVE
Comment: NORMAL
Neisseria Gonorrhea: NEGATIVE

## 2021-03-25 MED ORDER — BLOOD PRESSURE MONITOR AUTOMAT DEVI: 1.0000 | Freq: Every day | 0 refills | Status: DC

## 2021-03-25 MED ORDER — GOJJI WEIGHT SCALE MISC: 1.0000 | Freq: Every day | 0 refills | Status: DC | PRN

## 2021-03-25 NOTE — Progress Notes (Signed)
INITIAL OBSTETRICAL VISIT Patient name: Julie [name] MRN [id number]  Date of birth: 11-Nov-1995 Chief Complaint:   Initial Prenatal Visit  History of Present Illness:   Julie [name] is a 25 y.o. G34P3003 African American female at [redacted]w[redacted]d by 6.4 wks U/S with an Estimated Date of Delivery: 10/12/21 being seen today for her initial obstetrical visit.  Her obstetrical history is significant for  closely spaced pregnancies . Pregnancy #1: 2019 SVD complicated with gHTN female weighing 6 lbs 1 oz; #2 2020 SVD no complications female weighing 7 lbs; #3 2021 SVD with no complications female weighing 6 lbs. This is an unplanned pregnancy. She and the father of the baby (FOB) live together. She has a support system that consists of FOB/her mother/father/friends. Today she reports no complaints.   Patient's last menstrual period was 01/05/2021 (exact date). Last pap 10/02/2019. Results were: normal Review of Systems:   Pertinent items are noted in HPI Denies cramping/contractions, leakage of fluid, vaginal bleeding, abnormal vaginal discharge w/ itching/odor/irritation, headaches, visual changes, shortness of breath, chest pain, abdominal pain, severe nausea/vomiting, or problems with urination or bowel movements unless otherwise stated above.  Pertinent History Reviewed:  Reviewed past medical,surgical, social, obstetrical and family history.  Reviewed problem list, medications and allergies. OB History  Gravida Para Term Preterm AB Living  4 3 3     3   SAB IAB Ectopic Multiple Live Births        0 3    # Outcome Date GA Lbr Len/2nd Weight Sex Delivery Anes PTL Lv  4 Current           3 Term 02/27/20 [redacted]w[redacted]d / 00:12 6 lb 10.2 oz (3.011 kg) F Vag-Spont EPI  LIV  2 Term 04/21/19 [redacted]w[redacted]d 11:26 / 00:25 7 lb 0.9 oz (3.201 kg) F Vag-Spont EPI  LIV     Birth Comments: WNL  1 Term 10/12/17 [redacted]w[redacted]d / 01:06 6 lb 1.4 oz (2.761 kg) F Vag-Spont EPI  LIV     Birth Comments: WNL   Physical Assessment:    Vitals:   03/25/21 0847  BP: 100/63  Pulse: (!) 107  Temp: 99 F (37.2 C)  Weight: 138 lb 9.6 oz (62.9 kg)  Body mass index is 23.06 kg/m.       Physical Examination:  General appearance - well appearing, and in no distress  Mental status - alert, oriented to person, place, and time  Psych:  She has a normal mood and affect  Skin - warm and dry, normal color, no suspicious lesions noted  Chest - effort normal, all lung fields clear to auscultation bilaterally  Heart - normal rate and regular rhythm  Abdomen - soft, nontender  Extremities:  No swelling or varicosities noted  Pelvic - deferred (pelvic done yesterday in MAU)  Thin prep pap is not done    FHTs by doppler: 151 bpm  Assessment & Plan:  1) Low-Risk Pregnancy 03/27/21 at [redacted]w[redacted]d with an Estimated Date of Delivery: 10/12/21   2) Initial OB visit - Welcomed to practice and introduced self to patient in addition to discussing other advanced practice providers that she may be seeing at this practice - Congratulated patient - Anticipatory guidance on upcoming appointments - Educated on COVID19 and pregnancy and the integration of virtual appointments  - Educated on babyscripts app- patient reports she has not received email, encouraged to look in spam folder and to call office if she still has not received email - patient verbalizes understanding  3) Supervision of other normal pregnancy, antepartum  4) [redacted] weeks gestation of pregnancy    Meds:  Meds ordered this encounter  Medications   Blood Pressure Monitoring (BLOOD PRESSURE MONITOR AUTOMAT) DEVI    Sig: 1 Device by Does not apply route daily. Automatic blood pressure cuff regular size. To monitor blood pressure regularly at home. ICD-10 code:Z34.90    Dispense:  1 each    Refill:  0   Misc. Devices (GOJJI WEIGHT SCALE) MISC    Sig: 1 Device by Does not apply route daily as needed. To weight self daily as needed at home. ICD-10 code: O37.90    Dispense:  1  each    Refill:  0    Initial labs obtained Continue prenatal vitamins Reviewed n/v relief measures and warning s/s to report Reviewed recommended weight gain based on pre-gravid BMI Encouraged well-balanced diet Genetic Screening discussed: ordered Cystic fibrosis, SMA, Fragile X screening discussed results reviewed The nature of [facility] with multiple MDs and other Advanced Practice Providers was explained to patient; also emphasized that residents, students are part of our team.  Discussed optimized OB schedule and video visits. Advised can have an in-office visit whenever she feels she needs to be seen.  Does have own BP cuff. BP cuff Rx faxed today. Explained to patient that BP will be mailed to her house. Check BP weekly, let us know if >140/90. Advised to call during normal business hours and there is an after-hours nurse line available.    Follow-up: Return in about 4 weeks (around 04/22/2021) for Return OB visit.   Orders Placed This Encounter  Procedures   Culture, OB Urine   Obstetric Panel, Including HIV   Hepatitis C antibody   Hemoglobin A1c   Genetic Screening    Raelyn Mora MSN, [place] 03/25/2021

## 2021-03-26 LAB — HEPATITIS C ANTIBODY: Hep C Virus Ab: <0.1 s/co ratio (ref 0.0–0.9)

## 2021-03-26 LAB — OBSTETRIC PANEL, INCLUDING HIV
Basophils Absolute: 0 10*3/uL (ref 0.0–0.2)
Basos: 0 %
EOS (ABSOLUTE): 0 10*3/uL (ref 0.0–0.4)
Eos: 0 %
HIV Screen 4th Generation wRfx: NONREACTIVE
Hematocrit: 35.1 % (ref 34.0–46.6)
Hemoglobin: 11.4 g/dL (ref 11.1–15.9)
Hepatitis B Surface Ag: NEGATIVE
Immature Grans (Abs): 0 10*3/uL (ref 0.0–0.1)
Immature Granulocytes: 0 %
Lymphocytes Absolute: 2 10*3/uL (ref 0.7–3.1)
Lymphs: 36 %
MCH: 26.3 pg — ABNORMAL LOW (ref 26.6–33.0)
MCHC: 32.5 g/dL (ref 31.5–35.7)
MCV: 81 fL (ref 79–97)
Monocytes Absolute: 0.5 10*3/uL (ref 0.1–0.9)
Monocytes: 10 %
Neutrophils Absolute: 3 10*3/uL (ref 1.4–7.0)
Neutrophils: 54 %
Platelets: 310 10*3/uL (ref 150–450)
RBC: 4.33 x10E6/uL (ref 3.77–5.28)
RDW: 17.1 % — ABNORMAL HIGH (ref 11.7–15.4)
RPR Ser Ql: NONREACTIVE
Rh Factor: POSITIVE
Rubella Antibodies, IGG: 3.4 index (ref >0.99)
WBC: 5.6 10*3/uL (ref 3.4–10.8)

## 2021-03-26 LAB — AB SCR+ANTIBODY ID: Antibody Screen: POSITIVE — AB

## 2021-03-26 LAB — HEMOGLOBIN A1C
Est. average glucose Bld gHb Est-mCnc: 114 mg/dL
Hgb A1c MFr Bld: 5.6 % (ref 4.8–5.6)

## 2021-03-27 LAB — URINE CULTURE, OB REFLEX

## 2021-03-27 LAB — CULTURE, OB URINE

## 2021-04-10 ENCOUNTER — Other Ambulatory Visit: Payer: Self-pay

## 2021-04-10 ENCOUNTER — Inpatient Hospital Stay (HOSPITAL_COMMUNITY)
Admission: AD | Admit: 2021-04-10 | Discharge: 2021-04-10 | Disposition: A | Discharge status: Home / Self Care | Payer: Medicaid Other | Attending: Obstetrics & Gynecology | Admitting: Obstetrics & Gynecology

## 2021-04-10 ENCOUNTER — Encounter (HOSPITAL_COMMUNITY): Payer: Self-pay | Admitting: Obstetrics & Gynecology

## 2021-04-10 DIAGNOSIS — K219 Gastro-esophageal reflux disease without esophagitis: Secondary | ICD-10-CM | POA: Insufficient documentation

## 2021-04-10 DIAGNOSIS — O99611 Diseases of the digestive system complicating pregnancy, first trimester: Secondary | ICD-10-CM | POA: Diagnosis not present

## 2021-04-10 DIAGNOSIS — R1010 Upper abdominal pain, unspecified: Secondary | ICD-10-CM | POA: Diagnosis not present

## 2021-04-10 DIAGNOSIS — O26891 Other specified pregnancy related conditions, first trimester: Secondary | ICD-10-CM

## 2021-04-10 DIAGNOSIS — R519 Headache, unspecified: Secondary | ICD-10-CM

## 2021-04-10 DIAGNOSIS — Z3A13 13 weeks gestation of pregnancy: Secondary | ICD-10-CM

## 2021-04-10 DIAGNOSIS — R12 Heartburn: Secondary | ICD-10-CM | POA: Diagnosis not present

## 2021-04-10 DIAGNOSIS — O26892 Other specified pregnancy related conditions, second trimester: Secondary | ICD-10-CM

## 2021-04-10 LAB — URINALYSIS, ROUTINE W REFLEX MICROSCOPIC
Bacteria, UA: NONE SEEN
Bilirubin Urine: NEGATIVE
Glucose, UA: NEGATIVE mg/dL
Hgb urine dipstick: NEGATIVE
Ketones, ur: 5 mg/dL — AB
Nitrite: NEGATIVE
Protein, ur: NEGATIVE mg/dL
Specific Gravity, Urine: 1.024 (ref 1.005–1.030)
pH: 5 (ref 5.0–8.0)

## 2021-04-10 MED ORDER — ACETAMINOPHEN 500 MG PO TABS
1000.0000 mg | ORAL_TABLET | Freq: Once | ORAL | Status: AC
  Administered 2021-04-10: 1000 mg via ORAL
  Filled 2021-04-10: qty 2

## 2021-04-10 MED ORDER — LIDOCAINE VISCOUS HCL 2 % MT SOLN
15.0000 mL | Freq: Once | OROMUCOSAL | Status: AC
  Administered 2021-04-10: 15 mL via ORAL
  Filled 2021-04-10: qty 15

## 2021-04-10 MED ORDER — ALUM & MAG HYDROXIDE-SIMETH 200-200-20 MG/5ML PO SUSP
30.0000 mL | Freq: Once | ORAL | Status: AC
  Administered 2021-04-10: 30 mL via ORAL
  Filled 2021-04-10: qty 30

## 2021-04-10 MED ORDER — FAMOTIDINE 20 MG PO TABS: 20.0000 mg | ORAL_TABLET | Freq: Two times a day (BID) | ORAL | 0 refills | Status: DC

## 2021-04-10 NOTE — MAU Provider Note (Signed)
History     CSN: [id number]  Arrival date and time: 04/10/21 1557   Event Date/Time   First Provider Initiated Contact with Patient 04/10/21 1631      Chief Complaint  Patient presents with   Abdominal Pain   Headache   Vaginal Bleeding   HPI Julie [name] is a 25 y.o. G4P3003 at [redacted]w[redacted]d who presents with a headache and upper abdominal pain. She reports the headaches have been ongoing throughout the pregnancy. She rates the headache a 5/10 and has not tried anything for the pain. She reports normally tylenol and water helps. She also reports upper abdominal pain. She reports it is sharp and comes and goes. She reports it worsened after eating salisbury steak today. She denies any vaginal bleeding or leaking of fluid.   OB History     Gravida  4   Para  3   Term  3   Preterm      AB      Living  3      SAB      IAB      Ectopic      Multiple  0   Live Births  3           Past Medical History:  Diagnosis Date   GERD (gastroesophageal reflux disease)    Supervision of other normal pregnancy, antepartum 09/16/2019    Nursing Staff Provider Office Location  [place] Dating  Ultrasound Language  English Anatomy US  Normal, isolated EIF  Flu Vaccine  Declined Genetic Screen  NIPS:   Low risk girl AFP:   Screen negative  TDaP vaccine   Declined Hgb A1C or  GTT Early  Third trimester    Ref Range & Units 1 d ago  Glucose, Fasting 65 - 91 mg/dL 76   Glucose, 1 hour 65 - 179 mg/dL 259   Glucose, 2 hour 65 - 152 m    Past Surgical History:  Procedure Laterality Date   CHOLECYSTECTOMY N/A 01/09/2018   Procedure: LAPAROSCOPIC CHOLECYSTECTOMY WITH INTRAOPERATIVE CHOLANGIOGRAM;  Surgeon: Claud Kelp, MD;  Location: [facility];  Service: General;  Laterality: N/A;    Family History  Problem Relation Age of Onset   Diabetes Mother    Pancreatic cancer Father    Cancer Maternal Grandfather     Social History   Tobacco Use   Smoking status:  Never   Smokeless tobacco: Never  Vaping Use   Vaping Use: Never used  Substance Use Topics   Alcohol use: No   Drug use: No    Allergies: No Known Allergies  Medications Prior to Admission  Medication Sig Dispense Refill Last Dose   acetaminophen (TYLENOL) 500 MG tablet Take 1,000 mg by mouth every 6 (six) hours as needed for moderate pain. (Patient not taking: Reported on 03/25/2021)      Blood Pressure Monitoring (BLOOD PRESSURE MONITOR AUTOMAT) DEVI 1 Device by Does not apply route daily. Automatic blood pressure cuff regular size. To monitor blood pressure regularly at home. ICD-10 code:Z34.90 1 each 0    Elastic Bandages & Supports (COMFORT FIT MATERNITY SUPP SM) MISC 1 Units by Does not apply route daily as needed. (Patient not taking: Reported on 03/25/2021) 1 each 0    ferrous sulfate 325 (65 FE) MG tablet Take 1 tablet (325 mg total) by mouth daily with breakfast. (Patient not taking: Reported on 03/25/2021) 30 tablet 0    Misc. Devices (GOJJI WEIGHT SCALE) MISC 1 Device by  Does not apply route daily as needed. To weight self daily as needed at home. ICD-10 code: O09.90 1 each 0    Prenatal MV-Min-FA-Omega-3 (PRENATAL GUMMIES/DHA & FA) 0.4-32.5 MG CHEW Chew 1 Dose by mouth daily. (Patient not taking: Reported on 03/25/2021) 30 tablet 1     Review of Systems  Constitutional: Negative.  Negative for fatigue and fever.  HENT: Negative.    Respiratory: Negative.  Negative for shortness of breath.   Cardiovascular: Negative.  Negative for chest pain.  Gastrointestinal:  Positive for abdominal pain. Negative for constipation, diarrhea, nausea and vomiting.  Genitourinary: Negative.  Negative for dysuria, vaginal bleeding and vaginal discharge.  Neurological:  Positive for headaches. Negative for dizziness.  Physical Exam   Blood pressure (!) 105/53, pulse (!) 103, temperature 98.8 F (37.1 C), temperature source Oral, resp. rate 17, last menstrual period 01/05/2021, SpO2 98 %, not  currently breastfeeding.  Physical Exam Vitals and nursing note reviewed.  Constitutional:      General: She is not in acute distress.    Appearance: She is well-developed.  HENT:     Head: Normocephalic.  Eyes:     Pupils: Pupils are equal, round, and reactive to light.  Cardiovascular:     Rate and Rhythm: Normal rate and regular rhythm.     Heart sounds: Normal heart sounds.  Pulmonary:     Effort: Pulmonary effort is normal. No respiratory distress.     Breath sounds: Normal breath sounds.  Abdominal:     General: Bowel sounds are normal. There is no distension.     Palpations: Abdomen is soft.     Tenderness: There is no abdominal tenderness.  Skin:    General: Skin is warm and dry.  Neurological:     Mental Status: She is alert and oriented to person, place, and time.  Psychiatric:        Mood and Affect: Mood normal.        Behavior: Behavior normal.        Thought Content: Thought content normal.        Judgment: Judgment normal.    FHT: 153 bpm  MAU Course  Procedures Results for orders placed or performed during the hospital encounter of 04/10/21 (from the past 24 hour(s))  Urinalysis, Routine w reflex microscopic Urine, Clean Catch     Status: Abnormal   Collection Time: 04/10/21  4:17 PM  Result Value Ref Range   Color, Urine YELLOW YELLOW   APPearance HAZY (A) CLEAR   Specific Gravity, Urine 1.024 1.005 - 1.030   pH 5.0 5.0 - 8.0   Glucose, UA NEGATIVE NEGATIVE mg/dL   Hgb urine dipstick NEGATIVE NEGATIVE   Bilirubin Urine NEGATIVE NEGATIVE   Ketones, ur 5 (A) NEGATIVE mg/dL   Protein, ur NEGATIVE NEGATIVE mg/dL   Nitrite NEGATIVE NEGATIVE   Leukocytes,Ua LARGE (A) NEGATIVE   RBC / HPF 21-50 0 - 5 RBC/hpf   WBC, UA 21-50 0 - 5 WBC/hpf   Bacteria, UA NONE SEEN NONE SEEN   Squamous Epithelial / LPF 0-5 0 - 5   Mucus PRESENT     MDM UA, UC GI Cocktail Tylenol  Patient reports complete resolution of pain.  Assessment and Plan   1. Heartburn  during pregnancy in second trimester   2. [redacted] weeks gestation of pregnancy   3. Pregnancy headache in second trimester    -Discharge home in stable condition -Rx for pepcid sent to patient's pharmacy -Second trimester precautions discussed -Patient  advised to follow-up with OB as scheduled for prenatal care -Patient may return to MAU as needed or if her condition were to change or worsen   Rolm Bookbinder CNM 04/10/2021, 4:31 PM

## 2021-04-10 NOTE — MAU Note (Signed)
Pt reports to mau with c/o lower abd cramping and spotting.  Pt reports spotting has been ongoing since finding out she was pregnant.  Pt also reports "mild" headache.

## 2021-04-10 NOTE — Discharge Instructions (Signed)

## 2021-04-11 LAB — CULTURE, OB URINE: Culture: NO GROWTH

## 2021-04-22 ENCOUNTER — Ambulatory Visit (INDEPENDENT_AMBULATORY_CARE_PROVIDER_SITE_OTHER): Payer: Medicaid Other | Admitting: Obstetrics and Gynecology

## 2021-04-22 ENCOUNTER — Other Ambulatory Visit: Payer: Self-pay

## 2021-04-22 ENCOUNTER — Encounter: Payer: Self-pay | Admitting: Obstetrics and Gynecology

## 2021-04-22 VITALS — BP 123/71 | HR 102 | Temp 98.1°F | Wt 136.2 lb

## 2021-04-22 DIAGNOSIS — Z3A15 15 weeks gestation of pregnancy: Secondary | ICD-10-CM

## 2021-04-22 DIAGNOSIS — O26892 Other specified pregnancy related conditions, second trimester: Secondary | ICD-10-CM

## 2021-04-22 DIAGNOSIS — Z3480 Encounter for supervision of other normal pregnancy, unspecified trimester: Secondary | ICD-10-CM

## 2021-04-22 DIAGNOSIS — R12 Heartburn: Secondary | ICD-10-CM

## 2021-04-22 NOTE — Progress Notes (Signed)
   LOW-RISK PREGNANCY OFFICE VISIT Patient name: Julie [name] MRN [id number]  Date of birth: 02-17-1996 Chief Complaint:   Routine Prenatal Visit  History of Present Illness:   Julie [name] is a 25 y.o. [phone] female at [redacted]w[redacted]d with an Estimated Date of Delivery: 10/12/21 being seen today for ongoing management of a low-risk pregnancy.  Today she reports no complaints. Contractions: Not present. Vag. Bleeding: None.  Movement: Present. denies leaking of fluid. Review of Systems:   Pertinent items are noted in HPI Denies abnormal vaginal discharge w/ itching/odor/irritation, headaches, visual changes, shortness of breath, chest pain, abdominal pain, severe nausea/vomiting, or problems with urination or bowel movements unless otherwise stated above. Pertinent History Reviewed:  Reviewed past medical,surgical, social, obstetrical and family history.  Reviewed problem list, medications and allergies. Physical Assessment:   Vitals:   04/22/21 1002  BP: 123/71  Pulse: (!) 102  Temp: 98.1 F (36.7 C)  Weight: 136 lb 3.2 oz (61.8 kg)  Body mass index is 22.66 kg/m.        Physical Examination:   General appearance: Well appearing, and in no distress  Mental status: Alert, oriented to person, place, and time  Skin: Warm & dry  Cardiovascular: Normal heart rate noted  Respiratory: Normal respiratory effort, no distress  Abdomen: Soft, gravid, nontender  Pelvic: Cervical exam deferred         Extremities: Edema: None  Fetal Status: Fetal Heart Rate (bpm): 142   Movement: Present    No results found for this or any previous visit (from the past 24 hour(s)).  Assessment & Plan:  1) Low-risk pregnancy G4P3003 at [redacted]w[redacted]d with an Estimated Date of Delivery: 10/12/21   2) Supervision of other normal pregnancy, antepartum  - [place] MFM OB DETAIL +14 WK,  - AFP, Serum, Open Spina Bifida  3) Heartburn during pregnancy in second trimester - Resolved  4) [redacted] weeks gestation of pregnancy     Meds: No orders of the defined types were placed in this encounter.  Labs/procedures today: AFP  Plan:  Continue routine obstetrical care   Reviewed: Preterm labor symptoms and general obstetric precautions including but not limited to vaginal bleeding, contractions, leaking of fluid and fetal movement were reviewed in detail with the patient.  All questions were answered. Has home bp cuff. Check bp weekly, let us know if >140/90.   Follow-up: Return in about 5 weeks (around 05/27/2021) for Return OB - My Chart video.  Orders Placed This Encounter  Procedures   [place] MFM OB DETAIL +14 WK   AFP, Serum, Open Spina Bifida   Raelyn Mora MSN, CNM 04/22/2021 10:50 AM

## 2021-04-24 LAB — AFP, SERUM, OPEN SPINA BIFIDA
AFP MoM: 0.69
AFP Value: 24.8 ng/mL
Gest. Age on Collection Date: 15 weeks
Maternal Age At EDD: 23.4 yr
OSBR Risk 1 IN: 10000
Test Results:: NEGATIVE
Weight: 136 [lb_av]

## 2021-05-19 ENCOUNTER — Ambulatory Visit: Payer: Medicaid Other | Admitting: *Deleted

## 2021-05-19 ENCOUNTER — Encounter: Payer: Self-pay | Admitting: *Deleted

## 2021-05-19 ENCOUNTER — Other Ambulatory Visit: Payer: Self-pay | Admitting: *Deleted

## 2021-05-19 ENCOUNTER — Other Ambulatory Visit: Payer: Self-pay

## 2021-05-19 ENCOUNTER — Ambulatory Visit: Payer: Medicaid Other | Attending: Obstetrics and Gynecology

## 2021-05-19 VITALS — BP 100/51 | HR 107

## 2021-05-19 DIAGNOSIS — Z3480 Encounter for supervision of other normal pregnancy, unspecified trimester: Secondary | ICD-10-CM | POA: Insufficient documentation

## 2021-05-19 DIAGNOSIS — O09892 Supervision of other high risk pregnancies, second trimester: Secondary | ICD-10-CM

## 2021-05-19 DIAGNOSIS — Z8759 Personal history of other complications of pregnancy, childbirth and the puerperium: Secondary | ICD-10-CM

## 2021-05-19 DIAGNOSIS — Z3689 Encounter for other specified antenatal screening: Secondary | ICD-10-CM

## 2021-05-27 ENCOUNTER — Telehealth (INDEPENDENT_AMBULATORY_CARE_PROVIDER_SITE_OTHER): Payer: Medicaid Other | Admitting: Obstetrics and Gynecology

## 2021-05-27 ENCOUNTER — Telehealth: Payer: Medicaid Other | Admitting: Obstetrics and Gynecology

## 2021-05-27 ENCOUNTER — Other Ambulatory Visit: Payer: Self-pay

## 2021-05-27 DIAGNOSIS — Z3480 Encounter for supervision of other normal pregnancy, unspecified trimester: Secondary | ICD-10-CM

## 2021-05-27 DIAGNOSIS — Z3A20 20 weeks gestation of pregnancy: Secondary | ICD-10-CM

## 2021-05-27 DIAGNOSIS — Z3482 Encounter for supervision of other normal pregnancy, second trimester: Secondary | ICD-10-CM

## 2021-05-27 NOTE — Progress Notes (Signed)
   MY CHART VIDEO VIRTUAL OBSTETRICS VISIT ENCOUNTER NOTE  I connected with [name] on 05/27/21 at  4:30 PM EDT by My Chart video at home and verified that I am speaking with the correct person using two identifiers. Provider located at [facility] [employer] for [employer] at [place].   I discussed the limitations, risks, security and privacy concerns of performing an evaluation and management service by My Chart video and the availability of in person appointments. I also discussed with the patient that there may be a patient responsible charge related to this service. The patient expressed understanding and agreed to proceed.  I discussed the limitations of telemedicine and the availability of in person appointments.  Discussed there is a possibility of technology failure and discussed alternative modes of communication if that failure occurs.  Subjective:  Julie [name] is a 25 y.o. G4P3003 at [redacted]w[redacted]d being followed for ongoing prenatal care.  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  Patient reports no complaints. Discussed U/S results. Reports fetal movement. Denies any contractions, bleeding or leaking of fluid.   The following portions of the patient's history were reviewed and updated as appropriate: allergies, current medications, past family history, past medical history, past social history, past surgical history and problem list.   Objective:   General:  Alert, oriented and cooperative.   Mental Status: Normal mood and affect perceived. Normal judgment and thought content.  Rest of physical exam deferred due to type of encounter  LMP 01/05/2021 (Exact Date)  **Patient will send VS will be sent via MyChart   Assessment and Plan:   Pregnancy: G4P3003 at [redacted]w[redacted]d  1. Supervision of other normal pregnancy, antepartum - Advised that MFM will make recommendations for F/U U/S  2. [redacted] weeks gestation of pregnancy   Preterm labor symptoms and general obstetric precautions including but not limited to vaginal bleeding, contractions, leaking of fluid and fetal movement were reviewed in detail with the patient.  I discussed the assessment and treatment plan with the patient. The patient was provided an opportunity to ask questions and all were answered. The patient agreed with the plan and demonstrated an understanding of the instructions. The patient was advised to call back or seek an in-person office evaluation/go to MAU at [facility] for any urgent or concerning symptoms. Please refer to After Visit Summary for other counseling recommendations.   I provided 5 minutes of non-face-to-face time during this encounter. There was 5 minutes of chart review time spent prior to this encounter. Total time spent = 10 minutes.  Return in about 4 weeks (around 06/24/2021) for Return OB - My Chart video.  Future Appointments  Date Time Provider Department Center  06/16/2021  1:45 PM [facility] NURSE [facility]  06/16/2021  2:00 PM [facility] US1 [facility]  06/24/2021 10:15 AM Raelyn Mora, CNM [facility] None    Raelyn Mora, CNM Center for [employer], [facility]

## 2021-06-01 ENCOUNTER — Encounter: Payer: Self-pay | Admitting: Obstetrics and Gynecology

## 2021-06-06 ENCOUNTER — Other Ambulatory Visit: Payer: Self-pay

## 2021-06-06 ENCOUNTER — Inpatient Hospital Stay (HOSPITAL_COMMUNITY)
Admission: AD | Admit: 2021-06-06 | Discharge: 2021-06-06 | Disposition: A | Discharge status: Home / Self Care | Payer: Medicaid Other | Attending: Obstetrics and Gynecology | Admitting: Obstetrics and Gynecology

## 2021-06-06 DIAGNOSIS — R300 Dysuria: Secondary | ICD-10-CM

## 2021-06-06 DIAGNOSIS — O26892 Other specified pregnancy related conditions, second trimester: Secondary | ICD-10-CM

## 2021-06-06 DIAGNOSIS — A5901 Trichomonal vulvovaginitis: Secondary | ICD-10-CM

## 2021-06-06 DIAGNOSIS — Z8619 Personal history of other infectious and parasitic diseases: Secondary | ICD-10-CM

## 2021-06-06 DIAGNOSIS — O99612 Diseases of the digestive system complicating pregnancy, second trimester: Secondary | ICD-10-CM | POA: Diagnosis not present

## 2021-06-06 DIAGNOSIS — R102 Pelvic and perineal pain: Secondary | ICD-10-CM | POA: Insufficient documentation

## 2021-06-06 DIAGNOSIS — K219 Gastro-esophageal reflux disease without esophagitis: Secondary | ICD-10-CM | POA: Insufficient documentation

## 2021-06-06 DIAGNOSIS — O98312 Other infections with a predominantly sexual mode of transmission complicating pregnancy, second trimester: Secondary | ICD-10-CM | POA: Diagnosis not present

## 2021-06-06 DIAGNOSIS — Z3480 Encounter for supervision of other normal pregnancy, unspecified trimester: Secondary | ICD-10-CM

## 2021-06-06 DIAGNOSIS — Z3A21 21 weeks gestation of pregnancy: Secondary | ICD-10-CM | POA: Diagnosis not present

## 2021-06-06 DIAGNOSIS — A599 Trichomoniasis, unspecified: Secondary | ICD-10-CM

## 2021-06-06 HISTORY — DX: Personal history of other infectious and parasitic diseases: Z86.19

## 2021-06-06 LAB — WET PREP, GENITAL
Clue Cells Wet Prep HPF POC: NONE SEEN
Sperm: NONE SEEN
Yeast Wet Prep HPF POC: NONE SEEN

## 2021-06-06 LAB — URINALYSIS, ROUTINE W REFLEX MICROSCOPIC
Glucose, UA: NEGATIVE mg/dL
Hgb urine dipstick: NEGATIVE
Ketones, ur: 5 mg/dL — AB
Nitrite: NEGATIVE
Protein, ur: 30 mg/dL — AB
Specific Gravity, Urine: 1.03 (ref 1.005–1.030)
WBC, UA: >50 WBC/hpf — ABNORMAL HIGH (ref 0–5)
pH: 6 (ref 5.0–8.0)

## 2021-06-06 MED ORDER — METRONIDAZOLE 500 MG PO TABS: 2000.0000 mg | ORAL_TABLET | Freq: Once | ORAL | 0 refills | Status: AC

## 2021-06-06 NOTE — MAU Provider Note (Signed)
History     CSN: [id number]  Arrival date and time: 06/06/21 1556   None     No chief complaint on file.  Julie [name] is a 25 y.o. G4P3003 at [redacted]w[redacted]d who receives care at [facility].  She presents today for Pelvic Pain.  She states she has been having pressure with urination for the past 5 days.  She states the pain is intermittent and occurs after urination.  She states it is arises after long periods of standing.  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 She reports drinking anywhere from 1 to 5 bottles of water daily.  She does not wear a maternity belt or belly support band.  She endorses fetal movement and denies vaginal concerns.  She also denies pain or difficulty with sexual activity.    OB History     Gravida  4   Para  3   Term  3   Preterm      AB      Living  3      SAB      IAB      Ectopic      Multiple  0   Live Births  3           Past Medical History:  Diagnosis Date   GERD (gastroesophageal reflux disease)    Supervision of other normal pregnancy, antepartum 09/16/2019    Nursing Staff Provider Office Location  [facility] Dating  Ultrasound Language  English Anatomy US  Normal, isolated EIF  Flu Vaccine  Declined Genetic Screen  NIPS:   Low risk girl AFP:   Screen negative  TDaP vaccine   Declined Hgb A1C or  GTT Early  Third trimester    Ref Range & Units 1 d ago  Glucose, Fasting 65 - 91 mg/dL 76   Glucose, 1 hour 65 - 179 mg/dL 329   Glucose, 2 hour 65 - 152 m    Past Surgical History:  Procedure Laterality Date   CHOLECYSTECTOMY N/A 01/09/2018   Procedure: LAPAROSCOPIC CHOLECYSTECTOMY WITH INTRAOPERATIVE CHOLANGIOGRAM;  Surgeon: Claud Kelp, MD;  Location: [facility];  Service: General;  Laterality: N/A;    Family History  Problem Relation Age of Onset   Diabetes Mother    Pancreatic cancer Father    Cancer Maternal Grandfather     Social History   Tobacco Use    Smoking status: Never   Smokeless tobacco: Never  Vaping Use   Vaping Use: Never used  Substance Use Topics   Alcohol use: No   Drug use: No    Allergies: No Known Allergies  Medications Prior to Admission  Medication Sig Dispense Refill Last Dose   acetaminophen (TYLENOL) 500 MG tablet Take 1,000 mg by mouth every 6 (six) hours as needed for moderate pain.      Blood Pressure Monitoring (BLOOD PRESSURE MONITOR AUTOMAT) DEVI 1 Device by Does not apply route daily. Automatic blood pressure cuff regular size. To monitor blood pressure regularly at home. ICD-10 code:Z34.90 1 each 0    Elastic Bandages & Supports (COMFORT FIT MATERNITY SUPP SM) MISC 1 Units by Does not apply route daily as needed. 1 each 0    famotidine (PEPCID) 20 MG tablet Take 1 tablet (20 mg total) by mouth 2 (two) times daily. (Patient not taking: Reported on 05/19/2021) 30 tablet 0    ferrous sulfate 325 (65 FE) MG tablet Take 1 tablet (  325 mg total) by mouth daily with breakfast. (Patient not taking: Reported on 05/19/2021) 30 tablet 0    Misc. Devices (GOJJI WEIGHT SCALE) MISC 1 Device by Does not apply route daily as needed. To weight self daily as needed at home. ICD-10 code: O09.90 1 each 0    Prenatal MV-Min-FA-Omega-3 (PRENATAL GUMMIES/DHA & FA) 0.4-32.5 MG CHEW Chew 1 Dose by mouth daily. (Patient not taking: Reported on 05/19/2021) 30 tablet 1     Review of Systems Physical Exam   Blood pressure 96/60, pulse (!) 102, temperature 98.5 F (36.9 C), temperature source Oral, resp. rate 15, height 5\' 5"  (1.651 m), weight 64.5 kg, last menstrual period 01/05/2021, SpO2 100 %, not currently breastfeeding.  Physical Exam Constitutional:      Appearance: Normal appearance.  HENT:     Head: Normocephalic and atraumatic.  Eyes:     Conjunctiva/sclera: Conjunctivae normal.  Cardiovascular:     Rate and Rhythm: Normal rate.  Pulmonary:     Effort: Pulmonary effort is normal. No respiratory distress.  Abdominal:      General: Bowel sounds are normal.     Comments: Gravid  Musculoskeletal:     Cervical back: Normal range of motion.  Skin:    General: Skin is warm and dry.  Neurological:     Mental Status: She is alert and oriented to person, place, and time.  Psychiatric:        Mood and Affect: Mood normal.        Behavior: Behavior normal.        Thought Content: Thought content normal.    MAU Course  Procedures Results for orders placed or performed during the hospital encounter of 06/06/21 (from the past 24 hour(s))  Urinalysis, Routine w reflex microscopic Urine, Clean Catch     Status: Abnormal   Collection Time: 06/06/21  4:14 PM  Result Value Ref Range   Color, Urine AMBER (A) YELLOW   APPearance CLOUDY (A) CLEAR   Specific Gravity, Urine 1.030 1.005 - 1.030   pH 6.0 5.0 - 8.0   Glucose, UA NEGATIVE NEGATIVE mg/dL   Hgb urine dipstick NEGATIVE NEGATIVE   Bilirubin Urine SMALL (A) NEGATIVE   Ketones, ur 5 (A) NEGATIVE mg/dL   Protein, ur 30 (A) NEGATIVE mg/dL   Nitrite NEGATIVE NEGATIVE   Leukocytes,Ua LARGE (A) NEGATIVE   RBC / HPF 21-50 0 - 5 RBC/hpf   WBC, UA >50 (H) 0 - 5 WBC/hpf   Bacteria, UA RARE (A) NONE SEEN   Squamous Epithelial / LPF 6-10 0 - 5   Mucus PRESENT   Wet prep, genital     Status: Abnormal   Collection Time: 06/06/21  4:14 PM   Specimen: Urine, Clean Catch  Result Value Ref Range   Yeast Wet Prep HPF POC NONE SEEN NONE SEEN   Trich, Wet Prep PRESENT (A) NONE SEEN   Clue Cells Wet Prep HPF POC NONE SEEN NONE SEEN   WBC, Wet Prep HPF POC MANY (A) NONE SEEN   Sperm NONE SEEN     MDM Exam Labs:UA, Wet prep, GC/CT Work Restrictions Assessment and Plan  25 year old, G4P3003 SIUP at 21.5 weeks Dysuria Pelvic Pressure  -Reviewed POC with patient. -Informed that labs would be collected and will call with abnormal results.  -Will also send cultures to rule out any infections. -Patient agreeable with POC. -States that she is unable to access  Mychart and provider encourages her to use instructions to reset. -Discussed  usage of maternity belt/support band while working. -Also discussed usage of compression stockings for extended standing. -Informed that provider will give note for allowing breaks at least every 2 hours for 15 minutes.  Also lifting restrictions of no more than 10 lbs. -Patient verbalizes understanding and without questions or concerns. -Encouraged to call primary office or return to MAU if symptoms worsen or with the onset of new symptoms. -Discharged to home in stable condition.   Cherre Robins, MSN, CNM 06/06/2021, 5:07 PM   Addendum 9:22 PM Trichomoniasis  -Results return positive for Trich. -Provider calls patient regarding findings and need for treatment. -Informed that trich is an STD and treatment consists of one time dose of Flagyl. -Discussed need for partner to be treated and for abstaining from sex for at least 14 days after to avoid re-infection. -Informed that TOC would be performed in 3-4 weeks. -Rx sent to pharmacy on file.   Cherre Robins MSN, CNM Advanced Practice Provider, [facility]

## 2021-06-06 NOTE — MAU Note (Signed)
Patient d/c'd from family room. Patient signed printed AVS. Placed in chart.

## 2021-06-06 NOTE — MAU Note (Signed)
...  Julie [name] is a 25 y.o. at [redacted]w[redacted]d here in MAU reporting: Pressure when urinating for the past week but states it stops shortly after. She states she also feels this pressure when she stands for longer periods like at work. She states she works at [employer]. No VB or LOF. Denies pain.   FHR: 145 doppler  BP: 96/60 P: 102 T: 98.5 R: 15 O2: 100%

## 2021-06-06 NOTE — Discharge Instructions (Signed)
   PREGNANCY SUPPORT BELT: You are not alone, Seventy-five percent of women have some sort of abdominal or back pain at some point in their pregnancy. Your baby is growing at a fast pace, which means that your whole body is rapidly trying to adjust to the changes. As your uterus grows, your back may start feeling a bit under stress and this can result in back or abdominal pain that can go from mild, and therefore bearable, to severe pains that will not allow you to sit or lay down comfortably, When it comes to dealing with pregnancy-related pains and cramps, some pregnant women usually prefer natural remedies, which the market is filled with nowadays. For example, wearing a pregnancy support belt can help ease and lessen your discomfort and pain. WHAT ARE THE BENEFITS OF WEARING A PREGNANCY SUPPORT BELT? A pregnancy support belt provides support to the lower portion of the belly taking some of the weight of the growing uterus and distributing to the other parts of your body. It is designed make you comfortable and gives you extra support. Over the years, the pregnancy apparel market has been studying the needs and wants of pregnant women and they have come up with the most comfortable pregnancy support belts that woman could ever ask for. In fact, you will no longer have to wear a stretched-out or bulky pregnancy belt that is visible underneath your clothes and makes you feel even more uncomfortable. Nowadays, a pregnancy support belt is made of comfortable and stretchy materials that will not irritate your skin but will actually make you feel at ease and you will not even notice you are wearing it. They are easy to put on and adjust during the day and can be worn at night for additional support.  BENEFITS: Relives Back pain Relieves Abdominal Muscle and Leg Pain Stabilizes the Pelvic Ring Offers a Cushioned Abdominal Lift Pad Relieves pressure on the Sciatic Nerve Within Minutes  

## 2021-06-07 ENCOUNTER — Other Ambulatory Visit: Payer: Self-pay

## 2021-06-07 DIAGNOSIS — R102 Pelvic and perineal pain: Secondary | ICD-10-CM

## 2021-06-07 DIAGNOSIS — O26899 Other specified pregnancy related conditions, unspecified trimester: Secondary | ICD-10-CM

## 2021-06-07 LAB — CULTURE, OB URINE: Culture: NO GROWTH

## 2021-06-08 LAB — GC/CHLAMYDIA PROBE AMP (~~LOC~~) NOT AT ARMC
Chlamydia: NEGATIVE
Comment: NEGATIVE
Comment: NORMAL
Neisseria Gonorrhea: NEGATIVE

## 2021-06-08 MED ORDER — COMFORT FIT MATERNITY SUPP SM MISC: 1.0000 [IU] | Freq: Every day | 0 refills | Status: DC | PRN

## 2021-06-10 ENCOUNTER — Encounter (HOSPITAL_COMMUNITY): Payer: Self-pay | Admitting: Obstetrics and Gynecology

## 2021-06-10 ENCOUNTER — Other Ambulatory Visit: Payer: Self-pay

## 2021-06-10 ENCOUNTER — Inpatient Hospital Stay (HOSPITAL_COMMUNITY)
Admission: AD | Admit: 2021-06-10 | Discharge: 2021-06-10 | Disposition: A | Discharge status: Home / Self Care | Payer: Medicaid Other | Attending: Obstetrics and Gynecology | Admitting: Obstetrics and Gynecology

## 2021-06-10 DIAGNOSIS — D649 Anemia, unspecified: Secondary | ICD-10-CM | POA: Diagnosis not present

## 2021-06-10 DIAGNOSIS — R0602 Shortness of breath: Secondary | ICD-10-CM | POA: Diagnosis present

## 2021-06-10 DIAGNOSIS — O99012 Anemia complicating pregnancy, second trimester: Secondary | ICD-10-CM | POA: Diagnosis not present

## 2021-06-10 DIAGNOSIS — O26892 Other specified pregnancy related conditions, second trimester: Secondary | ICD-10-CM | POA: Diagnosis not present

## 2021-06-10 DIAGNOSIS — A599 Trichomoniasis, unspecified: Secondary | ICD-10-CM

## 2021-06-10 DIAGNOSIS — Z3A22 22 weeks gestation of pregnancy: Secondary | ICD-10-CM | POA: Insufficient documentation

## 2021-06-10 DIAGNOSIS — A5901 Trichomonal vulvovaginitis: Secondary | ICD-10-CM

## 2021-06-10 DIAGNOSIS — O98812 Other maternal infectious and parasitic diseases complicating pregnancy, second trimester: Secondary | ICD-10-CM

## 2021-06-10 DIAGNOSIS — D6489 Other specified anemias: Secondary | ICD-10-CM | POA: Insufficient documentation

## 2021-06-10 DIAGNOSIS — O98312 Other infections with a predominantly sexual mode of transmission complicating pregnancy, second trimester: Secondary | ICD-10-CM | POA: Diagnosis not present

## 2021-06-10 DIAGNOSIS — Z3480 Encounter for supervision of other normal pregnancy, unspecified trimester: Secondary | ICD-10-CM

## 2021-06-10 HISTORY — DX: Anemia, unspecified: D64.9

## 2021-06-10 HISTORY — DX: Trichomoniasis, unspecified: A59.9

## 2021-06-10 LAB — CBC
HCT: 30.5 % — ABNORMAL LOW (ref 36.0–46.0)
Hemoglobin: 9.9 g/dL — ABNORMAL LOW (ref 12.0–15.0)
MCH: 27.3 pg (ref 26.0–34.0)
MCHC: 32.5 g/dL (ref 30.0–36.0)
MCV: 84.3 fL (ref 80.0–100.0)
Platelets: 269 10*3/uL (ref 150–400)
RBC: 3.62 MIL/uL — ABNORMAL LOW (ref 3.87–5.11)
RDW: 14.9 % (ref 11.5–15.5)
WBC: 6.9 10*3/uL (ref 4.0–10.5)
nRBC: 0 % (ref 0.0–0.2)

## 2021-06-10 LAB — URINALYSIS, ROUTINE W REFLEX MICROSCOPIC
Bilirubin Urine: NEGATIVE
Glucose, UA: NEGATIVE mg/dL
Hgb urine dipstick: NEGATIVE
Ketones, ur: NEGATIVE mg/dL
Nitrite: NEGATIVE
Protein, ur: NEGATIVE mg/dL
Specific Gravity, Urine: 1.025 (ref 1.005–1.030)
pH: 6.5 (ref 5.0–8.0)

## 2021-06-10 LAB — URINALYSIS, MICROSCOPIC (REFLEX): RBC / HPF: NONE SEEN RBC/hpf (ref 0–5)

## 2021-06-10 MED ORDER — FERROUS SULFATE 325 (65 FE) MG PO TABS: 325.0000 mg | ORAL_TABLET | ORAL | 0 refills | Status: DC

## 2021-06-10 NOTE — MAU Note (Signed)
Julie [name] is a 25 y.o. at [redacted]w[redacted]d here in MAU reporting: states it has been hard for her to breathe for the past 2-3 days since drinking a pepsi. States dyspnea occurs with exertion. No pain, bleeding, or LOF.   Onset of complaint: ongoing  Pain score: 0/10  Vitals:   06/10/21 0954  BP: (!) 98/57  Pulse: (!) 110  Resp: 18  Temp: 98.2 F (36.8 C)  SpO2: 98%     FHT:148  Lab orders placed from triage: UA

## 2021-06-10 NOTE — MAU Note (Signed)
Denies vag bleeding, abd pain, or any pain. C/o is SOB, that started 2-3 days ago.  Closed mouth breathing, unlabored resp, no use of accessory muscles, no nasal flaring noted.

## 2021-06-10 NOTE — MAU Provider Note (Signed)
History    CSN: [id number]  Arrival date and time: 06/10/21 1287   Event Date/Time   First Provider Initiated Contact with Patient 06/10/21 1025     Chief Complaint  Patient presents with   Shortness of Breath   Shortness of Breath  Julie [name] is a 25 y.o. G4P3003 at [redacted]w[redacted]d who receives care at [facility]. She presents today for SOB. She states her symptoms have been going on for 2-3 days and occurred spontaneously. Her symptoms occur only after a deep breath and are not associated with exertion. This has never happened before. She has been resting at home more than usual the last couple of days, though she usually works 8 hour shifts at [employer] 6 days a week. She denies sick contacts, asthma, or other respiratory problems. She does not smoke tobacco or other substances, though she is occasionally around those who do. She has not had any cough, congestion, chest pain, heart palpitations, fever, chills, nausea, or vomiting.  OB History     Gravida  4   Para  3   Term  3   Preterm      AB      Living  3      SAB      IAB      Ectopic      Multiple  0   Live Births  3          Past Medical History:  Diagnosis Date   Anemia    GERD (gastroesophageal reflux disease)    Supervision of other normal pregnancy, antepartum 09/16/2019    Nursing Staff Provider Office Location  [facility] Dating  Ultrasound Language  English Anatomy US  Normal, isolated EIF  Flu Vaccine  Declined Genetic Screen  NIPS:   Low risk girl AFP:   Screen negative  TDaP vaccine   Declined Hgb A1C or  GTT Early  Third trimester    Ref Range & Units 1 d ago  Glucose, Fasting 65 - 91 mg/dL 76   Glucose, 1 hour 65 - 179 mg/dL 867   Glucose, 2 hour 65 - 152 m   Trichomonas infection     Past Surgical History:  Procedure Laterality Date   CHOLECYSTECTOMY N/A 01/09/2018   Procedure: LAPAROSCOPIC CHOLECYSTECTOMY WITH INTRAOPERATIVE CHOLANGIOGRAM;  Surgeon: Claud Kelp, MD;  Location: [facility];  Service: General;  Laterality: N/A;    Family History  Problem Relation Age of Onset   Heart disease Mother    Diabetes Mother    Pancreatic cancer Father    Cancer Maternal Grandfather     Social History   Tobacco Use   Smoking status: Never   Smokeless tobacco: Never  Vaping Use   Vaping Use: Never used  Substance Use Topics   Alcohol use: No   Drug use: No    Allergies: No Known Allergies  Medications Prior to Admission  Medication Sig Dispense Refill Last Dose   acetaminophen (TYLENOL) 500 MG tablet Take 1,000 mg by mouth every 6 (six) hours as needed for moderate pain.      Blood Pressure Monitoring (BLOOD PRESSURE MONITOR AUTOMAT) DEVI 1 Device by Does not apply route daily. Automatic blood pressure cuff regular size. To monitor blood pressure regularly at home. ICD-10 code:Z34.90 1 each 0    Elastic Bandages & Supports (COMFORT FIT MATERNITY SUPP SM) MISC 1 Units by Does not apply route daily as needed. 1 each 0    ferrous sulfate 325 (65 FE)  MG tablet Take 1 tablet (325 mg total) by mouth daily with breakfast. (Patient not taking: No sig reported) 30 tablet 0    Misc. Devices (GOJJI WEIGHT SCALE) MISC 1 Device by Does not apply route daily as needed. To weight self daily as needed at home. ICD-10 code: O09.90 1 each 0    Prenatal MV-Min-FA-Omega-3 (PRENATAL GUMMIES/DHA & FA) 0.4-32.5 MG CHEW Chew 1 Dose by mouth daily. (Patient not taking: No sig reported) 30 tablet 1    Review of Systems  Constitutional: Negative.   HENT: Negative.    Respiratory:  Positive for shortness of breath.   Cardiovascular: Negative.   Gastrointestinal: Negative.   Genitourinary: Negative.    Physical Exam   Blood pressure (!) 99/55, pulse 96, temperature 98.2 F (36.8 C), temperature source Oral, resp. rate 16, height 5\' 5"  (1.651 m), weight 64.5 kg, last menstrual period 01/05/2021, SpO2 100 %, not currently breastfeeding.  Physical Exam Constitutional:       General: She is not in acute distress.    Appearance: She is well-developed.  HENT:     Head: Normocephalic and atraumatic.  Cardiovascular:     Rate and Rhythm: Regular rhythm. Tachycardia present.     Heart sounds: No murmur heard.   No friction rub. No gallop.  Pulmonary:     Effort: Pulmonary effort is normal.     Breath sounds: Normal breath sounds. No wheezing, rhonchi or rales.  Abdominal:     Palpations: Abdomen is soft.     Comments: Gravid  Neurological:     Mental Status: She is alert.    MAU Course  Procedures  MDM Exam Labs: UA, CBC, EKG, O2 monitoring  Assessment and Plan  25 yo, G4P3003 SIUP at 22 weeks SOB  Patient remained stable over visit in MAU with slight normocytic anemia of Hgb 9.9, down from 11.4, with MCV 84.3. No sign of infection with nl exam and WBC with largely unremarkable UA. O2 saturation remained normal throughout admission, and EKG showed NSR. Given overall reassuring findings, suspect SOB due to slight anemia. Her presentation is not consistent with pulmonary embolism, respiratory infection, or cardiac pathology.  -Stable for discharge -PO ferrous sulfate 325 mg every other day -F/u CBC, Iron/TIBC outpatient  30, MS3 06/10/2021, 11:55 AM

## 2021-06-10 NOTE — MAU Provider Note (Signed)
Chief Complaint:  Shortness of Breath   Event Date/Time   First Provider Initiated Contact with Patient 06/10/21 1025     HPI: Julie [name] is a 25 y.o. G4P3003 at [redacted]w[redacted]d who presents to maternity admissions reporting SOB. She states her symptoms have been going on for 2-3 days and occurred spontaneously. Her symptoms occur only after a deep breath and are not associated with exertion. This has never happened before. She is not currently experiencing symptoms. She has been resting at home more than usual the last couple of days, though she usually works 8 hour shifts at [employer] 6 days a week. She denies sick contacts, asthma, or other respiratory problems. She does not smoke tobacco or other substances, though she is occasionally around those who do. She has not had any cough, congestion, chest pain, heart palpitations, fever, chills, nausea, or vomiting.  Pregnancy Course:   Past Medical History:  Diagnosis Date   Anemia    GERD (gastroesophageal reflux disease)    Supervision of other normal pregnancy, antepartum 09/16/2019    Nursing Staff Provider Office Location  [facility] Dating  Ultrasound Language  English Anatomy US  Normal, isolated EIF  Flu Vaccine  Declined Genetic Screen  NIPS:   Low risk girl AFP:   Screen negative  TDaP vaccine   Declined Hgb A1C or  GTT Early  Third trimester    Ref Range & Units 1 d ago  Glucose, Fasting 65 - 91 mg/dL 76   Glucose, 1 hour 65 - 179 mg/dL 154   Glucose, 2 hour 65 - 152 m   Trichomonas infection    OB History  Gravida Para Term Preterm AB Living  4 3 3     3   SAB IAB Ectopic Multiple Live Births        0 3    # Outcome Date GA Lbr Len/2nd Weight Sex Delivery Anes PTL Lv  4 Current           3 Term 02/27/20 [redacted]w[redacted]d / 00:12 3011 g F Vag-Spont EPI  LIV  2 Term 04/21/19 [redacted]w[redacted]d 11:26 / 00:25 3201 g F Vag-Spont EPI  LIV     Birth Comments: WNL  1 Term 10/12/17 [redacted]w[redacted]d / 01:06 2761 g F Vag-Spont EPI  LIV     Birth Comments: WNL   Past Surgical  History:  Procedure Laterality Date   CHOLECYSTECTOMY N/A 01/09/2018   Procedure: LAPAROSCOPIC CHOLECYSTECTOMY WITH INTRAOPERATIVE CHOLANGIOGRAM;  Surgeon: 03/11/2018, MD;  Location: [facility];  Service: General;  Laterality: N/A;   Family History  Problem Relation Age of Onset   Heart disease Mother    Diabetes Mother    Pancreatic cancer Father    Cancer Maternal Grandfather    Social History   Tobacco Use   Smoking status: Never   Smokeless tobacco: Never  Vaping Use   Vaping Use: Never used  Substance Use Topics   Alcohol use: No   Drug use: No   No Known Allergies No medications prior to admission.   I have reviewed patient's Past Medical Hx, Surgical Hx, Family Hx, Social Hx, medications and allergies.   ROS:  Review of Systems  Constitutional: Negative.   HENT: Negative.    Respiratory:  Positive for shortness of breath (only with taking deep breath). Negative for cough, chest tightness and wheezing.   Cardiovascular: Negative.   Gastrointestinal: Negative.   Genitourinary: Negative.   Musculoskeletal: Negative.   Neurological: Negative.    Physical Exam  Patient Vitals for the past 24 hrs:  BP Temp Temp src Pulse Resp SpO2 Height Weight  06/10/21 1302 106/67 -- -- 96 18 -- -- --  06/10/21 1230 -- -- -- 92 -- 100 % -- --  06/10/21 1215 -- -- -- -- -- 100 % -- --  06/10/21 1200 -- -- -- -- -- 100 % -- --  06/10/21 1155 -- -- -- -- -- 100 % -- --  06/10/21 1145 -- -- -- -- -- 100 % -- --  06/10/21 1143 (!) 99/55 -- -- 96 16 100 % -- --  06/10/21 1135 -- -- -- -- -- 100 % -- --  06/10/21 1115 -- -- -- -- -- 100 % -- --  06/10/21 1100 -- -- -- -- -- 100 % -- --  06/10/21 1010 103/60 -- -- (!) 103 20 99 % -- --  06/10/21 1005 -- -- -- (!) 104 -- 100 % -- --  06/10/21 0954 (!) 98/57 98.2 F (36.8 C) Oral (!) 110 18 98 % -- --  06/10/21 0948 -- -- -- -- -- -- 5\' 5"  (1.651 m) 64.5 kg   Constitutional: well-developed, well-nourished female in  no acute distress.  Cardiovascular: normal rate, regular rhythm, no murmur Respiratory: normal effort, breath sounds clear bilaterally, no wheezing, rhonchi or rales GI: soft, non-tender, gravid MS: extremities nontender, no edema, normal ROM Neurologic: alert and oriented x 4   FHT: 148 via doppler   Labs: Results for orders placed or performed during the hospital encounter of 06/10/21 (from the past 24 hour(s))  Urinalysis, Routine w reflex microscopic Urine, Clean Catch     Status: Abnormal   Collection Time: 06/10/21 10:04 AM  Result Value Ref Range   Color, Urine YELLOW YELLOW   APPearance HAZY (A) CLEAR   Specific Gravity, Urine 1.025 1.005 - 1.030   pH 6.5 5.0 - 8.0   Glucose, UA NEGATIVE NEGATIVE mg/dL   Hgb urine dipstick NEGATIVE NEGATIVE   Bilirubin Urine NEGATIVE NEGATIVE   Ketones, ur NEGATIVE NEGATIVE mg/dL   Protein, ur NEGATIVE NEGATIVE mg/dL   Nitrite NEGATIVE NEGATIVE   Leukocytes,Ua TRACE (A) NEGATIVE  Urinalysis, Microscopic (reflex)     Status: Abnormal   Collection Time: 06/10/21 10:04 AM  Result Value Ref Range   RBC / HPF NONE SEEN 0 - 5 RBC/hpf   WBC, UA 0-5 0 - 5 WBC/hpf   Bacteria, UA FEW (A) NONE SEEN   Squamous Epithelial / LPF 6-10 0 - 5   Mucus PRESENT   CBC     Status: Abnormal   Collection Time: 06/10/21 10:41 AM  Result Value Ref Range   WBC 6.9 4.0 - 10.5 K/uL   RBC 3.62 (L) 3.87 - 5.11 MIL/uL   Hemoglobin 9.9 (L) 12.0 - 15.0 g/dL   HCT 08/10/21 (L) 95.1 - 88.4 %   MCV 84.3 80.0 - 100.0 fL   MCH 27.3 26.0 - 34.0 pg   MCHC 32.5 30.0 - 36.0 g/dL   RDW 16.6 06.3 - 01.6 %   Platelets 269 150 - 400 K/uL   nRBC 0.0 0.0 - 0.2 %    Imaging:   MAU Course: Orders Placed This Encounter  Procedures   Urinalysis, Routine w reflex microscopic Urine, Clean Catch   CBC   Urinalysis, Microscopic (reflex)   ED EKG   Discharge patient Discharge disposition: 01-Home or Self Care; Discharge patient date: 06/10/2021   Meds ordered this encounter   Medications   ferrous sulfate  325 (65 FE) MG tablet    Sig: Take 1 tablet (325 mg total) by mouth every other day.    Dispense:  30 tablet    Refill:  0    Order Specific Question:   Supervising Provider    Answer:   Reva Bores [[id number]]    MDM: CBC with a mild anemia Hgb 9.9, Hct 30.5 EKG reviewed by Dr. Crissie Reese. Normal EKG VSS. Continuous pulse ox with O2 sats 98-100%, heart rate normal.   Patient remains asymptomatic during stay. Clinical presentation reassuring. She remains in no distress. Breathing unlabored without use of accessory muscles. I suspect symptoms are likely related to mild anemia. Will send RX for PO iron. She has asked both nurse and I for work note. At this time I feel patient is stable for discharge home and strict return precautions reviewed.    Assessment: 1. Supervision of other normal pregnancy, antepartum   2. Trichomoniasis   3. [redacted] weeks gestation of pregnancy   4. Anemia affecting pregnancy in second trimester     Plan: Discharge home in stable condition Will send RX for PO iron as previously prescribed Keep appointment as scheduled on 06/24/21 Return to MAU as needed    Follow-up Information     [facility] Follow up.   Specialty: Obstetrics and Gynecology Why: keep appointment as scheduled on 9/22. Return to MAU as needed for emergencies. Contact information: [address] [place] [address] [phone]                Allergies as of 06/10/2021   No Known Allergies      Medication List     TAKE these medications    acetaminophen 500 MG tablet Commonly known as: TYLENOL Take 1,000 mg by mouth every 6 (six) hours as needed for moderate pain.   Blood Pressure Monitor Automat Devi 1 Device by Does not apply route daily. Automatic blood pressure cuff regular size. To monitor blood pressure regularly at home. ICD-10 code:Z34.90   Comfort Fit Maternity Supp Sm Misc 1 Units by Does  not apply route daily as needed.   ferrous sulfate 325 (65 FE) MG tablet Take 1 tablet (325 mg total) by mouth every other day. What changed: when to take this   Gojji Weight Scale Misc 1 Device by Does not apply route daily as needed. To weight self daily as needed at home. ICD-10 code: O09.90   Prenatal Gummies/DHA & FA 0.4-32.5 MG Chew Chew 1 Dose by mouth daily.         Camelia Eng, MSN, CNM 06/10/2021 1:08 PM

## 2021-06-16 ENCOUNTER — Ambulatory Visit: Payer: Medicaid Other

## 2021-06-24 ENCOUNTER — Other Ambulatory Visit: Payer: Self-pay

## 2021-06-24 ENCOUNTER — Ambulatory Visit (INDEPENDENT_AMBULATORY_CARE_PROVIDER_SITE_OTHER): Payer: Medicaid Other | Admitting: Obstetrics and Gynecology

## 2021-06-24 VITALS — BP 108/66 | HR 116 | Temp 98.3°F | Wt 143.8 lb

## 2021-06-24 DIAGNOSIS — A599 Trichomoniasis, unspecified: Secondary | ICD-10-CM

## 2021-06-24 DIAGNOSIS — Z3A24 24 weeks gestation of pregnancy: Secondary | ICD-10-CM

## 2021-06-24 DIAGNOSIS — O99012 Anemia complicating pregnancy, second trimester: Secondary | ICD-10-CM

## 2021-06-24 DIAGNOSIS — Z3480 Encounter for supervision of other normal pregnancy, unspecified trimester: Secondary | ICD-10-CM

## 2021-06-24 NOTE — Progress Notes (Signed)
   LOW-RISK PREGNANCY OFFICE VISIT Patient name: Julie [name] MRN [id number]  Date of birth: 1996-07-22 Chief Complaint:   Routine Prenatal Visit  History of Present Illness:   Julie [name] is a 25 y.o. G86P3003 female at [redacted]w[redacted]d with an Estimated Date of Delivery: 10/12/21 being seen today for ongoing management of a low-risk pregnancy.  Today she reports no complaints. Contractions: Not present. Vag. Bleeding: None.  Movement: Present. denies leaking of fluid. Review of Systems:   Pertinent items are noted in HPI Denies abnormal vaginal discharge w/ itching/odor/irritation, headaches, visual changes, shortness of breath, chest pain, abdominal pain, severe nausea/vomiting, or problems with urination or bowel movements unless otherwise stated above. Pertinent History Reviewed:  Reviewed past medical,surgical, social, obstetrical and family history.  Reviewed problem list, medications and allergies. Physical Assessment:   Vitals:   06/24/21 1038  BP: 108/66  Pulse: (!) 116  Temp: 98.3 F (36.8 C)  Weight: 143 lb 12.8 oz (65.2 kg)  Body mass index is 23.93 kg/m.        Physical Examination:   General appearance: Well appearing, and in no distress  Mental status: Alert, oriented to person, place, and time  Skin: Warm & dry  Cardiovascular: Normal heart rate noted  Respiratory: Normal respiratory effort, no distress  Abdomen: Soft, gravid, nontender  Pelvic: Cervical exam deferred         Extremities: Edema: Trace  Fetal Status: Fetal Heart Rate (bpm): 149 Fundal Height: 26 cm Movement: Present    No results found for this or any previous visit (from the past 24 hour(s)).  Assessment & Plan:  1) Low-risk pregnancy G4P3003 at [redacted]w[redacted]d with an Estimated Date of Delivery: 10/12/21   2) Supervision of other normal pregnancy, antepartum - Anticipatory guidance for 2 hr GTT - advised to fast after midnight without anything to eat or drink (except for water), will have fasting blood  drawn, drink the glucola drink (flavor choices: orange, fruit punch or lemon-lime), have a visit with a provider during the first hour of testing, wait in the lab waiting room to have blood drawn at 1 hour and then 2 hours after finishing glucola drink.  3) Anemia affecting pregnancy in second trimester - Taking FeSO4 supplements  4) Trichomoniasis - TOC at nv  5) [redacted] weeks gestation of pregnancy    Meds: No orders of the defined types were placed in this encounter.  Labs/procedures today: none  Plan:  Continue routine obstetrical care   Reviewed: Preterm labor symptoms and general obstetric precautions including but not limited to vaginal bleeding, contractions, leaking of fluid and fetal movement were reviewed in detail with the patient.  All questions were answered. Has home bp cuff. Check bp weekly, let us know if >140/90.   Follow-up: Return in about 4 weeks (around 07/22/2021) for Return OB 2hr GTT.  No orders of the defined types were placed in this encounter.  Raelyn Mora MSN, CNM 06/24/2021 12:47 PM

## 2021-06-28 ENCOUNTER — Ambulatory Visit (HOSPITAL_BASED_OUTPATIENT_CLINIC_OR_DEPARTMENT_OTHER): Payer: Medicaid Other

## 2021-06-28 ENCOUNTER — Other Ambulatory Visit: Payer: Self-pay

## 2021-06-28 ENCOUNTER — Encounter: Payer: Self-pay | Admitting: *Deleted

## 2021-06-28 ENCOUNTER — Ambulatory Visit: Payer: Medicaid Other | Attending: Obstetrics | Admitting: *Deleted

## 2021-06-28 VITALS — BP 110/55 | HR 111

## 2021-06-28 DIAGNOSIS — Z3A24 24 weeks gestation of pregnancy: Secondary | ICD-10-CM

## 2021-06-28 DIAGNOSIS — Z8759 Personal history of other complications of pregnancy, childbirth and the puerperium: Secondary | ICD-10-CM

## 2021-06-28 DIAGNOSIS — Z362 Encounter for other antenatal screening follow-up: Secondary | ICD-10-CM

## 2021-06-28 DIAGNOSIS — O09292 Supervision of pregnancy with other poor reproductive or obstetric history, second trimester: Secondary | ICD-10-CM | POA: Insufficient documentation

## 2021-06-28 DIAGNOSIS — O09892 Supervision of other high risk pregnancies, second trimester: Secondary | ICD-10-CM | POA: Diagnosis present

## 2021-06-28 DIAGNOSIS — Z3689 Encounter for other specified antenatal screening: Secondary | ICD-10-CM

## 2021-06-28 DIAGNOSIS — Z3480 Encounter for supervision of other normal pregnancy, unspecified trimester: Secondary | ICD-10-CM

## 2021-06-28 DIAGNOSIS — A599 Trichomoniasis, unspecified: Secondary | ICD-10-CM

## 2021-06-29 ENCOUNTER — Telehealth: Payer: Self-pay | Admitting: Obstetrics and Gynecology

## 2021-06-29 NOTE — Telephone Encounter (Signed)
Received a call today from Ms. Julie [name] stating she had been seen at the Urgent care, and was tested positive for Covid 06/29/21.

## 2021-07-15 ENCOUNTER — Encounter: Payer: Medicaid Other | Admitting: Obstetrics and Gynecology

## 2021-07-15 ENCOUNTER — Other Ambulatory Visit: Payer: Self-pay

## 2021-07-15 DIAGNOSIS — Z3480 Encounter for supervision of other normal pregnancy, unspecified trimester: Secondary | ICD-10-CM

## 2021-07-15 DIAGNOSIS — Z3A27 27 weeks gestation of pregnancy: Secondary | ICD-10-CM

## 2021-07-16 ENCOUNTER — Other Ambulatory Visit (INDEPENDENT_AMBULATORY_CARE_PROVIDER_SITE_OTHER): Payer: Medicaid Other

## 2021-07-16 ENCOUNTER — Other Ambulatory Visit: Payer: Self-pay

## 2021-07-16 ENCOUNTER — Other Ambulatory Visit (HOSPITAL_COMMUNITY)
Admission: RE | Admit: 2021-07-16 | Discharge: 2021-07-16 | Disposition: A | Discharge status: Home / Self Care | Payer: Medicaid Other | Source: Ambulatory Visit | Attending: Obstetrics and Gynecology | Admitting: Obstetrics and Gynecology

## 2021-07-16 DIAGNOSIS — Z3A27 27 weeks gestation of pregnancy: Secondary | ICD-10-CM | POA: Insufficient documentation

## 2021-07-16 DIAGNOSIS — Z202 Contact with and (suspected) exposure to infections with a predominantly sexual mode of transmission: Secondary | ICD-10-CM

## 2021-07-16 DIAGNOSIS — Z3480 Encounter for supervision of other normal pregnancy, unspecified trimester: Secondary | ICD-10-CM | POA: Insufficient documentation

## 2021-07-16 NOTE — Progress Notes (Signed)
   Pt was here today for a Vaginal Self Swab for TOC for Trichomonas and her 28 week lab work only   [employer], CMA

## 2021-07-17 LAB — CBC
Hematocrit: 29 % — ABNORMAL LOW (ref 34.0–46.6)
Hemoglobin: 9.5 g/dL — ABNORMAL LOW (ref 11.1–15.9)
MCH: 26.4 pg — ABNORMAL LOW (ref 26.6–33.0)
MCHC: 32.8 g/dL (ref 31.5–35.7)
MCV: 81 fL (ref 79–97)
Platelets: 264 10*3/uL (ref 150–450)
RBC: 3.6 x10E6/uL — ABNORMAL LOW (ref 3.77–5.28)
RDW: 14.1 % (ref 11.7–15.4)
WBC: 5.1 10*3/uL (ref 3.4–10.8)

## 2021-07-17 LAB — GLUCOSE TOLERANCE, 2 HOURS W/ 1HR
Glucose, 1 hour: 113 mg/dL (ref 70–179)
Glucose, 2 hour: 103 mg/dL (ref 70–152)
Glucose, Fasting: 78 mg/dL (ref 70–91)

## 2021-07-17 LAB — RPR: RPR Ser Ql: NONREACTIVE

## 2021-07-17 LAB — HIV ANTIBODY (ROUTINE TESTING W REFLEX): HIV Screen 4th Generation wRfx: NONREACTIVE

## 2021-07-19 ENCOUNTER — Other Ambulatory Visit: Payer: Self-pay | Admitting: *Deleted

## 2021-07-19 DIAGNOSIS — O99012 Anemia complicating pregnancy, second trimester: Secondary | ICD-10-CM

## 2021-07-19 LAB — CERVICOVAGINAL ANCILLARY ONLY
Bacterial Vaginitis (gardnerella): NEGATIVE
Candida Glabrata: NEGATIVE
Candida Vaginitis: NEGATIVE
Chlamydia: NEGATIVE
Comment: NEGATIVE
Comment: NEGATIVE
Comment: NEGATIVE
Comment: NEGATIVE
Comment: NEGATIVE
Comment: NORMAL
Neisseria Gonorrhea: NEGATIVE
Trichomonas: NEGATIVE

## 2021-07-19 MED ORDER — ASCORBIC ACID 500 MG PO TABS: 500.0000 mg | ORAL_TABLET | ORAL | 3 refills | Status: DC

## 2021-07-21 ENCOUNTER — Other Ambulatory Visit: Payer: Self-pay

## 2021-07-21 ENCOUNTER — Ambulatory Visit (INDEPENDENT_AMBULATORY_CARE_PROVIDER_SITE_OTHER): Payer: Medicaid Other

## 2021-07-21 VITALS — BP 119/75 | HR 102 | Temp 98.3°F | Wt 146.0 lb

## 2021-07-21 DIAGNOSIS — A599 Trichomoniasis, unspecified: Secondary | ICD-10-CM

## 2021-07-21 DIAGNOSIS — O99012 Anemia complicating pregnancy, second trimester: Secondary | ICD-10-CM

## 2021-07-21 DIAGNOSIS — Z3480 Encounter for supervision of other normal pregnancy, unspecified trimester: Secondary | ICD-10-CM

## 2021-07-21 MED ORDER — ASCORBIC ACID 500 MG PO TABS: 500.0000 mg | ORAL_TABLET | ORAL | 3 refills | Status: DC

## 2021-07-21 MED ORDER — FERROUS SULFATE 325 (65 FE) MG PO TABS: 325.0000 mg | ORAL_TABLET | ORAL | 0 refills | Status: DC

## 2021-07-21 MED ORDER — PRENATAL GUMMIES/DHA & FA 0.4-32.5 MG PO CHEW: 1.0000 | CHEWABLE_TABLET | Freq: Every day | ORAL | 1 refills | Status: DC

## 2021-07-21 NOTE — Progress Notes (Signed)
   PRENATAL VISIT NOTE  Subjective:  Julie [name] is a 25 y.o. G4P3003 at [redacted]w[redacted]d being seen today for ongoing prenatal care.  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  Patient reports no complaints.  Contractions: Not present. Vag. Bleeding: None.  Movement: Present. Denies leaking of fluid.   The following portions of the patient's history were reviewed and updated as appropriate: allergies, current medications, past family history, past medical history, past social history, past surgical history and problem list.   Objective:   Vitals:   07/21/21 0940  BP: 119/75  Pulse: (!) 102  Temp: 98.3 F (36.8 C)  Weight: 146 lb (66.2 kg)    Fetal Status: Fetal Heart Rate (bpm): 146 Fundal Height: 28 cm Movement: Present     General:  Alert, oriented and cooperative. Patient is in no acute distress.  Skin: Skin is warm and dry. No rash noted.   Cardiovascular: Normal heart rate noted  Respiratory: Normal respiratory effort, no problems with respiration noted  Abdomen: Soft, gravid, appropriate for gestational age.  Pain/Pressure: Absent     Pelvic: Cervical exam deferred        Extremities: Normal range of motion.  Edema: None  Mental Status: Normal mood and affect. Normal behavior. Normal judgment and thought content.   Assessment and Plan:  Pregnancy: G4P3003 at [redacted]w[redacted]d 1. Supervision of other normal pregnancy, antepartum -No complaints. Routine care -Reviewed normal GTT and labs from last visit -Patinet had COVID 9/27, reports it was just a cold and feeling well now.  2. Trichomoniasis -TOC negative  3. Anemia affecting pregnancy in second trimester - Discussed outpatient iron infusions. Patient agreeable to plan of  care. Orders placed. - ascorbic acid (VITAMIN C) 500 MG tablet; Take 1 tablet (500 mg total) by mouth every other day. Take with iron tablet.  Dispense: 30 tablet; Refill: 3  Preterm labor symptoms and general obstetric precautions including but not limited to vaginal bleeding, contractions, leaking of fluid and fetal movement were reviewed in detail with the patient. Please refer to After Visit Summary for other counseling recommendations.   No follow-ups on file.  Future Appointments  Date Time Provider Department Center  08/12/2021  9:55 AM Raelyn Mora, CNM [facility] None    Rolm Bookbinder, [place] 07/21/21 9:53 AM

## 2021-07-23 ENCOUNTER — Inpatient Hospital Stay (HOSPITAL_COMMUNITY)
Admission: AD | Admit: 2021-07-23 | Discharge: 2021-07-23 | Disposition: A | Discharge status: Home / Self Care | Payer: Medicaid Other | Attending: Obstetrics and Gynecology | Admitting: Obstetrics and Gynecology

## 2021-07-23 ENCOUNTER — Encounter (HOSPITAL_COMMUNITY): Payer: Self-pay | Admitting: Obstetrics and Gynecology

## 2021-07-23 DIAGNOSIS — O26893 Other specified pregnancy related conditions, third trimester: Secondary | ICD-10-CM | POA: Insufficient documentation

## 2021-07-23 DIAGNOSIS — R519 Headache, unspecified: Secondary | ICD-10-CM | POA: Insufficient documentation

## 2021-07-23 DIAGNOSIS — Z3A28 28 weeks gestation of pregnancy: Secondary | ICD-10-CM | POA: Diagnosis not present

## 2021-07-23 LAB — URINALYSIS, ROUTINE W REFLEX MICROSCOPIC
Bacteria, UA: NONE SEEN
Bilirubin Urine: NEGATIVE
Glucose, UA: NEGATIVE mg/dL
Hgb urine dipstick: NEGATIVE
Ketones, ur: 5 mg/dL — AB
Nitrite: NEGATIVE
Protein, ur: 30 mg/dL — AB
Specific Gravity, Urine: 1.026 (ref 1.005–1.030)
pH: 6 (ref 5.0–8.0)

## 2021-07-23 MED ORDER — BUTALBITAL-APAP-CAFFEINE 50-325-40 MG PO TABS
2.0000 | ORAL_TABLET | Freq: Once | ORAL | Status: AC
  Administered 2021-07-23: 2 via ORAL
  Filled 2021-07-23: qty 2

## 2021-07-23 NOTE — Telephone Encounter (Signed)
Patient called reporting she picked up her Iron tablets, but the pharmacy did not have Vitamin C. Advised she has to purchased medication over the counter. Patient reported headache x 2 days with seeing spots. Advised patient she need to be seen at [facility]. Patient is currently at work, advised to go to [facility] now. She can get a note from [facility], if not nurse will provide one on Monday when the office is opened. [facility]U called, spoke with Denny Peon, NP that patient should be arriving soon.  Clovis Pu, RN

## 2021-07-23 NOTE — MAU Provider Note (Signed)
History     CSN: [id number]  Arrival date and time: 07/23/21 1039   Event Date/Time   First Provider Initiated Contact with Patient 07/23/21 1136      Chief Complaint  Patient presents with   Headache   HPI  Julie [name] is a 25 y.o. female @  [redacted]w[redacted]d with a history of HA's here with a 2 day headache. She was told by her OB office to come in. She has not tried anything at home for the headache. The HA is located in her temples. The pain is constant. She currently rates her HA pain 7/10.  This is not the worse headache of her life.   OB History     Gravida  4   Para  3   Term  3   Preterm      AB      Living  3      SAB      IAB      Ectopic      Multiple  0   Live Births  3           Past Medical History:  Diagnosis Date   Anemia    GERD (gastroesophageal reflux disease)    Supervision of other normal pregnancy, antepartum 09/16/2019    Nursing Staff Provider Office Location  [facility] Dating  Ultrasound Language  English Anatomy US  Normal, isolated EIF  Flu Vaccine  Declined Genetic Screen  NIPS:   Low risk girl AFP:   Screen negative  TDaP vaccine   Declined Hgb A1C or  GTT Early  Third trimester    Ref Range & Units 1 d ago  Glucose, Fasting 65 - 91 mg/dL 76   Glucose, 1 hour 65 - 179 mg/dL 132   Glucose, 2 hour 65 - 152 m   Trichomonas infection     Past Surgical History:  Procedure Laterality Date   CHOLECYSTECTOMY N/A 01/09/2018   Procedure: LAPAROSCOPIC CHOLECYSTECTOMY WITH INTRAOPERATIVE CHOLANGIOGRAM;  Surgeon: Claud Kelp, MD;  Location: [facility];  Service: General;  Laterality: N/A;    Family History  Problem Relation Age of Onset   Heart disease Mother    Diabetes Mother    Pancreatic cancer Father    Cancer Maternal Grandfather     Social History   Tobacco Use   Smoking status: Never   Smokeless tobacco: Never  Vaping Use   Vaping Use: Never used  Substance Use Topics   Alcohol use: No   Drug  use: No    Allergies: No Known Allergies  Medications Prior to Admission  Medication Sig Dispense Refill Last Dose   ferrous sulfate 325 (65 FE) MG tablet Take 1 tablet (325 mg total) by mouth every other day. 30 tablet 0 07/23/2021   acetaminophen (TYLENOL) 500 MG tablet Take 1,000 mg by mouth every 6 (six) hours as needed for moderate pain. (Patient not taking: No sig reported)      ascorbic acid (VITAMIN C) 500 MG tablet Take 1 tablet (500 mg total) by mouth every other day. Take with iron tablet. 30 tablet 3    Blood Pressure Monitoring (BLOOD PRESSURE MONITOR AUTOMAT) DEVI 1 Device by Does not apply route daily. Automatic blood pressure cuff regular size. To monitor blood pressure regularly at home. ICD-10 code:Z34.90 1 each 0    Elastic Bandages & Supports (COMFORT FIT MATERNITY SUPP SM) MISC 1 Units by Does not apply route daily as needed. (Patient not taking: No  sig reported) 1 each 0    Misc. Devices (GOJJI WEIGHT SCALE) MISC 1 Device by Does not apply route daily as needed. To weight self daily as needed at home. ICD-10 code: O09.90 1 each 0    Prenatal MV-Min-FA-Omega-3 (PRENATAL GUMMIES/DHA & FA) 0.4-32.5 MG CHEW Chew 1 Dose by mouth daily. 30 tablet 1    Results for orders placed or performed during the hospital encounter of 07/23/21 (from the past 24 hour(s))  Urinalysis, Routine w reflex microscopic Urine, Clean Catch     Status: Abnormal   Collection Time: 07/23/21 11:09 AM  Result Value Ref Range   Color, Urine YELLOW YELLOW   APPearance HAZY (A) CLEAR   Specific Gravity, Urine 1.026 1.005 - 1.030   pH 6.0 5.0 - 8.0   Glucose, UA NEGATIVE NEGATIVE mg/dL   Hgb urine dipstick NEGATIVE NEGATIVE   Bilirubin Urine NEGATIVE NEGATIVE   Ketones, ur 5 (A) NEGATIVE mg/dL   Protein, ur 30 (A) NEGATIVE mg/dL   Nitrite NEGATIVE NEGATIVE   Leukocytes,Ua TRACE (A) NEGATIVE   RBC / HPF 0-5 0 - 5 RBC/hpf   WBC, UA 21-50 0 - 5 WBC/hpf   Bacteria, UA NONE SEEN NONE SEEN   Squamous  Epithelial / LPF 0-5 0 - 5   Mucus PRESENT     Review of Systems  Eyes:  Positive for photophobia. Negative for visual disturbance.  Gastrointestinal:  Negative for nausea and vomiting.  Neurological:  Positive for headaches. Negative for seizures.  Physical Exam   Blood pressure 112/64, pulse (!) 101, temperature 97.9 F (36.6 C), resp. rate 18, last menstrual period 01/05/2021, not currently breastfeeding.  Physical Exam Constitutional:      Appearance: She is well-developed.  HENT:     Head: Normocephalic.  Eyes:     Extraocular Movements: Extraocular movements intact.  Musculoskeletal:        General: Normal range of motion.     Cervical back: Neck supple.  Neurological:     Mental Status: She is alert and oriented to person, place, and time.     GCS: GCS eye subscore is 4. GCS verbal subscore is 5. GCS motor subscore is 6.  Psychiatric:        Behavior: Behavior normal.   Fetal Tracing: Baseline: 125 bpm Variability: Moderate  Accelerations: 10x10 Decelerations: None Toco:  None  MAU Course  Procedures None  MDM  Fioricet given 2 tablets, she does have a ride home from the MAU.   Assessment and Plan   A:  1. Headache in pregnancy, antepartum, third trimester   2. [redacted] weeks gestation of pregnancy      P:  Discharge home in stable condition Ok to use tylenol for HA's  Increase oral fluid intake Return to MAU if symptoms worsen

## 2021-07-23 NOTE — MAU Note (Signed)
Pt stated she started having a headache yesterday(did not take anything for it tried to sleep it off.) still has HA today and is seeing spots. Good fetal movement reports Denis any vag bleeding or leaking or cramping.

## 2021-08-02 ENCOUNTER — Ambulatory Visit (HOSPITAL_COMMUNITY)
Admission: RE | Admit: 2021-08-02 | Discharge: 2021-08-02 | Disposition: A | Discharge status: Home / Self Care | Payer: Medicaid Other | Source: Ambulatory Visit | Attending: Family Medicine | Admitting: Family Medicine

## 2021-08-02 ENCOUNTER — Other Ambulatory Visit: Payer: Self-pay

## 2021-08-02 DIAGNOSIS — D649 Anemia, unspecified: Secondary | ICD-10-CM | POA: Insufficient documentation

## 2021-08-02 DIAGNOSIS — Z3A00 Weeks of gestation of pregnancy not specified: Secondary | ICD-10-CM | POA: Diagnosis not present

## 2021-08-02 DIAGNOSIS — O99012 Anemia complicating pregnancy, second trimester: Secondary | ICD-10-CM | POA: Insufficient documentation

## 2021-08-02 MED ORDER — IRON SUCROSE 20 MG/ML IV SOLN
500.0000 mg | INTRAVENOUS | Status: DC
  Administered 2021-08-02: 500 mg via INTRAVENOUS
  Filled 2021-08-02: qty 25

## 2021-08-09 ENCOUNTER — Ambulatory Visit (HOSPITAL_COMMUNITY)
Admission: RE | Admit: 2021-08-09 | Discharge: 2021-08-09 | Disposition: A | Discharge status: Home / Self Care | Payer: Medicaid Other | Source: Ambulatory Visit | Attending: Family Medicine | Admitting: Family Medicine

## 2021-08-09 ENCOUNTER — Other Ambulatory Visit: Payer: Self-pay

## 2021-08-09 DIAGNOSIS — D649 Anemia, unspecified: Secondary | ICD-10-CM | POA: Diagnosis not present

## 2021-08-09 DIAGNOSIS — O99012 Anemia complicating pregnancy, second trimester: Secondary | ICD-10-CM | POA: Insufficient documentation

## 2021-08-09 DIAGNOSIS — Z3A00 Weeks of gestation of pregnancy not specified: Secondary | ICD-10-CM | POA: Insufficient documentation

## 2021-08-09 MED ORDER — SODIUM CHLORIDE 0.9 % IV SOLN
500.0000 mg | INTRAVENOUS | Status: DC
  Administered 2021-08-09: 500 mg via INTRAVENOUS
  Filled 2021-08-09: qty 25

## 2021-08-09 NOTE — Progress Notes (Signed)
Pt here today for second dose of venofer and is pregnant.  At the completion of her infusion she said she had an "achy pain 7/10 in lower abdomen".  She denies nausea, and denies pain anywhere else.  No other symptoms noted.  VSS and FHR stable.  After 30 min of observation time the patient's VS remained stable and she stated the pain "felt like a braxton hicks and was no worse."  Pt was sitting up on the side of the bed and stated she felt like she was okay to go home and could ambulate out. I let her know we could get a wheelchair and take her out but she said she felt like she could walk.  I instructed her that if her pain got worse to call her OBGYN and or go to the hospital and she stated she would.  Pt DC home.

## 2021-08-12 ENCOUNTER — Encounter: Payer: Medicaid Other | Admitting: Obstetrics and Gynecology

## 2021-08-14 ENCOUNTER — Encounter (HOSPITAL_COMMUNITY): Payer: Self-pay | Admitting: Obstetrics and Gynecology

## 2021-08-14 ENCOUNTER — Inpatient Hospital Stay (HOSPITAL_COMMUNITY)
Admission: AD | Admit: 2021-08-14 | Discharge: 2021-08-14 | Disposition: A | Discharge status: Home / Self Care | Payer: Medicaid Other | Attending: Obstetrics and Gynecology | Admitting: Obstetrics and Gynecology

## 2021-08-14 ENCOUNTER — Other Ambulatory Visit: Payer: Self-pay

## 2021-08-14 DIAGNOSIS — O26893 Other specified pregnancy related conditions, third trimester: Secondary | ICD-10-CM | POA: Diagnosis not present

## 2021-08-14 DIAGNOSIS — O26891 Other specified pregnancy related conditions, first trimester: Secondary | ICD-10-CM | POA: Insufficient documentation

## 2021-08-14 DIAGNOSIS — T8089XA Other complications following infusion, transfusion and therapeutic injection, initial encounter: Secondary | ICD-10-CM | POA: Insufficient documentation

## 2021-08-14 DIAGNOSIS — T454X5A Adverse effect of iron and its compounds, initial encounter: Secondary | ICD-10-CM | POA: Insufficient documentation

## 2021-08-14 DIAGNOSIS — Y838 Other surgical procedures as the cause of abnormal reaction of the patient, or of later complication, without mention of misadventure at the time of the procedure: Secondary | ICD-10-CM | POA: Insufficient documentation

## 2021-08-14 DIAGNOSIS — Z3A31 31 weeks gestation of pregnancy: Secondary | ICD-10-CM | POA: Diagnosis not present

## 2021-08-14 DIAGNOSIS — T50905A Adverse effect of unspecified drugs, medicaments and biological substances, initial encounter: Secondary | ICD-10-CM

## 2021-08-14 DIAGNOSIS — R21 Rash and other nonspecific skin eruption: Secondary | ICD-10-CM | POA: Insufficient documentation

## 2021-08-14 MED ORDER — PREDNISONE 20 MG PO TABS: 60.0000 mg | ORAL_TABLET | Freq: Every day | ORAL | 0 refills | Status: DC

## 2021-08-14 MED ORDER — HYDROXYZINE PAMOATE 25 MG PO CAPS: 25.0000 mg | ORAL_CAPSULE | Freq: Three times a day (TID) | ORAL | 0 refills | Status: DC | PRN

## 2021-08-14 NOTE — MAU Provider Note (Signed)
History     CSN: [id number]  Arrival date and time: 08/14/21 1616   Event Date/Time   First Provider Initiated Contact with Patient 08/14/21 1741      Chief Complaint  Patient presents with   Rash   HPI  [name] is a 25 y.o. female [phone] @ [redacted]w[redacted]d here in MAU with complaints of itchy bumps which popped up after an infusion of Venofer on 11/7. She reports itchy bumps are popping up on her ankles, arms, and upper back. She has not tried anything for the itchy bumps. She denies SOB, trouble breathing or swallowing. She denies swelling in her face.  + fetal movement. She just wants to be sure everything is ok.   OB History     Gravida  4   Para  3   Term  3   Preterm      AB      Living  3      SAB      IAB      Ectopic      Multiple  0   Live Births  3           Past Medical History:  Diagnosis Date   Anemia    GERD (gastroesophageal reflux disease)    Supervision of other normal pregnancy, antepartum 09/16/2019    Nursing Staff Provider Office Location  [facility] Dating  Ultrasound Language  English Anatomy US  Normal, isolated EIF  Flu Vaccine  Declined Genetic Screen  NIPS:   Low risk girl AFP:   Screen negative  TDaP vaccine   Declined Hgb A1C or  GTT Early  Third trimester    Ref Range & Units 1 d ago  Glucose, Fasting 65 - 91 mg/dL 76   Glucose, 1 hour 65 - 179 mg/dL 270   Glucose, 2 hour 65 - 152 m   Trichomonas infection     Past Surgical History:  Procedure Laterality Date   CHOLECYSTECTOMY N/A 01/09/2018   Procedure: LAPAROSCOPIC CHOLECYSTECTOMY WITH INTRAOPERATIVE CHOLANGIOGRAM;  Surgeon: Claud Kelp, MD;  Location: [facility];  Service: General;  Laterality: N/A;    Family History  Problem Relation Age of Onset   Heart disease Mother    Diabetes Mother    Pancreatic cancer Father    Cancer Maternal Grandfather     Social History   Tobacco Use   Smoking status: Never   Smokeless tobacco: Never   Vaping Use   Vaping Use: Never used  Substance Use Topics   Alcohol use: No   Drug use: No    Allergies: No Known Allergies  Medications Prior to Admission  Medication Sig Dispense Refill Last Dose   acetaminophen (TYLENOL) 500 MG tablet Take 1,000 mg by mouth every 6 (six) hours as needed for moderate pain. (Patient not taking: No sig reported)      ascorbic acid (VITAMIN C) 500 MG tablet Take 1 tablet (500 mg total) by mouth every other day. Take with iron tablet. 30 tablet 3    Blood Pressure Monitoring (BLOOD PRESSURE MONITOR AUTOMAT) DEVI 1 Device by Does not apply route daily. Automatic blood pressure cuff regular size. To monitor blood pressure regularly at home. ICD-10 code:Z34.90 1 each 0    Elastic Bandages & Supports (COMFORT FIT MATERNITY SUPP SM) MISC 1 Units by Does not apply route daily as needed. (Patient not taking: No sig reported) 1 each 0    ferrous sulfate 325 (65 FE) MG tablet Take  1 tablet (325 mg total) by mouth every other day. 30 tablet 0    Misc. Devices (GOJJI WEIGHT SCALE) MISC 1 Device by Does not apply route daily as needed. To weight self daily as needed at home. ICD-10 code: O09.90 1 each 0    Prenatal MV-Min-FA-Omega-3 (PRENATAL GUMMIES/DHA & FA) 0.4-32.5 MG CHEW Chew 1 Dose by mouth daily. 30 tablet 1    No results found for this or any previous visit (from the past 48 hour(s)).   Review of Systems  Respiratory:  Negative for cough and shortness of breath.   Skin:  Positive for rash.  Physical Exam   Blood pressure (!) 103/51, pulse (!) 119, temperature 98.4 F (36.9 C), temperature source Oral, resp. rate 19, last menstrual period 01/05/2021, SpO2 98 %, not currently breastfeeding.  Physical Exam Vitals and nursing note reviewed.  Constitutional:      General: She is not in acute distress.    Appearance: Normal appearance. She is not ill-appearing, toxic-appearing or diaphoretic.  HENT:     Head: Normocephalic.  Skin:    General: Skin is  warm.     Findings: Rash present. Rash is macular, papular and urticarial. Rash is not pustular.  Neurological:     Mental Status: She is alert and oriented to person, place, and time.  Psychiatric:        Behavior: Behavior normal.       Fetal Tracing: Baseline: 130 bpm Variability: Moderate  Accelerations: 15x15 Decelerations: None Toco:  quiet.   MAU Course  Procedures None  MDM  Pulse ox 98% on RA Patient is well appearing, no distress Reviewed treatment options with Dr. Laymond Purser.   Assessment and Plan   A:  Rash - Plan: Discharge patient  Medication reaction, initial encounter - Plan: Discharge patient  [redacted] weeks gestation of pregnancy - Plan: Discharge patient   P:  Discharge home Rx: Prednisone, vistaril Return to MAU if symptoms worsen    Venia Carbon I, NP 08/16/2021 10:06 AM

## 2021-08-14 NOTE — MAU Note (Signed)
Julie [name] is a 25 y.o. at [redacted]w[redacted]d here in MAU reporting: had an iron infusion 3 days ago, while she was getting the infusion she started getting a rash and now it is spreading. Having lower abdominal pain when she walks. Denies bleeding or LOF. +FM  Onset of complaint: ongoing  Pain score: 0/10  Vitals:   08/14/21 1629  BP: 109/63  Pulse: (!) 109  Resp: 19  Temp: 98.4 F (36.9 C)  SpO2: 100%     FHT: 143  Lab orders placed from triage: none

## 2021-08-15 ENCOUNTER — Encounter (HOSPITAL_COMMUNITY): Payer: Self-pay | Admitting: Family Medicine

## 2021-08-15 ENCOUNTER — Inpatient Hospital Stay (HOSPITAL_COMMUNITY)
Admission: AD | Admit: 2021-08-15 | Discharge: 2021-08-15 | Disposition: A | Discharge status: Home / Self Care | Payer: Medicaid Other | Attending: Family Medicine | Admitting: Family Medicine

## 2021-08-15 DIAGNOSIS — Z0371 Encounter for suspected problem with amniotic cavity and membrane ruled out: Secondary | ICD-10-CM | POA: Diagnosis not present

## 2021-08-15 DIAGNOSIS — Z3A31 31 weeks gestation of pregnancy: Secondary | ICD-10-CM

## 2021-08-15 LAB — WET PREP, GENITAL
Clue Cells Wet Prep HPF POC: NONE SEEN
Sperm: NONE SEEN
Trich, Wet Prep: NONE SEEN
Yeast Wet Prep HPF POC: NONE SEEN

## 2021-08-15 LAB — URINALYSIS, ROUTINE W REFLEX MICROSCOPIC
Bacteria, UA: NONE SEEN
Bilirubin Urine: NEGATIVE
Glucose, UA: NEGATIVE mg/dL
Hgb urine dipstick: NEGATIVE
Ketones, ur: NEGATIVE mg/dL
Nitrite: NEGATIVE
Protein, ur: NEGATIVE mg/dL
Specific Gravity, Urine: 1.017 (ref 1.005–1.030)
pH: 6 (ref 5.0–8.0)

## 2021-08-15 NOTE — MAU Note (Signed)
Julie [name] is a 25 y.o. at [redacted]w[redacted]d here in MAU reporting: LOF ongoing for at least a week. States it is white or clear and sometimes drips down her leg. Denies bleeding. Having some cramping that is also ongoing. Feels pressure when she pees. +FM  Onset of complaint: ongoing  Pain score: 3/10  Vitals:   08/15/21 1141  BP: 111/62  Pulse: (!) 105  Resp: 16  Temp: 97.7 F (36.5 C)  SpO2: 100%     FHT: +FM, EFM applied in room  Lab orders placed from triage: UA, unable to give sample at this time

## 2021-08-15 NOTE — MAU Provider Note (Signed)
History     CSN: [id number]  Arrival date and time: 08/15/21 1119   Event Date/Time   First Provider Initiated Contact with Patient 08/15/21 1148      Chief Complaint  Patient presents with   Abdominal Pain   Rupture of Membranes   HPI Julie [name] is a 25 y.o. G4P3003 at [redacted]w[redacted]d who presents with vaginal discharge and concern that her water may be broke. She states for the last week she has had clear discharge that is running down her leg. She denies any bleeding. She also reports pelvic pressure and occasional cramping. She reports normal fetal movement.   OB History     Gravida  4   Para  3   Term  3   Preterm      AB      Living  3      SAB      IAB      Ectopic      Multiple  0   Live Births  3           Past Medical History:  Diagnosis Date   Anemia    GERD (gastroesophageal reflux disease)    Supervision of other normal pregnancy, antepartum 09/16/2019    Nursing Staff Provider Office Location  [facility] Dating  Ultrasound Language  English Anatomy US  Normal, isolated EIF  Flu Vaccine  Declined Genetic Screen  NIPS:   Low risk girl AFP:   Screen negative  TDaP vaccine   Declined Hgb A1C or  GTT Early  Third trimester    Ref Range & Units 1 d ago  Glucose, Fasting 65 - 91 mg/dL 76   Glucose, 1 hour 65 - 179 mg/dL 104   Glucose, 2 hour 65 - 152 m   Trichomonas infection     Past Surgical History:  Procedure Laterality Date   CHOLECYSTECTOMY N/A 01/09/2018   Procedure: LAPAROSCOPIC CHOLECYSTECTOMY WITH INTRAOPERATIVE CHOLANGIOGRAM;  Surgeon: Fanny Skates, MD;  Location: [facility];  Service: General;  Laterality: N/A;    Family History  Problem Relation Age of Onset   Heart disease Mother    Diabetes Mother    Pancreatic cancer Father    Cancer Maternal Grandfather     Social History   Tobacco Use   Smoking status: Never   Smokeless tobacco: Never  Vaping Use   Vaping Use: Never used  Substance Use Topics    Alcohol use: No   Drug use: No    Allergies: No Known Allergies  Medications Prior to Admission  Medication Sig Dispense Refill Last Dose   acetaminophen (TYLENOL) 500 MG tablet Take 1,000 mg by mouth every 6 (six) hours as needed for moderate pain. (Patient not taking: No sig reported)      ascorbic acid (VITAMIN C) 500 MG tablet Take 1 tablet (500 mg total) by mouth every other day. Take with iron tablet. 30 tablet 3    Blood Pressure Monitoring (BLOOD PRESSURE MONITOR AUTOMAT) DEVI 1 Device by Does not apply route daily. Automatic blood pressure cuff regular size. To monitor blood pressure regularly at home. ICD-10 code:Z34.90 1 each 0    Elastic Bandages & Supports (COMFORT FIT MATERNITY SUPP SM) MISC 1 Units by Does not apply route daily as needed. (Patient not taking: No sig reported) 1 each 0    ferrous sulfate 325 (65 FE) MG tablet Take 1 tablet (325 mg total) by mouth every other day. 30 tablet 0  hydrOXYzine (VISTARIL) 25 MG capsule Take 1 capsule (25 mg total) by mouth 3 (three) times daily as needed. 30 capsule 0    Misc. Devices (GOJJI WEIGHT SCALE) MISC 1 Device by Does not apply route daily as needed. To weight self daily as needed at home. ICD-10 code: O09.90 1 each 0    predniSONE (DELTASONE) 20 MG tablet Take 3 tablets (60 mg total) by mouth daily with breakfast. 12 tablet 0    Prenatal MV-Min-FA-Omega-3 (PRENATAL GUMMIES/DHA & FA) 0.4-32.5 MG CHEW Chew 1 Dose by mouth daily. 30 tablet 1     Review of Systems  Constitutional: Negative.  Negative for fatigue and fever.  HENT: Negative.    Respiratory: Negative.  Negative for shortness of breath.   Cardiovascular: Negative.  Negative for chest pain.  Gastrointestinal:  Positive for abdominal pain. Negative for constipation, diarrhea, nausea and vomiting.  Genitourinary:  Positive for vaginal discharge. Negative for dysuria and vaginal bleeding.  Neurological: Negative.  Negative for dizziness and headaches.  Physical Exam    Blood pressure 111/62, pulse (!) 105, temperature 97.7 F (36.5 C), temperature source Oral, resp. rate 16, last menstrual period 01/05/2021, SpO2 100 %, not currently breastfeeding.  Physical Exam Vitals and nursing note reviewed.  Constitutional:      General: She is not in acute distress.    Appearance: She is well-developed.  HENT:     Head: Normocephalic.  Eyes:     Pupils: Pupils are equal, round, and reactive to light.  Cardiovascular:     Rate and Rhythm: Normal rate and regular rhythm.     Heart sounds: Normal heart sounds.  Pulmonary:     Effort: Pulmonary effort is normal. No respiratory distress.     Breath sounds: Normal breath sounds.  Abdominal:     General: Bowel sounds are normal. There is no distension.     Palpations: Abdomen is soft.     Tenderness: There is no abdominal tenderness.  Genitourinary:    Vagina: No bleeding (.cerv).     Comments: SSE: Cervix pink, visually closed, without lesion, scant white creamy discharge, vaginal walls and external genitalia normal, Cervix 0/long/high  Skin:    General: Skin is warm and dry.  Neurological:     Mental Status: She is alert and oriented to person, place, and time.  Psychiatric:        Mood and Affect: Mood normal.        Behavior: Behavior normal.        Thought Content: Thought content normal.        Judgment: Judgment normal.   Fetal Tracing:  Baseline: 130 Variability: moderate Accels: 15x15 Decels: none  Toco: none   MAU Course  Procedures Results for orders placed or performed during the hospital encounter of 08/15/21 (from the past 24 hour(s))  Urinalysis, Routine w reflex microscopic Urine, Clean Catch     Status: Abnormal   Collection Time: 08/15/21 11:53 AM  Result Value Ref Range   Color, Urine YELLOW YELLOW   APPearance HAZY (A) CLEAR   Specific Gravity, Urine 1.017 1.005 - 1.030   pH 6.0 5.0 - 8.0   Glucose, UA NEGATIVE NEGATIVE mg/dL   Hgb urine dipstick NEGATIVE NEGATIVE    Bilirubin Urine NEGATIVE NEGATIVE   Ketones, ur NEGATIVE NEGATIVE mg/dL   Protein, ur NEGATIVE NEGATIVE mg/dL   Nitrite NEGATIVE NEGATIVE   Leukocytes,Ua MODERATE (A) NEGATIVE   RBC / HPF 0-5 0 - 5 RBC/hpf   WBC, UA 6-10  0 - 5 WBC/hpf   Bacteria, UA NONE SEEN NONE SEEN   Squamous Epithelial / LPF 11-20 0 - 5   Mucus PRESENT   Wet prep, genital     Status: Abnormal   Collection Time: 08/15/21 11:53 AM  Result Value Ref Range   Yeast Wet Prep HPF POC NONE SEEN NONE SEEN   Trich, Wet Prep NONE SEEN NONE SEEN   Clue Cells Wet Prep HPF POC NONE SEEN NONE SEEN   WBC, Wet Prep HPF POC FEW (A) NONE SEEN   Sperm NONE SEEN     MDM UA, UC Wet prep and gc/chlamydia  Assessment and Plan   1. Encounter for suspected premature rupture of amniotic membranes, with rupture of membranes not found   2. [redacted] weeks gestation of pregnancy    -Discharge home in stable condition -Preterm labor precautions discussed -Patient advised to follow-up with OB as scheduled for prenatal care -Patient may return to MAU as needed or if her condition were to change or worsen   Wende Mott CNM 08/15/2021, 11:48 AM

## 2021-08-15 NOTE — Discharge Instructions (Signed)

## 2021-08-16 LAB — GC/CHLAMYDIA PROBE AMP (~~LOC~~) NOT AT ARMC
Chlamydia: NEGATIVE
Comment: NEGATIVE
Comment: NORMAL
Neisseria Gonorrhea: NEGATIVE

## 2021-08-17 LAB — CULTURE, OB URINE: Culture: NO GROWTH

## 2021-08-18 ENCOUNTER — Encounter: Payer: Medicaid Other | Admitting: Obstetrics and Gynecology

## 2021-08-28 IMAGING — US US OB COMP LESS 14 WK
1 series · 15 of 27 positions shown · non-contrast
Comparison: None.

CLINICAL DATA: Abdominal pain affecting 1st trimester pregnancy.

EXAM:
OBSTETRIC <14 WK ULTRASOUND
TECHNIQUE: Transabdominal ultrasound was performed for evaluation of the
gestation as well as the maternal uterus and adnexal regions.

[Series 1: us ob comp less 14 wk · 27 acquisitions, 15 frames shown]
[im 1/27]
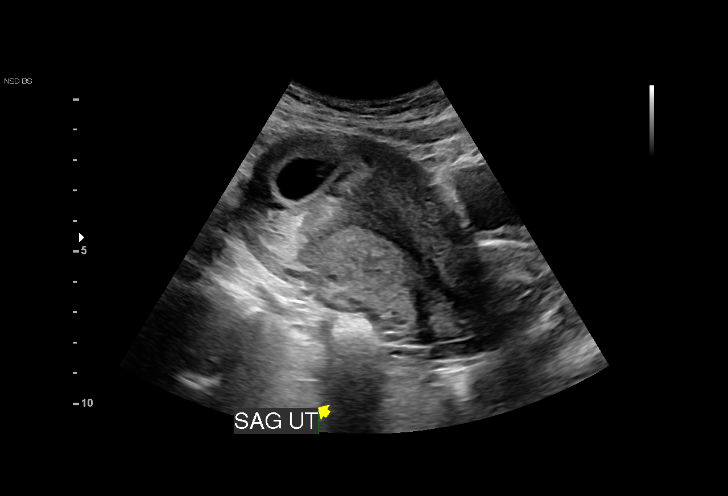
[im 3/27]
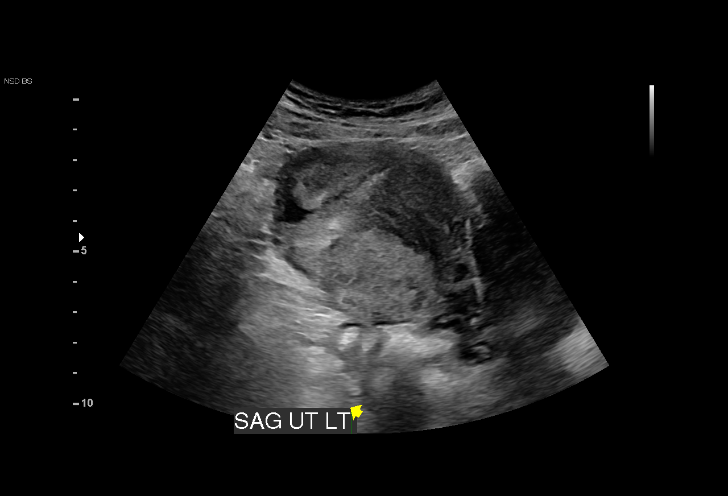
[im 5/27]
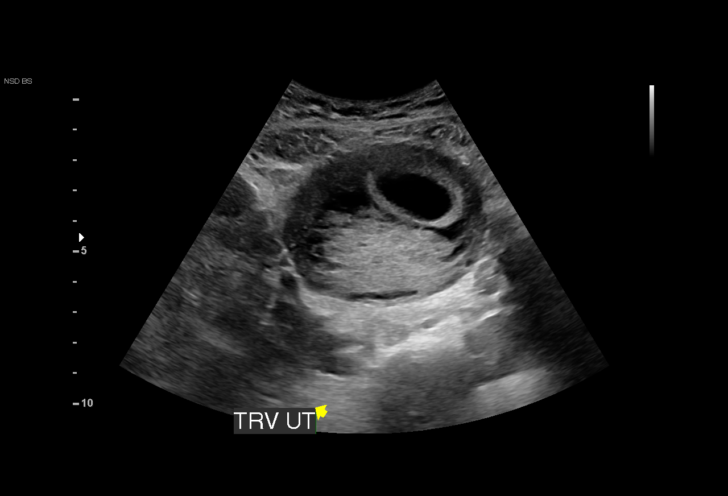
[im 7/27]
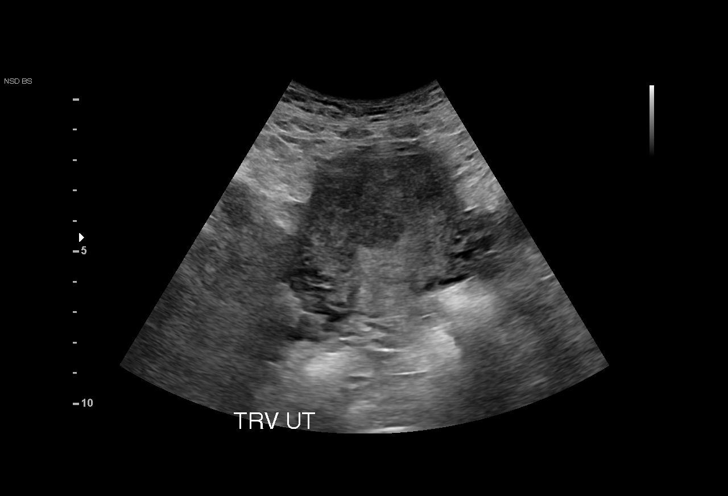
[im 9/27]
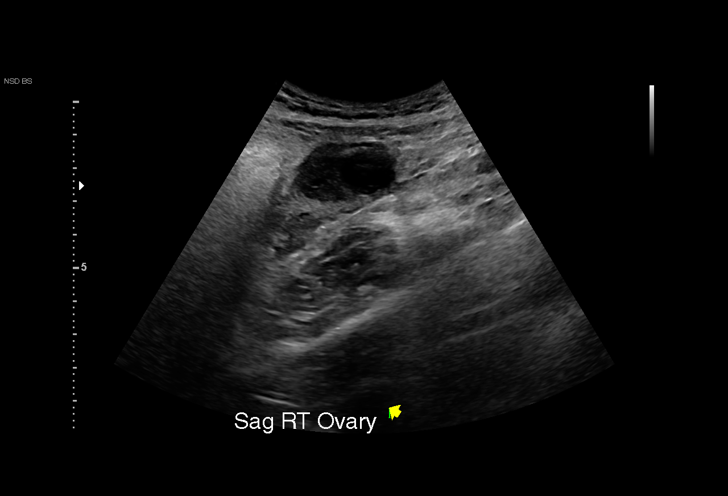
[im 10/27]
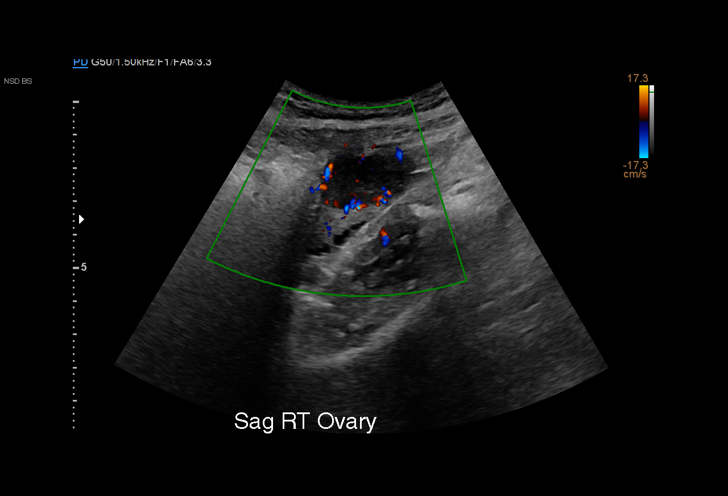
[im 12/27]
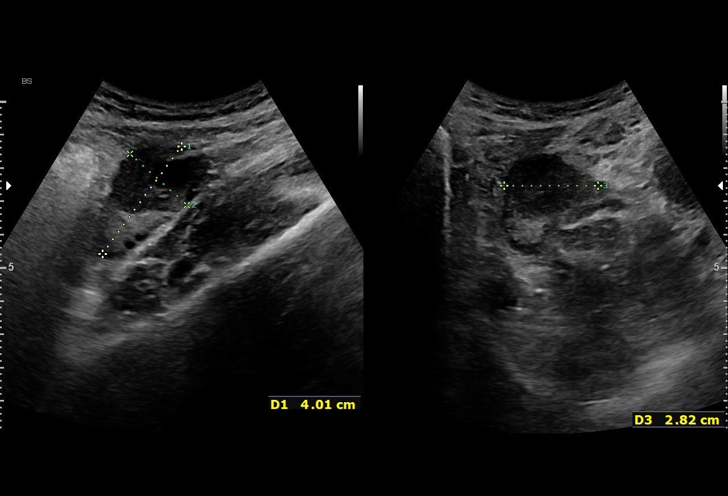
[im 14/27]
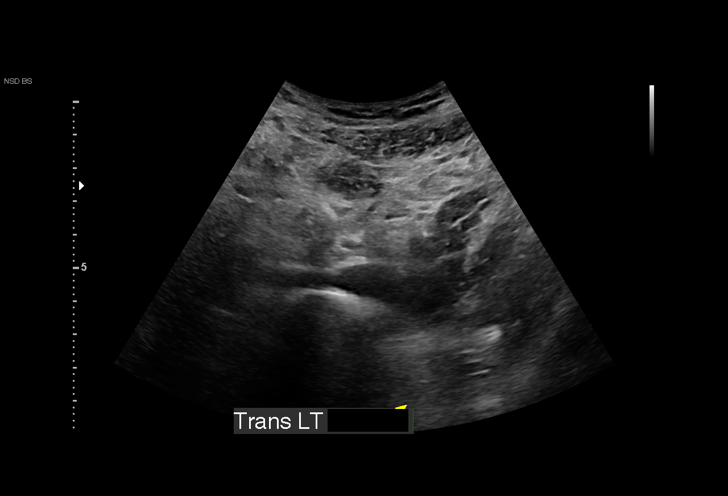
[im 16/27]
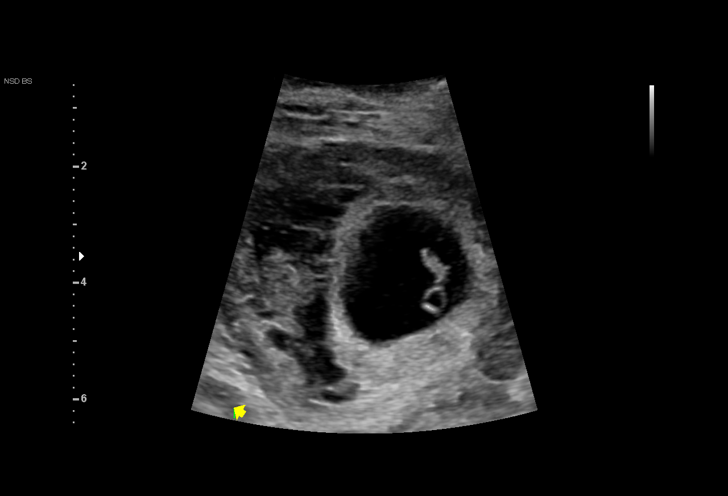
[im 18/27]
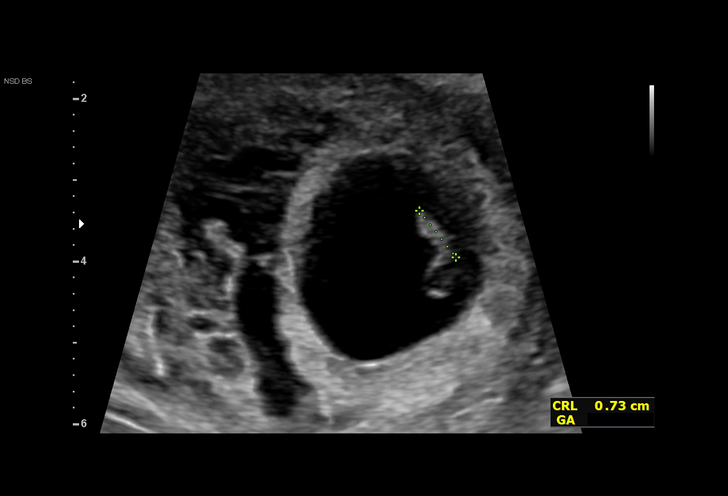
[im 19/27]
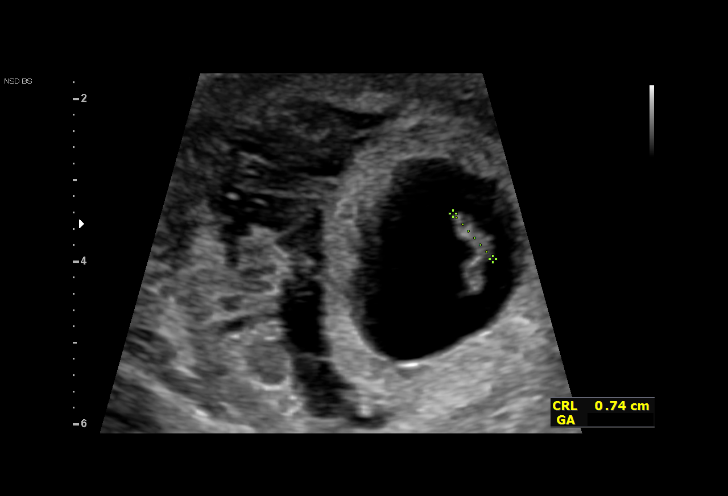
[im 21/27]
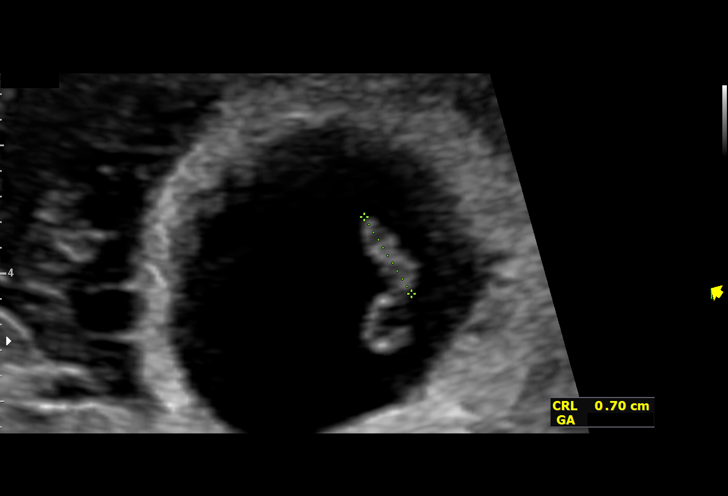
[im 23/27]
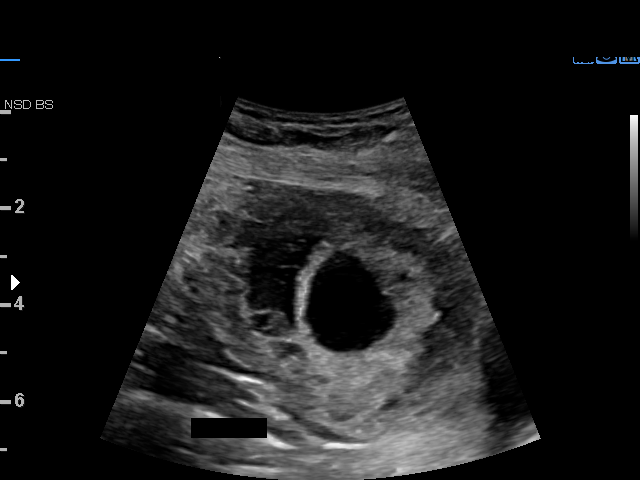
[im 25/27]
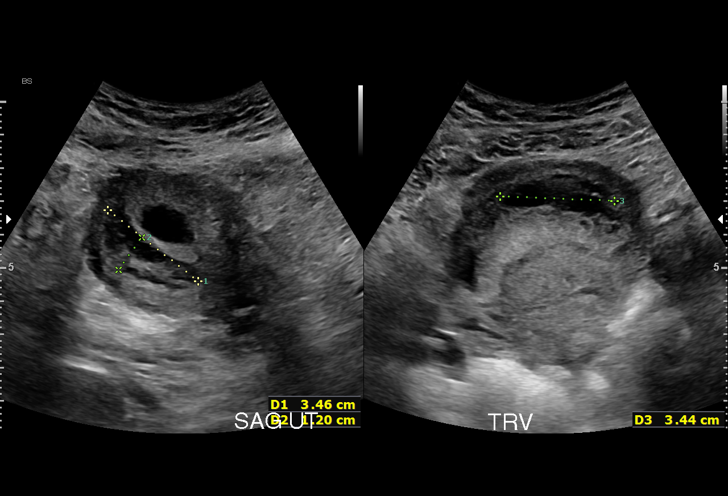
[im 27/27]
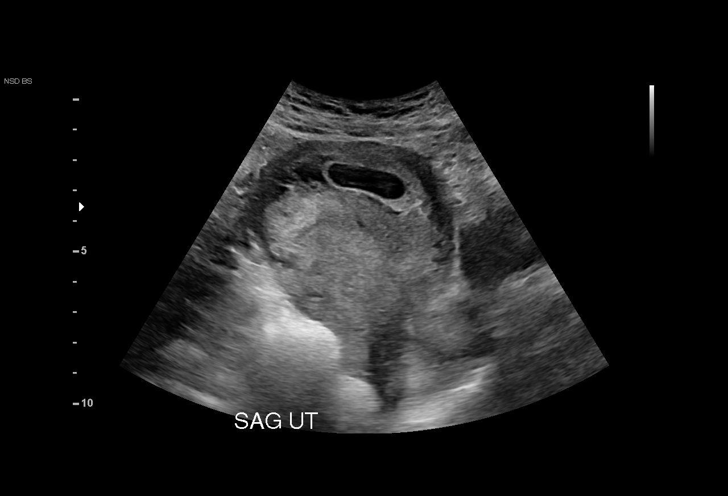

[15 of 27 positions shown; findings below may reference images not displayed]

FINDINGS: Intrauterine gestational sac: Single

Yolk sac:  Visualized.

Embryo:  Visualized.

Cardiac Activity: Visualized.

Heart Rate: 129 bpm

CRL:   7 mm   6 w 4 d                  US EDC: 10/13/2021

Subchorionic hemorrhage:  Moderate subchorionic hemorrhage is seen.

Maternal uterus/adnexae: Small right ovarian corpus luteum is noted.
Left ovary is not directly visualized, however no adnexal mass or
abnormal free fluid identified.
IMPRESSION: Single living IUP with estimated gestational age of 6 weeks 4 days,
and US EDC of 10/13/2021.

Moderate subchorionic hemorrhage.

## 2021-08-31 ENCOUNTER — Encounter: Payer: Self-pay | Admitting: Advanced Practice Midwife

## 2021-09-01 ENCOUNTER — Encounter: Payer: Medicaid Other | Admitting: Advanced Practice Midwife

## 2021-09-09 ENCOUNTER — Encounter (HOSPITAL_COMMUNITY): Payer: Self-pay | Admitting: Family Medicine

## 2021-09-09 ENCOUNTER — Other Ambulatory Visit: Payer: Self-pay

## 2021-09-09 ENCOUNTER — Encounter: Payer: Medicaid Other | Admitting: Obstetrics and Gynecology

## 2021-09-09 ENCOUNTER — Inpatient Hospital Stay (HOSPITAL_COMMUNITY)
Admission: AD | Admit: 2021-09-09 | Discharge: 2021-09-09 | Disposition: A | Discharge status: Home / Self Care | Payer: Medicaid Other | Attending: Family Medicine | Admitting: Family Medicine

## 2021-09-09 DIAGNOSIS — Z20828 Contact with and (suspected) exposure to other viral communicable diseases: Secondary | ICD-10-CM | POA: Diagnosis not present

## 2021-09-09 DIAGNOSIS — J069 Acute upper respiratory infection, unspecified: Secondary | ICD-10-CM | POA: Diagnosis not present

## 2021-09-09 DIAGNOSIS — O99513 Diseases of the respiratory system complicating pregnancy, third trimester: Secondary | ICD-10-CM | POA: Diagnosis not present

## 2021-09-09 DIAGNOSIS — Z3A35 35 weeks gestation of pregnancy: Secondary | ICD-10-CM | POA: Diagnosis not present

## 2021-09-09 DIAGNOSIS — Z20822 Contact with and (suspected) exposure to covid-19: Secondary | ICD-10-CM | POA: Diagnosis not present

## 2021-09-09 DIAGNOSIS — O98513 Other viral diseases complicating pregnancy, third trimester: Secondary | ICD-10-CM | POA: Diagnosis not present

## 2021-09-09 LAB — URINALYSIS, ROUTINE W REFLEX MICROSCOPIC
Bilirubin Urine: NEGATIVE
Glucose, UA: NEGATIVE mg/dL
Hgb urine dipstick: NEGATIVE
Ketones, ur: 40 mg/dL — AB
Nitrite: NEGATIVE
Protein, ur: NEGATIVE mg/dL
Specific Gravity, Urine: 1.015 (ref 1.005–1.030)
pH: 6 (ref 5.0–8.0)

## 2021-09-09 LAB — URINALYSIS, MICROSCOPIC (REFLEX)

## 2021-09-09 LAB — RESP PANEL BY RT-PCR (FLU A&B, COVID) ARPGX2
Influenza A by PCR: NEGATIVE
Influenza B by PCR: NEGATIVE
SARS Coronavirus 2 by RT PCR: NEGATIVE

## 2021-09-09 MED ORDER — ACETAMINOPHEN 500 MG PO TABS
1000.0000 mg | ORAL_TABLET | Freq: Once | ORAL | Status: AC
  Administered 2021-09-09: 1000 mg via ORAL
  Filled 2021-09-09: qty 2

## 2021-09-09 NOTE — MAU Note (Signed)
Julie [name] is a 25 y.o. at [redacted]w[redacted]d here in MAU reporting: headache since yesterday. Tried 500 mg tylenol about 3 hours with relief. No labor complaints. +FM.  Onset of complaint: yesterday  Pain score: 7/10  Vitals:   09/09/21 1441  BP: 114/71  Pulse: (!) 121  Resp: 16  Temp: 99.2 F (37.3 C)  SpO2: 98%     FHT:150  Lab orders placed from triage: UA

## 2021-09-09 NOTE — Discharge Instructions (Signed)

## 2021-09-09 NOTE — MAU Provider Note (Signed)
History     CSN: [id number]  Arrival date and time: 09/09/21 1422   Event Date/Time   First Provider Initiated Contact with Patient 09/09/21 1508      Chief Complaint  Patient presents with   Headache   Julie [name] is a 25 y.o. G4P3003 at 107w2d who presents today with headache and sore throat. She states that she has had a headache for a couple days. She tried tylenol but it did not help. She also has a raspy voice. She reports that her kids have had pink eye and another family member had flu last week. She has not had a flu or covid vaccine. She denies any contractions, VB or LOF. She reports normal fetal movement.   Headache  This is a new problem. The current episode started in the past 7 days. The problem occurs constantly. The problem has been unchanged. The pain is located in the Bilateral region. The pain does not radiate. The pain quality is similar to prior headaches. The pain is at a severity of 5/10. Associated symptoms include a fever. Nothing aggravates the symptoms. She has tried acetaminophen for the symptoms. The treatment provided no relief.   OB History     Gravida  4   Para  3   Term  3   Preterm      AB      Living  3      SAB      IAB      Ectopic      Multiple  0   Live Births  3           Past Medical History:  Diagnosis Date   Anemia    GERD (gastroesophageal reflux disease)    Supervision of other normal pregnancy, antepartum 09/16/2019    Nursing Staff Provider Office Location  [facility] Dating  Ultrasound Language  English Anatomy US  Normal, isolated EIF  Flu Vaccine  Declined Genetic Screen  NIPS:   Low risk girl AFP:   Screen negative  TDaP vaccine   Declined Hgb A1C or  GTT Early  Third trimester    Ref Range & Units 1 d ago  Glucose, Fasting 65 - 91 mg/dL 76   Glucose, 1 hour 65 - 179 mg/dL 546   Glucose, 2 hour 65 - 152 m   Trichomonas infection     Past Surgical History:  Procedure Laterality Date   CHOLECYSTECTOMY  N/A 01/09/2018   Procedure: LAPAROSCOPIC CHOLECYSTECTOMY WITH INTRAOPERATIVE CHOLANGIOGRAM;  Surgeon: Claud Kelp, MD;  Location: [facility];  Service: General;  Laterality: N/A;    Family History  Problem Relation Age of Onset   Heart disease Mother    Diabetes Mother    Pancreatic cancer Father    Cancer Maternal Grandfather     Social History   Tobacco Use   Smoking status: Never   Smokeless tobacco: Never  Vaping Use   Vaping Use: Never used  Substance Use Topics   Alcohol use: No   Drug use: No    Allergies: No Known Allergies  Medications Prior to Admission  Medication Sig Dispense Refill Last Dose   acetaminophen (TYLENOL) 500 MG tablet Take 1,000 mg by mouth every 6 (six) hours as needed for moderate pain.   09/09/2021   ascorbic acid (VITAMIN C) 500 MG tablet Take 1 tablet (500 mg total) by mouth every other day. Take with iron tablet. 30 tablet 3 09/08/2021   ferrous sulfate 325 (65  FE) MG tablet Take 1 tablet (325 mg total) by mouth every other day. 30 tablet 0 Past Month   Prenatal MV-Min-FA-Omega-3 (PRENATAL GUMMIES/DHA & FA) 0.4-32.5 MG CHEW Chew 1 Dose by mouth daily. 30 tablet 1 09/09/2021   Blood Pressure Monitoring (BLOOD PRESSURE MONITOR AUTOMAT) DEVI 1 Device by Does not apply route daily. Automatic blood pressure cuff regular size. To monitor blood pressure regularly at home. ICD-10 code:Z34.90 1 each 0    Elastic Bandages & Supports (COMFORT FIT MATERNITY SUPP SM) MISC 1 Units by Does not apply route daily as needed. (Patient not taking: No sig reported) 1 each 0    hydrOXYzine (VISTARIL) 25 MG capsule Take 1 capsule (25 mg total) by mouth 3 (three) times daily as needed. 30 capsule 0    Misc. Devices (GOJJI WEIGHT SCALE) MISC 1 Device by Does not apply route daily as needed. To weight self daily as needed at home. ICD-10 code: O09.90 1 each 0    predniSONE (DELTASONE) 20 MG tablet Take 3 tablets (60 mg total) by mouth daily with breakfast. 12  tablet 0     Review of Systems  Constitutional:  Positive for fever.  HENT:  Positive for voice change.   Neurological:  Positive for headaches.  All other systems reviewed and are negative. Physical Exam   Blood pressure (!) 116/58, pulse (!) 120, temperature 99.2 F (37.3 C), temperature source Oral, resp. rate 16, height 5\' 5"  (1.651 m), weight 68.4 kg, last menstrual period 01/05/2021, SpO2 98 %, not currently breastfeeding.  Physical Exam Vitals and nursing note reviewed.  Constitutional:      General: She is not in acute distress.    Appearance: She is well-developed.  HENT:     Head: Normocephalic.  Eyes:     Pupils: Pupils are equal, round, and reactive to light.  Cardiovascular:     Rate and Rhythm: Normal rate and regular rhythm.     Heart sounds: Normal heart sounds.  Pulmonary:     Effort: Pulmonary effort is normal. No respiratory distress.     Breath sounds: Normal breath sounds.  Abdominal:     Palpations: Abdomen is soft.     Tenderness: There is no abdominal tenderness.  Genitourinary:    Vagina: No bleeding. Vaginal discharge: mucusy.    Comments: External: no lesion Vagina: small amount of white discharge     Musculoskeletal:        General: Normal range of motion.     Cervical back: Normal range of motion and neck supple.  Skin:    General: Skin is warm and dry.  Neurological:     Mental Status: She is alert and oriented to person, place, and time.  Psychiatric:        Mood and Affect: Mood normal.        Behavior: Behavior normal.   NST:  Baseline: 150 Variability: moderate Accels: 15x15 Decels: none Toco: none Reactive/Appropriate for GA  Results for orders placed or performed during the hospital encounter of 09/09/21 (from the past 24 hour(s))  Resp Panel by RT-PCR (Flu A&B, Covid) Nasopharyngeal Swab     Status: None   Collection Time: 09/09/21  3:23 PM   Specimen: Nasopharyngeal Swab; Nasopharyngeal(NP) swabs in vial transport  medium  Result Value Ref Range   SARS Coronavirus 2 by RT PCR NEGATIVE NEGATIVE   Influenza A by PCR NEGATIVE NEGATIVE   Influenza B by PCR NEGATIVE NEGATIVE  Urinalysis, Routine w reflex microscopic Urine, Clean Catch  Status: Abnormal   Collection Time: 09/09/21  3:24 PM  Result Value Ref Range   Color, Urine YELLOW YELLOW   APPearance CLEAR CLEAR   Specific Gravity, Urine 1.015 1.005 - 1.030   pH 6.0 5.0 - 8.0   Glucose, UA NEGATIVE NEGATIVE mg/dL   Hgb urine dipstick NEGATIVE NEGATIVE   Bilirubin Urine NEGATIVE NEGATIVE   Ketones, ur 40 (A) NEGATIVE mg/dL   Protein, ur NEGATIVE NEGATIVE mg/dL   Nitrite NEGATIVE NEGATIVE   Leukocytes,Ua TRACE (A) NEGATIVE  Urinalysis, Microscopic (reflex)     Status: Abnormal   Collection Time: 09/09/21  3:24 PM  Result Value Ref Range   RBC / HPF 0-5 0 - 5 RBC/hpf   WBC, UA 0-5 0 - 5 WBC/hpf   Bacteria, UA RARE (A) NONE SEEN   Squamous Epithelial / LPF 6-10 0 - 5   Mucus PRESENT      MAU Course  Procedures  MDM Flu and covid negative today Headache is better after tylenol DW patient that she likely has some other viral illness, and list of OTC medications given for patient to treat symptoms at home.   Assessment and Plan   1. Viral URI   2. [redacted] weeks gestation of pregnancy    DC home Comfort measures reviewed  3rd Trimester precautions  PTL precautions  Fetal kick counts RX: none  Return to MAU as needed FU with OB as planned   Follow-up Information     [facility] Follow up.   Specialty: Obstetrics and Gynecology Contact information: [address] [place] [address] [phone]               Thressa Sheller DNP, CNM  09/09/21  5:44 PM

## 2021-09-10 LAB — CULTURE, OB URINE: Culture: <10000 — AB

## 2021-09-16 ENCOUNTER — Telehealth: Payer: Self-pay | Admitting: Obstetrics and Gynecology

## 2021-09-16 ENCOUNTER — Encounter: Payer: Medicaid Other | Admitting: Obstetrics and Gynecology

## 2021-09-16 NOTE — Telephone Encounter (Signed)
Received a call from patient husband that Julie [name] was still sick, and had no voice. Requested to be rescheduled.

## 2021-09-22 ENCOUNTER — Inpatient Hospital Stay (HOSPITAL_COMMUNITY)
Admission: AD | Admit: 2021-09-22 | Discharge: 2021-09-22 | Disposition: A | Discharge status: Home / Self Care | Payer: Medicaid Other | Attending: Obstetrics and Gynecology | Admitting: Obstetrics and Gynecology

## 2021-09-22 ENCOUNTER — Encounter: Payer: Medicaid Other | Admitting: Certified Nurse Midwife

## 2021-09-22 ENCOUNTER — Encounter (HOSPITAL_COMMUNITY): Payer: Self-pay | Admitting: Obstetrics and Gynecology

## 2021-09-22 ENCOUNTER — Telehealth: Payer: Self-pay | Admitting: Certified Nurse Midwife

## 2021-09-22 ENCOUNTER — Other Ambulatory Visit: Payer: Self-pay

## 2021-09-22 DIAGNOSIS — O26893 Other specified pregnancy related conditions, third trimester: Secondary | ICD-10-CM | POA: Insufficient documentation

## 2021-09-22 DIAGNOSIS — A599 Trichomoniasis, unspecified: Secondary | ICD-10-CM

## 2021-09-22 DIAGNOSIS — R109 Unspecified abdominal pain: Secondary | ICD-10-CM | POA: Diagnosis present

## 2021-09-22 DIAGNOSIS — Z3689 Encounter for other specified antenatal screening: Secondary | ICD-10-CM

## 2021-09-22 DIAGNOSIS — M545 Low back pain, unspecified: Secondary | ICD-10-CM | POA: Insufficient documentation

## 2021-09-22 DIAGNOSIS — Z3A37 37 weeks gestation of pregnancy: Secondary | ICD-10-CM | POA: Insufficient documentation

## 2021-09-22 DIAGNOSIS — Z3480 Encounter for supervision of other normal pregnancy, unspecified trimester: Secondary | ICD-10-CM

## 2021-09-22 DIAGNOSIS — O0933 Supervision of pregnancy with insufficient antenatal care, third trimester: Secondary | ICD-10-CM

## 2021-09-22 LAB — URINALYSIS, ROUTINE W REFLEX MICROSCOPIC
Bilirubin Urine: NEGATIVE
Glucose, UA: NEGATIVE mg/dL
Hgb urine dipstick: NEGATIVE
Ketones, ur: 20 mg/dL — AB
Leukocytes,Ua: NEGATIVE
Nitrite: NEGATIVE
Protein, ur: 30 mg/dL — AB
Specific Gravity, Urine: 1.025 (ref 1.005–1.030)
pH: 6 (ref 5.0–8.0)

## 2021-09-22 LAB — WET PREP, GENITAL
Clue Cells Wet Prep HPF POC: NONE SEEN
Sperm: NONE SEEN
Trich, Wet Prep: NONE SEEN
WBC, Wet Prep HPF POC: >=10 — AB (ref <10)
Yeast Wet Prep HPF POC: NONE SEEN

## 2021-09-22 LAB — OB RESULTS CONSOLE GC/CHLAMYDIA: Gonorrhea: NEGATIVE

## 2021-09-22 LAB — OB RESULTS CONSOLE GBS: GBS: NEGATIVE

## 2021-09-22 MED ORDER — ACETAMINOPHEN 500 MG PO TABS
1000.0000 mg | ORAL_TABLET | Freq: Once | ORAL | Status: AC
  Administered 2021-09-22: 16:00:00 1000 mg via ORAL
  Filled 2021-09-22: qty 2

## 2021-09-22 NOTE — Telephone Encounter (Signed)
Attempted to reach patient about rescheduling her OB appointment. Left a voicemail message for her to call the office.

## 2021-09-22 NOTE — MAU Note (Signed)
Julie [name] is a 25 y.o. at [redacted]w[redacted]d here in MAU reporting: back and abdominal pain for the past couple of days. States pain is about every 10 minutes. No bleeding or LOF. +FM  Onset of complaint: ongoing  Pain score: 7/10  Vitals:   09/22/21 1451  BP: 108/66  Pulse: (!) 105  Resp: 16  Temp: 97.7 F (36.5 C)  SpO2: 97%     FHT:150  Lab orders placed from triage: none

## 2021-09-22 NOTE — MAU Provider Note (Signed)
History     CSN: [id number]  Arrival date and time: 09/22/21 1439   Event Date/Time   First Provider Initiated Contact with Patient 09/22/21 1614      Chief Complaint  Patient presents with   Abdominal Pain   Back Pain   HPI Julie [name] is a 25 y.o. G4P3003 at [redacted]w[redacted]d who presents to MAU with chief complaint of abdominal pain, pelvic pressure and low back pain. These are recurrent problems, onset a few days ago. Patient does not think she is in labor. Pain score for her abdominal and pelvic pain is 7/10. She denies aggravating or alleviating factors. She has not taken medication or tried other treatments for this complaint. She denies dysuria, vaginal bleeding, leaking of fluid, decreased fetal movement, fever, falls, or recent illness.   Patient receives care with [facility].  OB History     Gravida  4   Para  3   Term  3   Preterm      AB      Living  3      SAB      IAB      Ectopic      Multiple  0   Live Births  3           Past Medical History:  Diagnosis Date   Anemia    GERD (gastroesophageal reflux disease)    Supervision of other normal pregnancy, antepartum 09/16/2019    Nursing Staff Provider Office Location  [facility] Dating  Ultrasound Language  English Anatomy US  Normal, isolated EIF  Flu Vaccine  Declined Genetic Screen  NIPS:   Low risk girl AFP:   Screen negative  TDaP vaccine   Declined Hgb A1C or  GTT Early  Third trimester    Ref Range & Units 1 d ago  Glucose, Fasting 65 - 91 mg/dL 76   Glucose, 1 hour 65 - 179 mg/dL 810   Glucose, 2 hour 65 - 152 m   Trichomonas infection     Past Surgical History:  Procedure Laterality Date   CHOLECYSTECTOMY N/A 01/09/2018   Procedure: LAPAROSCOPIC CHOLECYSTECTOMY WITH INTRAOPERATIVE CHOLANGIOGRAM;  Surgeon: Claud Kelp, MD;  Location: [facility];  Service: General;  Laterality: N/A;    Family History  Problem Relation Age of Onset   Heart disease Mother     Diabetes Mother    Pancreatic cancer Father    Cancer Maternal Grandfather     Social History   Tobacco Use   Smoking status: Never   Smokeless tobacco: Never  Vaping Use   Vaping Use: Never used  Substance Use Topics   Alcohol use: No   Drug use: No    Allergies: No Known Allergies  Medications Prior to Admission  Medication Sig Dispense Refill Last Dose   Prenatal MV-Min-FA-Omega-3 (PRENATAL GUMMIES/DHA & FA) 0.4-32.5 MG CHEW Chew 1 Dose by mouth daily. 30 tablet 1 Past Week   acetaminophen (TYLENOL) 500 MG tablet Take 1,000 mg by mouth every 6 (six) hours as needed for moderate pain.      ascorbic acid (VITAMIN C) 500 MG tablet Take 1 tablet (500 mg total) by mouth every other day. Take with iron tablet. 30 tablet 3    Blood Pressure Monitoring (BLOOD PRESSURE MONITOR AUTOMAT) DEVI 1 Device by Does not apply route daily. Automatic blood pressure cuff regular size. To monitor blood pressure regularly at home. ICD-10 code:Z34.90 1 each 0    Elastic Bandages & Supports (  COMFORT FIT MATERNITY SUPP SM) MISC 1 Units by Does not apply route daily as needed. (Patient not taking: No sig reported) 1 each 0    ferrous sulfate 325 (65 FE) MG tablet Take 1 tablet (325 mg total) by mouth every other day. 30 tablet 0    predniSONE (DELTASONE) 20 MG tablet Take 3 tablets (60 mg total) by mouth daily with breakfast. 12 tablet 0     Review of Systems  Gastrointestinal:  Positive for abdominal pain.  Genitourinary:  Positive for pelvic pain.  Musculoskeletal:  Positive for back pain.  All other systems reviewed and are negative. Physical Exam   Blood pressure 111/69, pulse 100, temperature 97.7 F (36.5 C), temperature source Oral, resp. rate 16, height 5\' 5"  (1.651 m), weight 68.2 kg, last menstrual period 01/05/2021, SpO2 97 %, not currently breastfeeding.  Physical Exam Vitals and nursing note reviewed. Exam conducted with a chaperone present.  Constitutional:      Appearance: She is  well-developed.  Cardiovascular:     Rate and Rhythm: Normal rate and regular rhythm.     Heart sounds: Normal heart sounds.  Pulmonary:     Effort: Pulmonary effort is normal.     Breath sounds: Normal breath sounds.  Abdominal:     Palpations: Abdomen is soft.     Tenderness: There is no abdominal tenderness. There is no right CVA tenderness or left CVA tenderness.     Comments: Gravid  Skin:    Capillary Refill: Capillary refill takes less than 2 seconds.  Neurological:     Mental Status: She is alert and oriented to person, place, and time.  Psychiatric:        Mood and Affect: Mood normal.        Behavior: Behavior normal.    MAU Course/MDM  Procedures  --Patient not seen in office since 07/21/2021 but has presented to MAU multiple times since then. Given concern for adherence, will collect GBS and GC per typical prenatal visit milestones.  --Reactive tracing: baseline 140, mod var, +, accels, no decels  --Toco: quiet  --Patient declines recheck of cervix prior to discharge.   Orders Placed This Encounter  Procedures   Wet prep, genital   Culture, beta strep (group b only)   Culture, OB Urine   Urinalysis, Routine w reflex microscopic Urine, Clean Catch   Discharge patient   Patient Vitals for the past 24 hrs:  BP Temp Temp src Pulse Resp SpO2 Height Weight  09/22/21 1640 114/66 -- -- 97 -- -- -- --  09/22/21 1512 111/69 -- -- 100 -- -- -- --  09/22/21 1451 108/66 97.7 F (36.5 C) Oral (!) 105 16 97 % -- --  09/22/21 1448 -- -- -- -- -- -- 5\' 5"  (1.651 m) 68.2 kg   Results for orders placed or performed during the hospital encounter of 09/22/21 (from the past 24 hour(s))  Urinalysis, Routine w reflex microscopic Urine, Clean Catch     Status: Abnormal   Collection Time: 09/22/21  3:17 PM  Result Value Ref Range   Color, Urine YELLOW YELLOW   APPearance CLEAR CLEAR   Specific Gravity, Urine 1.025 1.005 - 1.030   pH 6.0 5.0 - 8.0   Glucose, UA NEGATIVE  NEGATIVE mg/dL   Hgb urine dipstick NEGATIVE NEGATIVE   Bilirubin Urine NEGATIVE NEGATIVE   Ketones, ur 20 (A) NEGATIVE mg/dL   Protein, ur 30 (A) NEGATIVE mg/dL   Nitrite NEGATIVE NEGATIVE   Leukocytes,Ua NEGATIVE NEGATIVE  RBC / HPF 0-5 0 - 5 RBC/hpf   WBC, UA 0-5 0 - 5 WBC/hpf   Bacteria, UA RARE (A) NONE SEEN   Squamous Epithelial / LPF 0-5 0 - 5   Mucus PRESENT   Wet prep, genital     Status: Abnormal   Collection Time: 09/22/21  3:25 PM   Specimen: PATH Cytology Cervicovaginal Ancillary Only  Result Value Ref Range   Yeast Wet Prep HPF POC NONE SEEN NONE SEEN   Trich, Wet Prep NONE SEEN NONE SEEN   Clue Cells Wet Prep HPF POC NONE SEEN NONE SEEN   WBC, Wet Prep HPF POC >=10 (A) <10   Sperm NONE SEEN    Meds ordered this encounter  Medications   acetaminophen (TYLENOL) tablet 1,000 mg   Assessment and Plan  --25 y.o. [id number] at [redacted]w[redacted]d  --Reactive tracing --Cervix 1 cm, not laboring --Pain resolved with Tylenol --GBS and GC in work --Discharge home in stable condition with labor precautions  Calvert Cantor, CNM 09/22/2021, 6:33 PM

## 2021-09-23 LAB — GC/CHLAMYDIA PROBE AMP (~~LOC~~) NOT AT ARMC
Chlamydia: NEGATIVE
Comment: NEGATIVE
Comment: NORMAL
Neisseria Gonorrhea: NEGATIVE

## 2021-09-24 LAB — CULTURE, OB URINE: Culture: NO GROWTH

## 2021-09-25 LAB — CULTURE, BETA STREP (GROUP B ONLY)

## 2021-09-26 ENCOUNTER — Inpatient Hospital Stay (HOSPITAL_COMMUNITY)
Admission: AD | Admit: 2021-09-26 | Discharge: 2021-09-26 | Disposition: A | Discharge status: Home / Self Care | Payer: Medicaid Other | Attending: Obstetrics and Gynecology | Admitting: Obstetrics and Gynecology

## 2021-09-26 ENCOUNTER — Other Ambulatory Visit: Payer: Self-pay

## 2021-09-26 DIAGNOSIS — R200 Anesthesia of skin: Secondary | ICD-10-CM | POA: Insufficient documentation

## 2021-09-26 DIAGNOSIS — R109 Unspecified abdominal pain: Secondary | ICD-10-CM | POA: Diagnosis not present

## 2021-09-26 DIAGNOSIS — R21 Rash and other nonspecific skin eruption: Secondary | ICD-10-CM | POA: Diagnosis not present

## 2021-09-26 DIAGNOSIS — O26893 Other specified pregnancy related conditions, third trimester: Secondary | ICD-10-CM | POA: Diagnosis not present

## 2021-09-26 DIAGNOSIS — Z3480 Encounter for supervision of other normal pregnancy, unspecified trimester: Secondary | ICD-10-CM

## 2021-09-26 DIAGNOSIS — Z3A37 37 weeks gestation of pregnancy: Secondary | ICD-10-CM | POA: Diagnosis not present

## 2021-09-26 DIAGNOSIS — R110 Nausea: Secondary | ICD-10-CM | POA: Insufficient documentation

## 2021-09-26 DIAGNOSIS — A599 Trichomoniasis, unspecified: Secondary | ICD-10-CM

## 2021-09-26 LAB — URINALYSIS, ROUTINE W REFLEX MICROSCOPIC
Bilirubin Urine: NEGATIVE
Glucose, UA: NEGATIVE mg/dL
Hgb urine dipstick: NEGATIVE
Ketones, ur: NEGATIVE mg/dL
Nitrite: NEGATIVE
Protein, ur: NEGATIVE mg/dL
Specific Gravity, Urine: 1.015 (ref 1.005–1.030)
pH: 6.5 (ref 5.0–8.0)

## 2021-09-26 LAB — URINALYSIS, MICROSCOPIC (REFLEX)

## 2021-09-26 MED ORDER — ACETAMINOPHEN 500 MG PO TABS
1000.0000 mg | ORAL_TABLET | Freq: Once | ORAL | Status: AC
  Administered 2021-09-26: 21:00:00 1000 mg via ORAL
  Filled 2021-09-26: qty 2

## 2021-09-26 MED ORDER — HYDROCORTISONE 1 % EX CREA
TOPICAL_CREAM | Freq: Once | CUTANEOUS | Status: AC
  Filled 2021-09-26: qty 28

## 2021-09-26 MED ORDER — HYDROCORTISONE 1 % EX CREA: 1.0000 "application " | TOPICAL_CREAM | Freq: Two times a day (BID) | CUTANEOUS | 0 refills | Status: DC

## 2021-09-26 NOTE — MAU Provider Note (Signed)
History    [id number]  Arrival date and time: 09/26/21 1843  Chief Complaint  Patient presents with   Rash   Abdominal Pain   HPI Julie [name] is a 25 y.o. at [redacted]w[redacted]d who presents to MAU with cramping and a rash. Patient states that her symptoms started last night. She has a cramp about once per hour. She does not have pain in between. She denies dysuria, vaginal discharge, or vaginal irritation. She denies back pain. She denies fever, cough, or congestion. She had some mild nausea today but did not vomit. She is eating and drinking. She had similar pain last time she was in the MAU on 12/21. It eventually resolved. She states it is associated with pelvic pressure.   She also has noticed a rash on her left neck, back, and abdomen. It is itchy. She has recently used a different type of soap but does not report any other new foods or activities that could be related.  She has also had some leg numbness from time to time. She has not had any weakness. This is not constant but comes and goes. She has no other concerns at this time.   Vaginal bleeding: No LOF: No Fetal Movement: Yes Contractions: No  O/Positive/-- (06/23 0935)  OB History     Gravida  4   Para  3   Term  3   Preterm      AB      Living  3      SAB      IAB      Ectopic      Multiple  0   Live Births  3           Past Medical History:  Diagnosis Date   Anemia    GERD (gastroesophageal reflux disease)    Supervision of other normal pregnancy, antepartum 09/16/2019    Nursing Staff Provider Office Location  [facility] Dating  Ultrasound Language  English Anatomy US  Normal, isolated EIF  Flu Vaccine  Declined Genetic Screen  NIPS:   Low risk girl AFP:   Screen negative  TDaP vaccine   Declined Hgb A1C or  GTT Early  Third trimester    Ref Range & Units 1 d ago  Glucose, Fasting 65 - 91 mg/dL 76   Glucose, 1 hour 65 - 179 mg/dL 353   Glucose, 2 hour 65 - 152 m   Trichomonas infection      Past Surgical History:  Procedure Laterality Date   CHOLECYSTECTOMY N/A 01/09/2018   Procedure: LAPAROSCOPIC CHOLECYSTECTOMY WITH INTRAOPERATIVE CHOLANGIOGRAM;  Surgeon: Claud Kelp, MD;  Location: [facility];  Service: General;  Laterality: N/A;    Family History  Problem Relation Age of Onset   Heart disease Mother    Diabetes Mother    Pancreatic cancer Father    Cancer Maternal Grandfather     Social History   Socioeconomic History   Marital status: Married    Spouse name: Not on file   Number of children: 3   Years of education: Not on file   Highest education level: High school graduate  Occupational History   Occupation: [employer]   Occupation: Freight forwarder  Tobacco Use   Smoking status: Never   Smokeless tobacco: Never  Vaping Use   Vaping Use: Never used  Substance and Sexual Activity   Alcohol use: No   Drug use: No   Sexual activity: Not Currently    Birth control/protection: None  Other Topics Concern   Not on file  Social History Narrative   Not on file   Social Determinants of Health   Financial Resource Strain: Not on file  Food Insecurity: Not on file  Transportation Needs: Not on file  Physical Activity: Not on file  Stress: Not on file  Social Connections: Not on file  Intimate Partner Violence: Not on file    No Known Allergies  No current facility-administered medications on file prior to encounter.   Current Outpatient Medications on File Prior to Encounter  Medication Sig Dispense Refill   acetaminophen (TYLENOL) 500 MG tablet Take 1,000 mg by mouth every 6 (six) hours as needed for moderate pain.     ascorbic acid (VITAMIN C) 500 MG tablet Take 1 tablet (500 mg total) by mouth every other day. Take with iron tablet. 30 tablet 3   Blood Pressure Monitoring (BLOOD PRESSURE MONITOR AUTOMAT) DEVI 1 Device by Does not apply route daily. Automatic blood pressure cuff regular size. To monitor blood pressure regularly at  home. ICD-10 code:Z34.90 1 each 0   Elastic Bandages & Supports (COMFORT FIT MATERNITY SUPP SM) MISC 1 Units by Does not apply route daily as needed. (Patient not taking: No sig reported) 1 each 0   ferrous sulfate 325 (65 FE) MG tablet Take 1 tablet (325 mg total) by mouth every other day. 30 tablet 0   predniSONE (DELTASONE) 20 MG tablet Take 3 tablets (60 mg total) by mouth daily with breakfast. 12 tablet 0   Prenatal MV-Min-FA-Omega-3 (PRENATAL GUMMIES/DHA & FA) 0.4-32.5 MG CHEW Chew 1 Dose by mouth daily. 30 tablet 1     ROS Pertinent positives and negative per HPI, all others reviewed and negative  Physical Exam   BP (!) 114/44 (BP Location: Right Arm)    Pulse (!) 106    Temp 98.6 F (37 C) (Oral)    Resp 17    Ht 5\' 5"  (1.651 m)    Wt 67.9 kg    LMP 01/05/2021 (Exact Date)    SpO2 100%    BMI 24.91 kg/m   Physical Exam Exam conducted with a chaperone present.  Constitutional:      General: She is not in acute distress. HENT:     Head: Normocephalic and atraumatic.     Mouth/Throat:     Mouth: Mucous membranes are moist.  Eyes:     Extraocular Movements: Extraocular movements intact.  Cardiovascular:     Rate and Rhythm: Normal rate.  Pulmonary:     Effort: Pulmonary effort is normal. No respiratory distress.  Abdominal:     Palpations: Abdomen is soft.     Tenderness: There is no abdominal tenderness. There is no guarding.     Comments: Gravid  Genitourinary:    General: Normal vulva.  Skin:    General: Skin is warm and dry.     Comments: Scattered mildly erythematous papules to left neck, mid-back, and abdomen  Neurological:     General: No focal deficit present.     Mental Status: She is alert and oriented to person, place, and time.     Sensory: No sensory deficit.     Motor: No weakness.  Psychiatric:        Mood and Affect: Mood normal.        Behavior: Behavior normal.   Cervical Exam Dilation: 1.5 Effacement (%): Thick Station: Ballotable Exam by::  Shneur Whittenburg MD  FHT Baseline 135 bpm, moderate variability, 15x15 accels, no decels  Toco: Occasional contractions  Cat: 1  Labs Results for orders placed or performed during the hospital encounter of 09/26/21 (from the past 24 hour(s))  Urinalysis, Routine w reflex microscopic Urine, Clean Catch     Status: Abnormal   Collection Time: 09/26/21  7:26 PM  Result Value Ref Range   Color, Urine YELLOW YELLOW   APPearance CLEAR CLEAR   Specific Gravity, Urine 1.015 1.005 - 1.030   pH 6.5 5.0 - 8.0   Glucose, UA NEGATIVE NEGATIVE mg/dL   Hgb urine dipstick NEGATIVE NEGATIVE   Bilirubin Urine NEGATIVE NEGATIVE   Ketones, ur NEGATIVE NEGATIVE mg/dL   Protein, ur NEGATIVE NEGATIVE mg/dL   Nitrite NEGATIVE NEGATIVE   Leukocytes,Ua SMALL (A) NEGATIVE  Urinalysis, Microscopic (reflex)     Status: Abnormal   Collection Time: 09/26/21  7:26 PM  Result Value Ref Range   RBC / HPF 0-5 0 - 5 RBC/hpf   WBC, UA 6-10 0 - 5 WBC/hpf   Bacteria, UA FEW (A) NONE SEEN   Squamous Epithelial / LPF 11-20 0 - 5   Mucus PRESENT     Imaging No results found.  MAU Course  Procedures Lab Orders         Urinalysis, Routine w reflex microscopic Urine, Clean Catch         Urinalysis, Microscopic (reflex)    Meds ordered this encounter  Medications   acetaminophen (TYLENOL) tablet 1,000 mg   hydrocortisone cream 1 %   hydrocortisone cream 1 %    Sig: Apply 1 application topically 2 (two) times daily. Apply to rash.    Dispense:  45 g    Refill:  0   Imaging Orders  No imaging studies ordered today    MDM Patient presents with multiple complaints Abdominal cramping 1x per hour, lower abdomen No LOF or vaginal bleeding  Abdominal exam benign UA noninfectious appearing SVE 1.5/thick/ballotable, occasional contractions on tocometer Cat 1 tracing, reactive Tylenol given with good result  Discussed etiology is likely due to contractions and intermittent Braxton Hicks contractions  Also having rash,  scattered papules on exam, mildly erythematous  Recently using new soap  No urticaria noted Most similar to contact dermatitis, possibly also bug bites  Hydrocortisone applied with improvement, will continue until improved  Reports intermittent leg numbness, neurologically intact on exam, no back pain or weakness, normal sensation Discussed possible component of sciatica in pregnancy   Assessment and Plan   1. Abdominal pain during pregnancy in third trimester - Likely due to intermittent contractions/Braxton Willa Rough - Not in active labor, SVE stable  - Recommended discharge home, labor precautions reviewed - Recommended hydration and an abdominal binder to help with intermittent pelvic pressure - Messages sent to patient's OB/GYN clinic to help reschedule follow up, last visit missed - All questions and concerns addressed, patient voiced understanding   3. Rash - Contact dermatitis versus bug bites - Prescribed topical hydrocortisone twice daily until resolved - Counseled to avoid scented soaps/detergents - Return for further evaluation if symptoms worsen   3. Leg numbness - Reassured of benign nature given normal neurological exam, no red flag symptoms - Discussed possible component of sciatica in the setting of pregnancy  - Discussed that abdominal binder can help offload pressure on lower back and assist with this - Patient is agreeable to trying, will follow up outpatient  Worthy Rancher, MD

## 2021-09-26 NOTE — MAU Note (Signed)
Been having cramping, started last night- but didn't want to come in.  Doesn't feel like labor. Has been having diarrhea- started yesterday, only gone 2 times today- loose.  Breaking out on the left side of her neck and down her back, noted yesterday- itches. Denies vomiting or fever.

## 2021-09-26 NOTE — Discharge Instructions (Addendum)
Apply hydrocortisone cream to your rash twice daily.   Continue to drink plenty of fluids to stay hydrated (about 8 glasses of water daily).  Try wearing an abdominal binder to help with pelvic pressure.  I have sent a message to the [facility] for you to been seen for your next prenatal visit.

## 2021-09-30 ENCOUNTER — Encounter: Payer: Self-pay | Admitting: Obstetrics and Gynecology

## 2021-09-30 ENCOUNTER — Ambulatory Visit: Payer: Self-pay | Admitting: *Deleted

## 2021-09-30 NOTE — Telephone Encounter (Signed)
°  Chief Complaint: right leg stiff, abdominal pain and back pain , 38 weeks and 4 days pregnant. Symptoms: low abd pain, back pain, right leg feels stiff, N/T decreased movement of baby  Frequency: x 1 week  Pertinent Negatives: Patient denies difficulty breathing, no fever, no vaginal bleeding or spotting Disposition: [x] ED /[] Urgent Care (no appt availability in office) / [] Appointment(In office/virtual)/ []  [facility]/ [] Home Care/ [] Refused Recommended Disposition  Additional Notes:  Sent to LD now due to decrease kick count of baby .         Reason for Disposition  [1] Baby moving less today (e.g., kick count < 5 in 1 hour or < 10 in 2 hours) AND [2] pregnant 23 or more weeks  Answer Assessment - Initial Assessment Questions 1. LOCATION: "Where does it hurt?"      Low abdominal back pian  2. RADIATION: "Does the pain shoot anywhere else?" (e.g., chest, back)     Pain right leg  3. ONSET: "When did the pain begin?" (Minutes, hours or days ago)      X 1 week ago  4. ONSET: "Gradual or sudden onset?"     Sudden and goes away 5. PATTERN: "Does the pain come and go, or has it been constant since it started?"      Comes and goes approx 1 minutes  6. SEVERITY: "How bad is the pain?" "What does it keep you from doing?"  (e.g., Scale 1-10; mild, moderate, or severe)   - MILD (1-3): doesn't interfere with normal activities, abdomen soft and not tender to touch    - MODERATE (4-7): interferes with normal activities or awakens from sleep, abdomen tender to touch    - SEVERE (8-10): excruciating pain, doubled over, unable to do any normal activities     Sharp pains and in vaginal 7. RECURRENT SYMPTOM: "Have you ever had this type of stomach pain before?" If Yes, ask: "When was the last time?" and "What happened that time?"      Yes  8. CAUSE: "What do you think is causing the stomach pain?     Possible labor pains  9. RELIEVING/AGGRAVATING FACTORS: "What makes it better or  worse?" (e.g., antacids, bowel movement, movement)     Sitting up but not now  10. FETAL MOVEMENT: "Has the baby's movement decreased or changed significantly from normal?"       Decrease or changed from  normal  11. OTHER SYMPTOMS: "Do you have any other symptoms?" (e.g., back pain, diarrhea, fever, urination pain, vaginal discharge, vomiting)       Clear vaginal discharge  12. EDD: "What date are you expecting to deliver?"       10/12/21  Protocols used: Pregnancy - Abdominal Pain Greater Than [redacted] Weeks EGA-A-AH

## 2021-10-01 ENCOUNTER — Other Ambulatory Visit: Payer: Self-pay

## 2021-10-01 ENCOUNTER — Encounter (HOSPITAL_COMMUNITY): Payer: Self-pay | Admitting: Obstetrics & Gynecology

## 2021-10-01 ENCOUNTER — Inpatient Hospital Stay (HOSPITAL_COMMUNITY)
Admission: AD | Admit: 2021-10-01 | Discharge: 2021-10-01 | Disposition: A | Discharge status: Home / Self Care | Payer: Medicaid Other | Attending: Obstetrics & Gynecology | Admitting: Obstetrics & Gynecology

## 2021-10-01 DIAGNOSIS — O479 False labor, unspecified: Secondary | ICD-10-CM

## 2021-10-01 DIAGNOSIS — Z3689 Encounter for other specified antenatal screening: Secondary | ICD-10-CM

## 2021-10-01 DIAGNOSIS — O471 False labor at or after 37 completed weeks of gestation: Secondary | ICD-10-CM | POA: Insufficient documentation

## 2021-10-01 DIAGNOSIS — Z3A38 38 weeks gestation of pregnancy: Secondary | ICD-10-CM | POA: Diagnosis not present

## 2021-10-01 DIAGNOSIS — O98313 Other infections with a predominantly sexual mode of transmission complicating pregnancy, third trimester: Secondary | ICD-10-CM | POA: Diagnosis not present

## 2021-10-01 DIAGNOSIS — A599 Trichomoniasis, unspecified: Secondary | ICD-10-CM

## 2021-10-01 DIAGNOSIS — Z3480 Encounter for supervision of other normal pregnancy, unspecified trimester: Secondary | ICD-10-CM

## 2021-10-01 LAB — URINALYSIS, ROUTINE W REFLEX MICROSCOPIC
Bilirubin Urine: NEGATIVE
Glucose, UA: NEGATIVE mg/dL
Hgb urine dipstick: NEGATIVE
Ketones, ur: NEGATIVE mg/dL
Nitrite: NEGATIVE
Protein, ur: NEGATIVE mg/dL
Specific Gravity, Urine: 1.019 (ref 1.005–1.030)
pH: 6 (ref 5.0–8.0)

## 2021-10-01 LAB — POCT FERN TEST: POCT Fern Test: NEGATIVE

## 2021-10-01 NOTE — MAU Note (Addendum)
...  Julie [name] is a 25 y.o. at [redacted]w[redacted]d here in MAU reporting: Sharp intermittent lower abdominal pain that has been occurring for one week now and happens every 10 minutes. Intermittent middle back pain that is burning since yesterday. She is also endorsing white "slimy" discharge that began yesterday. Last IC one month ago. DFM since yesterday evening. No VB or LOF.   Last took extra strength Tylenol two days ago. Last meal last night. She denies an appetite.  Drinks one water bottle per day.  Pain score:  8/10 lower abdominal 7/10 mid back  FHT: 140 doppler Lab orders placed from triage:

## 2021-10-01 NOTE — MAU Provider Note (Signed)
Patient Julie [name] is a 25 y.o. [phone]  At [redacted]w[redacted]d here with complaints of mucousy discharge and lower abdominal pain. She denies vaginal bleeding, LOF. She reports that the baby's movements have been less since yesterday. She feels active movements now.   She denies HTN/GHTN, DM/GDM; she denies any complications in this pregnancy. Today she has eaten a pastry and a bagel.  History     CSN: [id number]  Arrival date and time: 10/01/21 1303   Event Date/Time   First Provider Initiated Contact with Patient 10/01/21 1420      No chief complaint on file.  Abdominal Pain This is a chronic problem. The current episode started in the past 7 days. The onset quality is gradual. The problem occurs intermittently. The problem has been unchanged. The pain is located in the suprapubic region. The pain is at a severity of 7/10. The quality of the pain is cramping. Pain radiation: radiates to her lower back. Associated symptoms include diarrhea and nausea. Pertinent negatives include no arthralgias, belching, constipation or dysuria. Exacerbated by: standing.   OB History     Gravida  4   Para  3   Term  3   Preterm      AB      Living  3      SAB      IAB      Ectopic      Multiple  0   Live Births  3           Past Medical History:  Diagnosis Date   Anemia    GERD (gastroesophageal reflux disease)    Supervision of other normal pregnancy, antepartum 09/16/2019    Nursing Staff Provider Office Location  [facility] Dating  Ultrasound Language  English Anatomy US  Normal, isolated EIF  Flu Vaccine  Declined Genetic Screen  NIPS:   Low risk girl AFP:   Screen negative  TDaP vaccine   Declined Hgb A1C or  GTT Early  Third trimester    Ref Range & Units 1 d ago  Glucose, Fasting 65 - 91 mg/dL 76   Glucose, 1 hour 65 - 179 mg/dL 852   Glucose, 2 hour 65 - 152 m   Trichomonas infection     Past Surgical History:  Procedure Laterality Date   CHOLECYSTECTOMY N/A 01/09/2018    Procedure: LAPAROSCOPIC CHOLECYSTECTOMY WITH INTRAOPERATIVE CHOLANGIOGRAM;  Surgeon: Claud Kelp, MD;  Location: [facility];  Service: General;  Laterality: N/A;    Family History  Problem Relation Age of Onset   Heart disease Mother    Diabetes Mother    Pancreatic cancer Father    Cancer Maternal Grandfather     Social History   Tobacco Use   Smoking status: Never   Smokeless tobacco: Never  Vaping Use   Vaping Use: Never used  Substance Use Topics   Alcohol use: No   Drug use: No    Allergies: No Known Allergies  Medications Prior to Admission  Medication Sig Dispense Refill Last Dose   hydrocortisone cream 1 % Apply 1 application topically 2 (two) times daily. Apply to rash. 45 g 0 09/30/2021   acetaminophen (TYLENOL) 500 MG tablet Take 1,000 mg by mouth every 6 (six) hours as needed for moderate pain.      ascorbic acid (VITAMIN C) 500 MG tablet Take 1 tablet (500 mg total) by mouth every other day. Take with iron tablet. 30 tablet 3    Blood Pressure Monitoring (  BLOOD PRESSURE MONITOR AUTOMAT) DEVI 1 Device by Does not apply route daily. Automatic blood pressure cuff regular size. To monitor blood pressure regularly at home. ICD-10 code:Z34.90 1 each 0    Elastic Bandages & Supports (COMFORT FIT MATERNITY SUPP SM) MISC 1 Units by Does not apply route daily as needed. (Patient not taking: No sig reported) 1 each 0    ferrous sulfate 325 (65 FE) MG tablet Take 1 tablet (325 mg total) by mouth every other day. 30 tablet 0    predniSONE (DELTASONE) 20 MG tablet Take 3 tablets (60 mg total) by mouth daily with breakfast. 12 tablet 0    Prenatal MV-Min-FA-Omega-3 (PRENATAL GUMMIES/DHA & FA) 0.4-32.5 MG CHEW Chew 1 Dose by mouth daily. 30 tablet 1     Review of Systems  Gastrointestinal:  Positive for abdominal pain, diarrhea and nausea. Negative for constipation.  Genitourinary:  Negative for dysuria.  Musculoskeletal:  Negative for arthralgias.  Physical  Exam   Blood pressure (!) 105/59, pulse (!) 109, temperature 97.9 F (36.6 C), temperature source Oral, resp. rate 15, height 5\' 5"  (1.651 m), weight 69.1 kg, last menstrual period 01/05/2021, SpO2 100 %, not currently breastfeeding.  Physical Exam Constitutional:      Appearance: Normal appearance.  Pulmonary:     Effort: Pulmonary effort is normal.  Abdominal:     General: Abdomen is flat.  Musculoskeletal:        General: Normal range of motion.  Skin:    General: Skin is warm.  Neurological:     Mental Status: She is alert.  Psychiatric:        Mood and Affect: Mood normal.   MAU Course  Procedures  MDM -NST: 130 bpm, mod var, present acel, no decels, occasional contractions -patient feels strong active fetal movements -patient declines wet prep/GC -patient had negative fern slide here; speculum exam not performed as patient denies LOF -patient declines repeat cervical exam; first check patient was 1.5 cm  Assessment and Plan   1. Braxton Hick's contraction   2. Supervision of other normal pregnancy, antepartum   3. Trichomoniasis   4. NST (non-stress test) reactive    -patient stable for discharge; discussed that pelvic pressure and increased vaginal discharge are common in pregnancy especially at the end -reviewed signs of labor and when to return to MAU -keep upcoming appt at [facility] on 10-06-2021    -  12-04-2021 Frankye Schwegel 10/01/2021, 2:50 PM

## 2021-10-03 NOTE — L&D Delivery Note (Signed)
Delivery Note At 0503 a viable female infant was delivered by RN via SVD, presentation: OA. APGAR: 9, 9; weight pending.   Placenta status: spontaneously delivered intact with gentle cord traction. Fundus firm with massage and Pitocin.   Anesthesia: epidural Lacerations: none Est. Blood Loss (mL): 100 Placenta to LD Complications none Cord ph n/a   Mom to postpartum. Baby to Couplet care / Skin to Skin.    Donette Larry, CNM 10/04/2021 5:15 AM

## 2021-10-04 ENCOUNTER — Inpatient Hospital Stay (HOSPITAL_COMMUNITY): Payer: Medicaid Other | Admitting: Anesthesiology

## 2021-10-04 ENCOUNTER — Other Ambulatory Visit: Payer: Self-pay

## 2021-10-04 ENCOUNTER — Inpatient Hospital Stay (HOSPITAL_COMMUNITY)
Admission: AD | Admit: 2021-10-04 | Discharge: 2021-10-05 | DRG: 807 | Disposition: A | Discharge status: Home / Self Care | Payer: Medicaid Other | Attending: Obstetrics and Gynecology | Admitting: Obstetrics and Gynecology

## 2021-10-04 ENCOUNTER — Encounter (HOSPITAL_COMMUNITY): Payer: Self-pay | Admitting: Obstetrics and Gynecology

## 2021-10-04 DIAGNOSIS — O98313 Other infections with a predominantly sexual mode of transmission complicating pregnancy, third trimester: Secondary | ICD-10-CM | POA: Diagnosis not present

## 2021-10-04 DIAGNOSIS — Z3A38 38 weeks gestation of pregnancy: Secondary | ICD-10-CM

## 2021-10-04 DIAGNOSIS — Z20822 Contact with and (suspected) exposure to covid-19: Secondary | ICD-10-CM | POA: Diagnosis present

## 2021-10-04 DIAGNOSIS — O26893 Other specified pregnancy related conditions, third trimester: Secondary | ICD-10-CM | POA: Diagnosis present

## 2021-10-04 LAB — RESP PANEL BY RT-PCR (FLU A&B, COVID) ARPGX2
Influenza A by PCR: NEGATIVE
Influenza B by PCR: NEGATIVE
SARS Coronavirus 2 by RT PCR: NEGATIVE

## 2021-10-04 LAB — CBC
HCT: 39.1 % (ref 36.0–46.0)
Hemoglobin: 13.1 g/dL (ref 12.0–15.0)
MCH: 29.2 pg (ref 26.0–34.0)
MCHC: 33.5 g/dL (ref 30.0–36.0)
MCV: 87.1 fL (ref 80.0–100.0)
Platelets: 208 10*3/uL (ref 150–400)
RBC: 4.49 MIL/uL (ref 3.87–5.11)
RDW: 16.3 % — ABNORMAL HIGH (ref 11.5–15.5)
WBC: 6.3 10*3/uL (ref 4.0–10.5)
nRBC: 0 % (ref 0.0–0.2)

## 2021-10-04 LAB — RPR: RPR Ser Ql: NONREACTIVE

## 2021-10-04 MED ORDER — ACETAMINOPHEN 325 MG PO TABS: 650.0000 mg | ORAL_TABLET | ORAL | Status: DC | PRN

## 2021-10-04 MED ORDER — ONDANSETRON HCL 4 MG/2ML IJ SOLN: 4.0000 mg | INTRAMUSCULAR | Status: DC | PRN

## 2021-10-04 MED ORDER — BENZOCAINE-MENTHOL 20-0.5 % EX AERO: 1.0000 "application " | INHALATION_SPRAY | CUTANEOUS | Status: DC | PRN

## 2021-10-04 MED ORDER — FENTANYL-BUPIVACAINE-NACL 0.5-0.125-0.9 MG/250ML-% EP SOLN
12.0000 mL/h | EPIDURAL | Status: DC | PRN
  Administered 2021-10-04: 12 mL/h via EPIDURAL
  Filled 2021-10-04: qty 250

## 2021-10-04 MED ORDER — LIDOCAINE HCL (PF) 1 % IJ SOLN: 30.0000 mL | INTRAMUSCULAR | Status: DC | PRN

## 2021-10-04 MED ORDER — LACTATED RINGERS IV SOLN: 500.0000 mL | INTRAVENOUS | Status: DC | PRN

## 2021-10-04 MED ORDER — PRENATAL MULTIVITAMIN CH
1.0000 | ORAL_TABLET | Freq: Every day | ORAL | Status: DC
  Administered 2021-10-04 – 2021-10-05 (×2): 1 via ORAL
  Filled 2021-10-04 (×2): qty 1

## 2021-10-04 MED ORDER — COCONUT OIL OIL: 1.0000 "application " | TOPICAL_OIL | Status: DC | PRN

## 2021-10-04 MED ORDER — DIBUCAINE (PERIANAL) 1 % EX OINT: 1.0000 "application " | TOPICAL_OINTMENT | CUTANEOUS | Status: DC | PRN

## 2021-10-04 MED ORDER — OXYTOCIN BOLUS FROM INFUSION
333.0000 mL | Freq: Once | INTRAVENOUS | Status: AC
  Administered 2021-10-04: 333 mL via INTRAVENOUS

## 2021-10-04 MED ORDER — SOD CITRATE-CITRIC ACID 500-334 MG/5ML PO SOLN: 30.0000 mL | ORAL | Status: DC | PRN

## 2021-10-04 MED ORDER — LACTATED RINGERS IV SOLN
500.0000 mL | Freq: Once | INTRAVENOUS | Status: AC
  Administered 2021-10-04: 500 mL via INTRAVENOUS

## 2021-10-04 MED ORDER — PHENYLEPHRINE 40 MCG/ML (10ML) SYRINGE FOR IV PUSH (FOR BLOOD PRESSURE SUPPORT): 80.0000 ug | PREFILLED_SYRINGE | INTRAVENOUS | Status: DC | PRN

## 2021-10-04 MED ORDER — EPHEDRINE 5 MG/ML INJ: 10.0000 mg | INTRAVENOUS | Status: DC | PRN

## 2021-10-04 MED ORDER — MEDROXYPROGESTERONE ACETATE 150 MG/ML IM SUSP
150.0000 mg | Freq: Once | INTRAMUSCULAR | Status: AC
  Administered 2021-10-05: 150 mg via INTRAMUSCULAR
  Filled 2021-10-04: qty 1

## 2021-10-04 MED ORDER — OXYCODONE-ACETAMINOPHEN 5-325 MG PO TABS: 1.0000 | ORAL_TABLET | ORAL | Status: DC | PRN

## 2021-10-04 MED ORDER — LACTATED RINGERS IV SOLN
INTRAVENOUS | Status: DC
  Administered 2021-10-04: 950 mL via INTRAVENOUS

## 2021-10-04 MED ORDER — IBUPROFEN 600 MG PO TABS
600.0000 mg | ORAL_TABLET | Freq: Four times a day (QID) | ORAL | Status: DC
  Administered 2021-10-04 – 2021-10-05 (×5): 600 mg via ORAL
  Filled 2021-10-04 (×5): qty 1

## 2021-10-04 MED ORDER — DIPHENHYDRAMINE HCL 50 MG/ML IJ SOLN: 12.5000 mg | INTRAMUSCULAR | Status: DC | PRN

## 2021-10-04 MED ORDER — LIDOCAINE-EPINEPHRINE (PF) 1.5 %-1:200000 IJ SOLN
INTRAMUSCULAR | Status: DC | PRN
Start: 2021-10-04 — End: 2021-10-04
  Administered 2021-10-04: 2 mL via PERINEURAL
  Administered 2021-10-04: 3 mL via PERINEURAL

## 2021-10-04 MED ORDER — SENNOSIDES-DOCUSATE SODIUM 8.6-50 MG PO TABS
2.0000 | ORAL_TABLET | ORAL | Status: DC
  Administered 2021-10-04 – 2021-10-05 (×2): 2 via ORAL
  Filled 2021-10-04 (×2): qty 2

## 2021-10-04 MED ORDER — ONDANSETRON HCL 4 MG/2ML IJ SOLN: 4.0000 mg | Freq: Four times a day (QID) | INTRAMUSCULAR | Status: DC | PRN

## 2021-10-04 MED ORDER — OXYTOCIN-SODIUM CHLORIDE 30-0.9 UT/500ML-% IV SOLN
2.5000 [IU]/h | INTRAVENOUS | Status: DC
  Administered 2021-10-04: 2.5 [IU]/h via INTRAVENOUS
  Filled 2021-10-04: qty 500

## 2021-10-04 MED ORDER — SIMETHICONE 80 MG PO CHEW: 80.0000 mg | CHEWABLE_TABLET | ORAL | Status: DC | PRN

## 2021-10-04 MED ORDER — MEASLES, MUMPS & RUBELLA VAC IJ SOLR: 0.5000 mL | Freq: Once | INTRAMUSCULAR | Status: DC

## 2021-10-04 MED ORDER — ONDANSETRON HCL 4 MG PO TABS: 4.0000 mg | ORAL_TABLET | ORAL | Status: DC | PRN

## 2021-10-04 MED ORDER — WITCH HAZEL-GLYCERIN EX PADS: 1.0000 "application " | MEDICATED_PAD | CUTANEOUS | Status: DC | PRN

## 2021-10-04 MED ORDER — OXYCODONE-ACETAMINOPHEN 5-325 MG PO TABS: 2.0000 | ORAL_TABLET | ORAL | Status: DC | PRN

## 2021-10-04 MED ORDER — TETANUS-DIPHTH-ACELL PERTUSSIS 5-2.5-18.5 LF-MCG/0.5 IM SUSY: 0.5000 mL | PREFILLED_SYRINGE | Freq: Once | INTRAMUSCULAR | Status: DC

## 2021-10-04 MED ORDER — DIPHENHYDRAMINE HCL 25 MG PO CAPS: 25.0000 mg | ORAL_CAPSULE | Freq: Four times a day (QID) | ORAL | Status: DC | PRN

## 2021-10-04 NOTE — Discharge Summary (Signed)
Postpartum Discharge Summary     Patient Name: Julie [name] DOB: [birth year] MRN: [id number]  Date of admission: 10/04/2021 Delivery date:10/04/2021  Delivering provider: [name]  Date of discharge: 10/05/2021  Admitting diagnosis: Indication for care in labor and delivery, antepartum [O75.9] Intrauterine pregnancy: [redacted]w[redacted]d    Secondary diagnosis:  Principal Problem:   Indication for care in labor and delivery, antepartum Active Problems:   SVD (spontaneous vaginal delivery)  Additional problems: trichomonas, short pregnancy interval, anti-lewis antibody, no prenatal care after 28 wks   Discharge diagnosis: Term Pregnancy Delivered                                              Post partum procedures: None Augmentation: N/A Complications: None  Hospital course: Onset of Labor With Vaginal Delivery      26y.o. yo [id number] 393w6das admitted in Active Labor on 10/04/2021. Patient had an uncomplicated labor course as follows:  Membrane Rupture Time/Date: 5:02 AM ,10/04/2021   Delivery Method:Vaginal, Spontaneous  Episiotomy: None  Lacerations:  None  Patient had an uncomplicated postpartum course.  She is ambulating, tolerating a regular diet, passing flatus, and urinating well. Patient is discharged home in stable condition on 10/05/21.  Newborn Data: Birth date:10/04/2021  Birth time:5:03 AM  Gender:Female  Living status:Living  Apgars:9 ,9  Weight:2900 g   Magnesium Sulfate received: No BMZ received: No Rhophylac:N/A MMR:N/A T-DaP: no Flu: No Transfusion:No  Physical exam  Vitals:   10/04/21 1301 10/04/21 1621 10/04/21 2105 10/05/21 0617  BP: 114/71 110/73 118/75 120/64  Pulse: 90 85 93 90  Resp: 18 16 16 16   Temp: 98.5 F (36.9 C) 97.8 F (36.6 C) 98.4 F (36.9 C) 98 F (36.7 C)  TempSrc: Oral Axillary Oral Oral  SpO2: 100% 100% 100% 100%   General: alert and cooperative Lochia: appropriate Uterine Fundus: firm Incision: N/A DVT Evaluation: No  evidence of DVT seen on physical exam. No cords or calf tenderness. No significant calf/ankle edema. Labs: Lab Results  Component Value Date   WBC 6.3 10/04/2021   HGB 13.1 10/04/2021   HCT 39.1 10/04/2021   MCV 87.1 10/04/2021   PLT 208 10/04/2021   CMP Latest Ref Rng & Units 10/02/2019  Glucose 65 - 99 mg/dL 78  BUN 6 - 20 mg/dL -  Creatinine 0.44 - 1.00 mg/dL -  Sodium 135 - 145 mmol/L -  Potassium 3.5 - 5.1 mmol/L -  Chloride 101 - 111 mmol/L -  CO2 22 - 32 mmol/L -  Calcium 8.9 - 10.3 mg/dL -  Total Protein 6.5 - 8.1 g/dL -  Total Bilirubin 0.3 - 1.2 mg/dL -  Alkaline Phos 38 - 126 U/L -  AST 15 - 41 U/L -  ALT 14 - 54 U/L -   Edinburgh Score: Edinburgh Postnatal Depression Scale Screening Tool 10/04/2021  I have been able to laugh and see the funny side of things. 0  I have looked forward with enjoyment to things. 0  I have blamed myself unnecessarily when things went wrong. 0  I have been anxious or worried for no good reason. 0  I have felt scared or panicky for no good reason. 0  Things have been getting on top of me. 0  I have been so unhappy that I have had difficulty sleeping. 0  I have felt  sad or miserable. 0  I have been so unhappy that I have been crying. 0  The thought of harming myself has occurred to me. 0  Edinburgh Postnatal Depression Scale Total 0     After visit meds:  Allergies as of 10/05/2021   No Known Allergies      Medication List     STOP taking these medications    Comfort Fit Maternity Supp Sm Misc   ferrous sulfate 325 (65 FE) MG tablet       TAKE these medications    acetaminophen 500 MG tablet Commonly known as: TYLENOL Take 1,000 mg by mouth every 6 (six) hours as needed for moderate pain.   ascorbic acid 500 MG tablet Commonly known as: VITAMIN C Take 1 tablet (500 mg total) by mouth every other day. Take with iron tablet.   Blood Pressure Monitor Automat Devi 1 Device by Does not apply route daily. Automatic  blood pressure cuff regular size. To monitor blood pressure regularly at home. ICD-10 code:Z34.90   hydrocortisone cream 1 % Apply 1 application topically 2 (two) times daily. Apply to rash.   ibuprofen 600 MG tablet Commonly known as: ADVIL Take 1 tablet (600 mg total) by mouth every 6 (six) hours.   predniSONE 20 MG tablet Commonly known as: DELTASONE Take 3 tablets (60 mg total) by mouth daily with breakfast.   Prenatal Gummies/DHA & FA 0.4-32.5 MG Chew Chew 1 Dose by mouth daily.         Discharge home in stable condition Infant Feeding: Bottle Infant Disposition:home with mother Discharge instruction: per After Visit Summary and Postpartum booklet. Activity: Advance as tolerated. Pelvic rest for 6 weeks.  Diet: routine diet Future Appointments: Future Appointments  Date Time Provider Tea  10/06/2021  3:35 PM Renee Harder, CNM [facility] None   Follow up Visit:  Follow-up Information     [facility].   Specialty: Obstetrics and Gynecology Why: They will call you with an appointment Contact information: [place] [phone]                 Please schedule this patient for a In person postpartum visit in 6 weeks with the following provider: Any provider. Additional Postpartum F/U: n/a   Low risk pregnancy complicated by:  n/a Delivery mode:  Vaginal, Spontaneous  Anticipated Birth Control:  Depo, prior to discharge   10/05/2021 Kerry Hough, PA-C

## 2021-10-04 NOTE — Anesthesia Procedure Notes (Signed)
Epidural Patient location during procedure: OB Start time: 10/04/2021 3:24 AM End time: 10/04/2021 3:43 AM  Staffing Anesthesiologist: Nolon Nations, MD Performed: anesthesiologist   Preanesthetic Checklist Completed: patient identified, IV checked, risks and benefits discussed, monitors and equipment checked, pre-op evaluation and timeout performed  Epidural Patient position: sitting Prep: DuraPrep and site prepped and draped Patient monitoring: heart rate, continuous pulse ox and blood pressure Approach: midline Location: L3-L4 Injection technique: LOR air and LOR saline  Needle:  Needle type: Tuohy  Needle gauge: 17 G Needle length: 9 cm Needle insertion depth: 6 cm Catheter type: closed end flexible Catheter size: 19 Gauge Catheter at skin depth: 12 cm Test dose: negative  Assessment Sensory level: T8 Events: blood not aspirated, injection not painful, no injection resistance, no paresthesia and negative IV test  Additional Notes Reason for block:procedure for pain

## 2021-10-04 NOTE — MAU Note (Signed)
Reports contractions since yesterday morning that worsened around 1:30am. Denies vaginal bleeding or leaking of fluid. +FM. Denies complications in pregnancy.

## 2021-10-04 NOTE — H&P (Addendum)
OBSTETRIC ADMISSION HISTORY AND PHYSICAL  Julie [name] is a 26 y.o. female [phone] with IUP at [redacted]w[redacted]d presenting for labor. She reports +FMs. No LOF, VB, blurry vision, headaches, peripheral edema, or RUQ pain. She plans on bottlefeeding. She requests Depo for birth control.  Dating: By Doran Heater [place] --->  Estimated Date of Delivery: 10/12/21  Sono:    @[redacted]w[redacted]d , normal anatomy, ceph presentation, 739g, 38%ile, EFW 1'10   Prenatal History/Complications: - trichomonas - anti lewis antibody - short pregnancy interval - anemia - no prenatal care after 28 wks  Past Medical History: Past Medical History:  Diagnosis Date   Anemia    GERD (gastroesophageal reflux disease)    Supervision of other normal pregnancy, antepartum 09/16/2019    Nursing Staff Provider Office Location  [facility] Dating  Ultrasound Language  English Anatomy US  Normal, isolated EIF  Flu Vaccine  Declined Genetic Screen  NIPS:   Low risk girl AFP:   Screen negative  TDaP vaccine   Declined Hgb A1C or  GTT Early  Third trimester    Ref Range & Units 1 d ago  Glucose, Fasting 65 - 91 mg/dL 76   Glucose, 1 hour 65 - 179 mg/dL 104   Glucose, 2 hour 65 - 152 m   Trichomonas infection     Past Surgical History: Past Surgical History:  Procedure Laterality Date   CHOLECYSTECTOMY N/A 01/09/2018   Procedure: LAPAROSCOPIC CHOLECYSTECTOMY WITH INTRAOPERATIVE CHOLANGIOGRAM;  Surgeon: Fanny Skates, MD;  Location: [place];  Service: General;  Laterality: N/A;    Obstetrical History: OB History     Gravida  4   Para  3   Term  3   Preterm      AB      Living  3      SAB      IAB      Ectopic      Multiple  0   Live Births  3           Social History: Social History   Socioeconomic History   Marital status: Married    Spouse name: Not on file   Number of children: 3   Years of education: Not on file   Highest education level: High school graduate  Occupational History    Occupation: [employer]   Occupation: Agricultural consultant  Tobacco Use   Smoking status: Never   Smokeless tobacco: Never  Vaping Use   Vaping Use: Never used  Substance and Sexual Activity   Alcohol use: No   Drug use: No   Sexual activity: Not Currently    Birth control/protection: None  Other Topics Concern   Not on file  Social History Narrative   Not on file   Social Determinants of Health   Financial Resource Strain: Not on file  Food Insecurity: Not on file  Transportation Needs: Not on file  Physical Activity: Not on file  Stress: Not on file  Social Connections: Not on file    Family History: Family History  Problem Relation Age of Onset   Heart disease Mother    Diabetes Mother    Pancreatic cancer Father    Cancer Maternal Grandfather     Allergies: No Known Allergies  Medications Prior to Admission  Medication Sig Dispense Refill Last Dose   acetaminophen (TYLENOL) 500 MG tablet Take 1,000 mg by mouth every 6 (six) hours as needed for moderate pain.      ascorbic acid (VITAMIN C) 500  MG tablet Take 1 tablet (500 mg total) by mouth every other day. Take with iron tablet. 30 tablet 3    Blood Pressure Monitoring (BLOOD PRESSURE MONITOR AUTOMAT) DEVI 1 Device by Does not apply route daily. Automatic blood pressure cuff regular size. To monitor blood pressure regularly at home. ICD-10 code:Z34.90 1 each 0    Elastic Bandages & Supports (COMFORT FIT MATERNITY SUPP SM) MISC 1 Units by Does not apply route daily as needed. (Patient not taking: No sig reported) 1 each 0    ferrous sulfate 325 (65 FE) MG tablet Take 1 tablet (325 mg total) by mouth every other day. 30 tablet 0    hydrocortisone cream 1 % Apply 1 application topically 2 (two) times daily. Apply to rash. 45 g 0    predniSONE (DELTASONE) 20 MG tablet Take 3 tablets (60 mg total) by mouth daily with breakfast. 12 tablet 0    Prenatal MV-Min-FA-Omega-3 (PRENATAL GUMMIES/DHA & FA) 0.4-32.5 MG CHEW Chew 1 Dose by  mouth daily. 30 tablet 1      Review of Systems:  All systems reviewed and negative except as stated in HPI  PE: Blood pressure 121/77, pulse (!) 116, temperature 97.6 F (36.4 C), temperature source Oral, resp. rate 16, last menstrual period 01/05/2021, not currently breastfeeding. General appearance: alert, cooperative, and no distress Lungs: regular rate and effort Heart: regular rate  Abdomen: soft, non-tender Extremities: Homans sign is negative, no sign of DVT Presentation: cephalic EFM: 0000000 bpm, mod variability, + accels, no decels Toco: 2-3 Dilation: 5.5 Exam by:: D. Ansah-mensah, RN   Prenatal labs: ABO, Rh: O/Positive/-- (06/23 0935) Antibody: Positive, See Final Results (06/23 0935) Rubella: 3.40 (06/23 0935) RPR: Non Reactive (10/14 0837)  HBsAg: Negative (06/23 0935)  HIV: Non Reactive (10/14 0837)  GBS:  neg 2 hr GTT nml  Prenatal Transfer Tool  Maternal Diabetes: No Genetic Screening: Normal Maternal Ultrasounds/Referrals: Normal Fetal Ultrasounds or other Referrals:  None Maternal Substance Abuse:  No Significant Maternal Medications:  None Significant Maternal Lab Results: Group B Strep negative  No results found for this or any previous visit (from the past 24 hour(s)).  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 G4P3003 at [redacted]w[redacted]d here for labor  1. Labor: active 2. FWB: Cat I 3. Pain: epidural 4. GBS: neg   Plan: Admit to LD Anticipate SVD  Julie [name], CNM  10/04/2021, 2:44 AM

## 2021-10-04 NOTE — Anesthesia Preprocedure Evaluation (Signed)
Anesthesia Evaluation  Patient identified by MRN, date of birth, ID band Patient awake    Reviewed: Allergy & Precautions, H&P , Patient's Chart, lab work & pertinent test results  Airway Mallampati: I  TM Distance: >3 FB Neck ROM: full    Dental  (+) Teeth Intact   Pulmonary neg pulmonary ROS,    Pulmonary exam normal breath sounds clear to auscultation       Cardiovascular negative cardio ROS Normal cardiovascular exam Rhythm:regular Rate:Normal     Neuro/Psych negative neurological ROS  negative psych ROS   GI/Hepatic negative GI ROS, Neg liver ROS, GERD  ,  Endo/Other  negative endocrine ROS  Renal/GU negative Renal ROS  negative genitourinary   Musculoskeletal negative musculoskeletal ROS (+)   Abdominal   Peds  Hematology negative hematology ROS (+) Blood dyscrasia, anemia ,   Anesthesia Other Findings   Reproductive/Obstetrics (+) Pregnancy                             Anesthesia Physical  Anesthesia Plan  ASA: 2  Anesthesia Plan: Epidural   Post-op Pain Management:    Induction:   PONV Risk Score and Plan:   Airway Management Planned:   Additional Equipment:   Intra-op Plan:   Post-operative Plan:   Informed Consent: I have reviewed the patients History and Physical, chart, labs and discussed the procedure including the risks, benefits and alternatives for the proposed anesthesia with the patient or authorized representative who has indicated his/her understanding and acceptance.       Plan Discussed with:   Anesthesia Plan Comments:         Anesthesia Quick Evaluation

## 2021-10-04 NOTE — Anesthesia Postprocedure Evaluation (Signed)
Anesthesia Post Note  Patient: Julie [name]  Procedure(s) Performed: AN AD HOC LABOR EPIDURAL     Patient location during evaluation: Mother Baby Anesthesia Type: Epidural Level of consciousness: awake and alert Pain management: pain level controlled Vital Signs Assessment: post-procedure vital signs reviewed and stable Respiratory status: spontaneous breathing, nonlabored ventilation and respiratory function stable Cardiovascular status: stable Postop Assessment: no headache, no backache and epidural receding Anesthetic complications: no   No notable events documented.  Last Vitals:  Vitals:   10/04/21 1301 10/04/21 1621  BP: 114/71 110/73  Pulse: 90 85  Resp: 18 16  Temp: 36.9 C 36.6 C  SpO2: 100% 100%    Last Pain:  Vitals:   10/04/21 1726  TempSrc:   PainSc: 0-No pain   Pain Goal:                   [name]

## 2021-10-05 MED ORDER — IBUPROFEN 600 MG PO TABS: 600.0000 mg | ORAL_TABLET | Freq: Four times a day (QID) | ORAL | 0 refills | Status: DC

## 2021-10-05 NOTE — Social Work (Signed)
CSW received consult due to disagreement between [name] and FOB regarding infant's last name. CSW met with [name] to offer support and complete assessment.     CSW met with [name] at bedside and introduced CSW role. CSW observed [name] up in the room attending the infant, while the pediatrician was exiting the room.  [name] presented calm and welcoming of [place] visit. CSW explained the reason for the visit. [name] shared that she and FOB had a disagreement about the infant's last name. [name] shared she wanted the infant to have maternal grandfather's last name and FOB was upset about this. [name] reported she was informed by Birth Registry that because she is married the infant will receive FOB last name. [name] reported that FOB was on his way upstairs to complete the birth certificate. CSW assessed [name] for safety. [name] denied any physical altercations with FOB. [name] shared she feels safe, has no safety concerns regarding the safety of her children.   CSW inquired if [name] needed any resources. [name] reported that she could use resources related to childcare. CSW provided [name] with  [facility] childcare resources and gave instruction on to follow up. CSW assessed [name] for additional needs. [name] reported her other children receive WIC services however, she needed the contact information to notify [facility] about the birth. CSW provided [facility] information. [name] informed CSW that she has essential items for the infant including a bassinet where the infant will sleep but could use assistance with diapers and wipes. CSW informed [name] about [facility]. [name] gave CSW permission to make a referral.    CSW identifies no further need for intervention and no barriers to discharge at this time.   Julie [name], MSW, LCSW Women's and [place] Worker  [phone] 10/05/2021  10:25 AM

## 2021-10-06 LAB — BPAM RBC
Blood Product Expiration Date: 202301192359
Blood Product Expiration Date: 202301212359
ISSUE DATE / TIME: 202212290842
Unit Type and Rh: 5100
Unit Type and Rh: 5100

## 2021-10-06 LAB — TYPE AND SCREEN
ABO/RH(D): O POS
Antibody Screen: POSITIVE
Unit division: 0
Unit division: 0

## 2021-10-18 ENCOUNTER — Encounter: Payer: Self-pay | Admitting: Obstetrics and Gynecology

## 2021-10-19 ENCOUNTER — Telehealth (HOSPITAL_COMMUNITY): Payer: Self-pay | Admitting: *Deleted

## 2021-10-19 NOTE — Telephone Encounter (Signed)
Attempted Hospital Discharge Follow-Up Call.  Left voice mail requesting that patient return RN's phone call.  

## 2021-11-09 ENCOUNTER — Telehealth: Payer: Self-pay

## 2021-11-09 NOTE — Telephone Encounter (Signed)
Patient requested appointment be virtual. She was not able to come in the office.

## 2021-11-10 ENCOUNTER — Telehealth (INDEPENDENT_AMBULATORY_CARE_PROVIDER_SITE_OTHER): Payer: Medicaid Other

## 2021-11-10 ENCOUNTER — Other Ambulatory Visit: Payer: Self-pay

## 2021-11-10 NOTE — Patient Instructions (Signed)
°  Center for [facility] for [facility] at [facility]  [address] [place], [place] [address]  [phone]  Center for [facility] at [facility]  [address], [place], [place] [address]  [phone]  Center for [facility] at [facility] [address], [place], [place] [address]  [phone]  Center for [facility] Healthcare at [facility]  [address] [place], [place] [address]  [phone]  Center for [facility] at [place] [facility]  [address] (First floor), [place], [place] [address]  [phone]  Center for [employer] at [facility] for [employer] at [facility]  [address] [place], [place], [place] [address]  [phone]

## 2021-11-10 NOTE — Progress Notes (Signed)
Provider location: Center for [facility] at [facility]   Patient location: Home  I connected withNAME@ on 11/10/21 at  1:55 PM EST by Mychart Video Encounter and verified that I am speaking with the correct person using two identifiers.       I discussed the limitations, risks, security and privacy concerns of performing an evaluation and management service virtually and the availability of in person appointments. I also discussed with the patient that there may be a patient responsible charge related to this service. The patient expressed understanding and agreed to proceed.  Post Partum Visit Note Subjective:   Lameka Disla is a 26 y.o. [phone] female who presents for a postpartum visit. She is 4 weeks postpartum following a normal spontaneous vaginal delivery.  I have fully reviewed the prenatal and intrapartum course. The delivery was at [redacted]w[redacted]d gestational weeks.  Anesthesia: epidural. Postpartum course has been unremarkable. Baby is doing well. Baby is feeding by bottle - Guy Franco . Bleeding  spotting . Bowel function is normal. Bladder function is normal. Patient is not sexually active. Contraception method is Depo-Provera injections. Postpartum depression screening: negative. EPDS= 0   The pregnancy intention screening data noted above was reviewed. Potential methods of contraception were discussed. The patient elected to proceed with No data recorded.   Edinburgh Postnatal Depression Scale - 11/10/21 1353       Edinburgh Postnatal Depression Scale:  In the Past 7 Days   I have been able to laugh and see the funny side of things. 0    I have looked forward with enjoyment to things. 0    I have blamed myself unnecessarily when things went wrong. 0    I have been anxious or worried for no good reason. 0    I have felt scared or panicky for no good reason. 0    Things have been getting on top of me. 0    I have been so unhappy that I have had difficulty  sleeping. 0    I have felt sad or miserable. 0    I have been so unhappy that I have been crying. 0    The thought of harming myself has occurred to me. 0    Edinburgh Postnatal Depression Scale Total 0           Patient reports having good social support and receives help with infant care from her SO.  She endorses safety at home and denies DV/A.No symptoms or reports of depression.  She denies issues with urination, constipation, or diarrhea.  She reports some spotting and expresses desire for Nexplanon.   The following portions of the patient's history were reviewed and updated as appropriate: allergies, current medications, past family history, past medical history, past social history, past surgical history, and problem list.  Review of Systems Pertinent items are noted in HPI.  Objective:  LMP 01/05/2021 (Exact Date)     General:  Alert, oriented and cooperative. Patient is in no acute distress.  Respiratory: Normal respiratory effort, no problems with respiration noted  Mental Status: Normal mood and affect. Normal behavior. Normal judgment and thought content.  Rest of physical exam deferred due to type of encounter   Assessment:   4 weeks postpartum exam Normal Involution Depo Provera Desires Nexplanon  Plan:  -Okay to return to work and sexual activity as desired. -Will provider RTW note in AVS and/or mychart message. -Discussed making appt for insertion of Nexplanon within Depo active period. -Informed that  office will be closing and will send list of sister offices. -Informed of need for pap smear in December 2023  Essential components of care per ACOG recommendations:  1.  Mood and well being: Patient with negative depression screening today. Reviewed local resources for support.  - Patient does use tobacco.  - hx of drug use? No    2. Infant care and feeding:  -Patient currently breastmilk feeding? No  -Social determinants of health (SDOH) reviewed in EPIC.  No concerns  3. Sexuality, contraception and birth spacing - Patient does not want a pregnancy in the next year.  Desired family size is 4 children.  - Reviewed forms of contraception in tiered fashion. Patient desires  Nexplanon .   - Discussed birth spacing of 18 months  4. Sleep and fatigue: Endorses  -Encouraged family/partner/community support of 4 hrs of uninterrupted sleep to help with mood and fatigue  5. Physical Recovery  - Discussed patients delivery and complications - Patient had no laceration, perineal healing reviewed. Patient expressed understanding - Patient has urinary incontinence? No - Patient is safe to resume physical and sexual activity-[employer] on [address] and [employer] on [address]  6.  Health Maintenance - Last pap smear done 10/02/2019 and was normal with negative HPV. No Mammogram  7. No Chronic Disease - PCP follow up  I provided 8 minutes of face-to-face time during this encounter.    No follow-ups on file.  No future appointments.   Cherre Robins, CNM Center for [employer], [facility]

## 2021-11-15 ENCOUNTER — Encounter: Payer: Self-pay | Admitting: *Deleted

## 2022-05-26 NOTE — Congregational Nurse Program (Signed)
Referral to [facility] from Kelton Pillar, job coach, she is currently trying to prepare to get her children back and want to get furniture.  Member reports she went to [employer] and they made a referral to [employer].  She reports she is one of 14 children, she has contact with partners family but not her own, she reports "drama" from her family.  Discussed nurse role at [facility].  Juliann Pulse, RN, Congregational Nurse, [phone]

## 2022-06-20 ENCOUNTER — Encounter (HOSPITAL_COMMUNITY): Payer: Self-pay

## 2022-06-20 ENCOUNTER — Ambulatory Visit (HOSPITAL_COMMUNITY)
Admission: EM | Admit: 2022-06-20 | Discharge: 2022-06-20 | Disposition: A | Discharge status: Home / Self Care | Payer: Medicaid Other | Attending: Family Medicine | Admitting: Family Medicine

## 2022-06-20 DIAGNOSIS — N760 Acute vaginitis: Secondary | ICD-10-CM | POA: Insufficient documentation

## 2022-06-20 LAB — POCT URINALYSIS DIPSTICK, ED / UC
Bilirubin Urine: NEGATIVE
Glucose, UA: NEGATIVE mg/dL
Hgb urine dipstick: NEGATIVE
Ketones, ur: NEGATIVE mg/dL
Nitrite: NEGATIVE
Protein, ur: NEGATIVE mg/dL
Specific Gravity, Urine: 1.02 (ref 1.005–1.030)
Urobilinogen, UA: 0.2 mg/dL (ref 0.0–1.0)
pH: 6.5 (ref 5.0–8.0)

## 2022-06-20 LAB — POC URINE PREG, ED: Preg Test, Ur: NEGATIVE

## 2022-06-20 NOTE — ED Triage Notes (Signed)
Pt presents with vaginal discharge and abdominal pain x 3 days.

## 2022-06-20 NOTE — Discharge Instructions (Addendum)
The urinalysis showed just a few white blood cells, but no blood.  Staff notify you if anything is positive on the swab.

## 2022-06-20 NOTE — ED Provider Notes (Signed)
[facility]    CSN: [id number] Arrival date & time: 06/20/22  1237      History   Chief Complaint No chief complaint on file.   HPI Julie [name] is a 26 y.o. female.   HPI Here for a 3-day history of white vaginal discharge and some cramping in her pelvis.  No fever or vomiting.  She is also had a little bit of back pain.  No dysuria.  Last menstrual cycle was 2 weeks ago.  Last RPR was in January of this year, and her last HIV was in October 2022  Past Medical History:  Diagnosis Date   Anemia    GERD (gastroesophageal reflux disease)    Supervision of other normal pregnancy, antepartum 09/16/2019    Nursing Staff Provider Office Location  [facility] Dating  Ultrasound Language  English Anatomy US  Normal, isolated EIF  Flu Vaccine  Declined Genetic Screen  NIPS:   Low risk girl AFP:   Screen negative  TDaP vaccine   Declined Hgb A1C or  GTT Early  Third trimester    Ref Range & Units 1 d ago  Glucose, Fasting 65 - 91 mg/dL 76   Glucose, 1 hour 65 - 179 mg/dL 104   Glucose, 2 hour 65 - 152 m   SVD (spontaneous vaginal delivery) 10/04/2021   Trichomonas infection     Patient Active Problem List   Diagnosis Date Noted   Trichomoniasis 06/06/2021   Gallstone pancreatitis 12/26/2017   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 Authorizing Provider  acetaminophen (TYLENOL) 500 MG tablet Take 1,000 mg by mouth  every 6 (six) hours as needed for moderate pain.    [provider]  Blood Pressure Monitoring (BLOOD PRESSURE MONITOR AUTOMAT) DEVI 1 Device by Does not apply route daily. Automatic blood pressure cuff regular size. To monitor blood pressure regularly at home. ICD-10 code:Z34.90 03/25/21   Laury Deep, CNM    Family History Family History  Problem Relation Age of Onset   Heart disease Mother    Diabetes Mother    Pancreatic cancer Father    Cancer Maternal Grandfather     Social History Social History   Tobacco Use   Smoking status: Never   Smokeless tobacco: Never  Vaping Use   Vaping Use: Never used  Substance Use Topics   Alcohol use: No   Drug use: No     Allergies   Patient has no known allergies.   Review of Systems Review of Systems   Physical Exam Triage Vital Signs ED Triage Vitals [06/20/22 1313]  Enc Vitals Group     BP 110/78     Pulse Rate 82  Resp 18     Temp 98.7 F (37.1 C)     Temp Source Oral     SpO2 98 %     Weight      Height      Head Circumference      Peak Flow      Pain Score      Pain Loc      Pain Edu?      Excl. in [place]?    No data found.  Updated Vital Signs BP 110/78 (BP Location: Left Arm)   Pulse 82   Temp 98.7 F (37.1 C) (Oral)   Resp 18   SpO2 98%   Visual Acuity Right Eye Distance:   Left Eye Distance:   Bilateral Distance:    Right Eye Near:   Left Eye Near:    Bilateral Near:     Physical Exam Vitals reviewed.  Constitutional:      General: She is not in acute distress.    Appearance: She is not ill-appearing, toxic-appearing or diaphoretic.  HENT:     Mouth/Throat:     Mouth: Mucous membranes are moist.     Pharynx: No oropharyngeal exudate or posterior oropharyngeal erythema.  Eyes:     Extraocular Movements: Extraocular movements intact.     Conjunctiva/sclera: Conjunctivae normal.     Pupils: Pupils are equal, round, and reactive to light.  Cardiovascular:     Rate and  Rhythm: Normal rate and regular rhythm.     Heart sounds: No murmur heard. Pulmonary:     Effort: Pulmonary effort is normal.     Breath sounds: No stridor. No wheezing, rhonchi or rales.  Abdominal:     General: There is no distension.     Palpations: Abdomen is soft. There is no mass.     Tenderness: There is no abdominal tenderness. There is no guarding.  Musculoskeletal:     Cervical back: Neck supple.  Lymphadenopathy:     Cervical: No cervical adenopathy.  Skin:    Coloration: Skin is not jaundiced or pale.  Neurological:     General: No focal deficit present.     Mental Status: She is alert and oriented to person, place, and time.  Psychiatric:        Behavior: Behavior normal.      UC Treatments / Results  Labs (all labs ordered are listed, but only abnormal results are displayed) Labs Reviewed  POCT URINALYSIS DIPSTICK, ED / UC - Abnormal; Notable for the following components:      Result Value   Leukocytes,Ua SMALL (*)    All other components within normal limits  POC URINE PREG, ED  CERVICOVAGINAL ANCILLARY ONLY    EKG   Radiology No results found.  Procedures Procedures (including critical care time)  Medications Ordered in UC Medications - No data to display  Initial Impression / Assessment and Plan / UC Course  I have reviewed the triage vital signs and the nursing notes.  Pertinent labs & imaging results that were available during my care of the patient were reviewed by me and considered in my medical decision making (see chart for details).    There is a small amount of leukocytes on the urinalysis.  She is given education on safe sex practices.  We decided to wait on treatment till we have results back on her swab      Final Clinical Impressions(s) / UC Diagnoses   Final diagnoses:  Acute vaginitis  Discharge Instructions      The urinalysis showed just a few white blood cells, but no blood.  Staff notify you if anything  is positive on the swab.     ED Prescriptions   None    PDMP not reviewed this encounter.   Zenia Resides, MD 06/20/22 1332

## 2022-06-21 ENCOUNTER — Telehealth (HOSPITAL_COMMUNITY): Payer: Self-pay | Admitting: Emergency Medicine

## 2022-06-21 LAB — CERVICOVAGINAL ANCILLARY ONLY
Bacterial Vaginitis (gardnerella): NEGATIVE
Candida Glabrata: NEGATIVE
Candida Vaginitis: NEGATIVE
Chlamydia: POSITIVE — AB
Comment: NEGATIVE
Comment: NEGATIVE
Comment: NEGATIVE
Comment: NEGATIVE
Comment: NEGATIVE
Comment: NORMAL
Neisseria Gonorrhea: NEGATIVE
Trichomonas: NEGATIVE

## 2022-06-21 MED ORDER — DOXYCYCLINE HYCLATE 100 MG PO CAPS: 100.0000 mg | ORAL_CAPSULE | Freq: Two times a day (BID) | ORAL | 0 refills | Status: AC

## 2022-07-03 ENCOUNTER — Encounter (HOSPITAL_COMMUNITY): Payer: Self-pay | Admitting: Emergency Medicine

## 2022-07-03 ENCOUNTER — Emergency Department (HOSPITAL_COMMUNITY)
Admission: EM | Admit: 2022-07-03 | Discharge: 2022-07-03 | Disposition: A | Discharge status: Home / Self Care | Payer: Medicaid Other | Attending: Emergency Medicine | Admitting: Emergency Medicine

## 2022-07-03 DIAGNOSIS — O209 Hemorrhage in early pregnancy, unspecified: Secondary | ICD-10-CM | POA: Insufficient documentation

## 2022-07-03 DIAGNOSIS — Z3201 Encounter for pregnancy test, result positive: Secondary | ICD-10-CM

## 2022-07-03 DIAGNOSIS — N939 Abnormal uterine and vaginal bleeding, unspecified: Secondary | ICD-10-CM

## 2022-07-03 LAB — I-STAT BETA HCG BLOOD, ED (MC, WL, AP ONLY): I-stat hCG, quantitative: 10.7 m[IU]/mL — ABNORMAL HIGH (ref <5)

## 2022-07-03 NOTE — ED Triage Notes (Signed)
Pt here from UC sent over for bleed test for pregnancy , pt had 4 positive test this week but started bleeding today soaked a whols pad with some lower abd pain

## 2022-07-03 NOTE — ED Provider Notes (Signed)
[facility]-EMERGENCY DEPT Provider Note   CSN: [id number] Arrival date & time: 07/03/22  1712     History  No chief complaint on file.   Julie [name] is a 26 y.o. female who presents to the emergency department requesting a blood pregnancy test. Patient states that she took four home pregnancy tests, the first being last week, that were all positive. She started having some vaginal bleeding today and lower abdominal cramping and went to urgent care. Her urine pregnancy test there was negative. She was sent to the ER for further evaluation. She states she has soaked through one pad since this morning. LMP was beginning of last September. Hx of cholecystectomy.   HPI     Home Medications Prior to Admission medications   Medication Sig Start Date End Date Taking? Authorizing Provider  acetaminophen (TYLENOL) 500 MG tablet Take 1,000 mg by mouth every 6 (six) hours as needed for moderate pain.    [provider]  Blood Pressure Monitoring (BLOOD PRESSURE MONITOR AUTOMAT) DEVI 1 Device by Does not apply route daily. Automatic blood pressure cuff regular size. To monitor blood pressure regularly at home. ICD-10 code:Z34.90 03/25/21   Raelyn Mora, CNM      Allergies    Patient has no known allergies.    Review of Systems   Review of Systems  Gastrointestinal:  Positive for abdominal pain.  Genitourinary:  Positive for vaginal bleeding.  All other systems reviewed and are negative.   Physical Exam Updated Vital Signs LMP 06/03/2022 (Exact Date)  Physical Exam Vitals and nursing note reviewed.  Constitutional:      Appearance: Normal appearance.  HENT:     Head: Normocephalic and atraumatic.  Eyes:     Conjunctiva/sclera: Conjunctivae normal.  Pulmonary:     Effort: Pulmonary effort is normal. No respiratory distress.  Skin:    General: Skin is warm and dry.  Neurological:     Mental Status: She is alert.  Psychiatric:        Mood and  Affect: Mood normal.        Behavior: Behavior normal.     ED Results / Procedures / Treatments   Labs (all labs ordered are listed, but only abnormal results are displayed) Labs Reviewed  I-STAT BETA HCG BLOOD, ED ([facility], [facility], [facility] ONLY) - Abnormal; Notable for the following components:      Result Value   I-stat hCG, quantitative 10.7 (*)    All other components within normal limits    EKG None  Radiology No results found.  Procedures Procedures    Medications Ordered in ED Medications - No data to display  ED Course/ Medical Decision Making/ A&P                           Medical Decision Making Patient is a 26 year old female G4P4 who presents to the emergency department requesting a blood pregnancy test. Had 4 positive home tests. Negative test at urgent care today. Starting today with some vaginal bleeding and lower abdominal cramping.  On examination, patient is in no acute distress, normal vitals.   Beta hcg 10.7  I discussed the results with the patient and her significant other. Discussed that her test results are consistent with the first week of pregnancy. However, we discussed that with her bleeding she could have had a very early miscarriage vs normal bleeding in first trimester pregnancy. Abdominal cramping is consistent, but not  severe. Discussed ultrasound at this time and how it would be of low yield this early in pregnancy. Stressed the importance of following up with OBGYN, and returning to the ER for any worsening pain or bleeding. Discussed possibility of ectopic pregnancy, but in the setting of mild pain and barely positive beta HCG, low concern at this current time. Patient is agreeable to the plan and all questions answered.   Final Clinical Impression(s) / ED Diagnoses Final diagnoses:  Positive pregnancy test  Vaginal bleeding    Rx / DC Orders ED Discharge Orders     None      Portions of this report may have been transcribed using voice  recognition software. Every effort was made to ensure accuracy; however, inadvertent computerized transcription errors may be present.    Estill Cotta 07/03/22 1859    Ezequiel Essex, MD 07/03/22 1901

## 2022-07-03 NOTE — Discharge Instructions (Signed)
You were seen in the emergency department for vaginal bleeding and requesting a pregnancy test.  As we discussed, your blood pregnancy test was positive, but your hormone levels are very low. Your beta hcg level was 10.7. This would indicate you're roughly in the first week of pregnancy.   We discussed ultrasounds today, and I do not believe they would be of significant benefit this early. However, it is incredibly important you follow up with your OBGYN this week to recheck your blood levels and discuss need for ultrasound at that time.   Continue to monitor how you're doing and return to the ER for new or worsening symptoms such as severe abdominal pain or worsening bleeding.

## 2022-07-04 ENCOUNTER — Emergency Department (HOSPITAL_COMMUNITY)
Admission: EM | Admit: 2022-07-04 | Discharge: 2022-07-05 | Discharge status: Against Medical Advice | Payer: Medicaid Other | Attending: Emergency Medicine | Admitting: Emergency Medicine

## 2022-07-04 ENCOUNTER — Encounter (HOSPITAL_COMMUNITY): Payer: Self-pay

## 2022-07-04 DIAGNOSIS — O26859 Spotting complicating pregnancy, unspecified trimester: Secondary | ICD-10-CM | POA: Insufficient documentation

## 2022-07-04 DIAGNOSIS — D649 Anemia, unspecified: Secondary | ICD-10-CM | POA: Diagnosis not present

## 2022-07-04 DIAGNOSIS — Z5321 Procedure and treatment not carried out due to patient leaving prior to being seen by health care provider: Secondary | ICD-10-CM | POA: Diagnosis not present

## 2022-07-04 LAB — CBC
HCT: 35.6 % — ABNORMAL LOW (ref 36.0–46.0)
Hemoglobin: 11.8 g/dL — ABNORMAL LOW (ref 12.0–15.0)
MCH: 30.6 pg (ref 26.0–34.0)
MCHC: 33.1 g/dL (ref 30.0–36.0)
MCV: 92.5 fL (ref 80.0–100.0)
Platelets: 301 10*3/uL (ref 150–400)
RBC: 3.85 MIL/uL — ABNORMAL LOW (ref 3.87–5.11)
RDW: 11.8 % (ref 11.5–15.5)
WBC: 4.5 10*3/uL (ref 4.0–10.5)
nRBC: 0 % (ref 0.0–0.2)

## 2022-07-04 LAB — BASIC METABOLIC PANEL
Anion gap: 5 (ref 5–15)
BUN: 6 mg/dL (ref 6–20)
CO2: 28 mmol/L (ref 22–32)
Calcium: 8.6 mg/dL — ABNORMAL LOW (ref 8.9–10.3)
Chloride: 106 mmol/L (ref 98–111)
Creatinine, Ser: 0.56 mg/dL (ref 0.44–1.00)
GFR, Estimated: >60 mL/min (ref >60)
Glucose, Bld: 89 mg/dL (ref 70–99)
Potassium: 3.7 mmol/L (ref 3.5–5.1)
Sodium: 139 mmol/L (ref 135–145)

## 2022-07-04 LAB — HCG, QUANTITATIVE, PREGNANCY: hCG, Beta Chain, Quant, S: 7 m[IU]/mL — ABNORMAL HIGH (ref <5)

## 2022-07-04 LAB — ABO/RH: ABO/RH(D): O POS

## 2022-07-04 NOTE — ED Provider Triage Note (Signed)
Emergency Medicine Provider Triage Evaluation Note  Julie [name] , a 26 y.o. female  was evaluated in triage.  Pt complains of vaginal bleeding. She is [redacted] week pregnant. States that she has had bleeding since yesterday and is filling up a pad every few hours. Hx of anemia. Was seen yesterday and had hcg 10. Has not follow-up with anyone since yesterday. Denies any abdominal pain.  Review of Systems  Positive:  Negative:   Physical Exam  BP (!) 151/96   Pulse 99   Temp 98.5 F (36.9 C) (Oral)   Resp 18   Ht 5\' 5"  (1.651 m)   Wt 65.8 kg   LMP 06/03/2022 (Exact Date)   SpO2 100%   BMI 24.13 kg/m  Gen:   Awake, no distress   Resp:  Normal effort  MSK:   Moves extremities without difficulty  Other:    Medical Decision Making  Medically screening exam initiated at 5:11 PM.  Appropriate orders placed.  Julie [name] was informed that the remainder of the evaluation will be completed by another provider, this initial triage assessment does not replace that evaluation, and the importance of remaining in the ED until their evaluation is complete.     Bud Face, PA-C 07/04/22 1714

## 2022-07-04 NOTE — ED Triage Notes (Signed)
Pt arrived via POV, c/o vaginal bleeding. States she is one week pregnant.

## 2022-07-17 ENCOUNTER — Encounter (HOSPITAL_COMMUNITY): Payer: Self-pay

## 2022-07-17 ENCOUNTER — Ambulatory Visit (HOSPITAL_COMMUNITY)
Admission: EM | Admit: 2022-07-17 | Discharge: 2022-07-17 | Disposition: A | Discharge status: Home / Self Care | Payer: Medicaid Other | Attending: Physician Assistant | Admitting: Physician Assistant

## 2022-07-17 DIAGNOSIS — R300 Dysuria: Secondary | ICD-10-CM | POA: Insufficient documentation

## 2022-07-17 DIAGNOSIS — Z113 Encounter for screening for infections with a predominantly sexual mode of transmission: Secondary | ICD-10-CM | POA: Insufficient documentation

## 2022-07-17 LAB — HEPATITIS PANEL, ACUTE
HCV Ab: NONREACTIVE
Hep A IgM: NONREACTIVE
Hep B C IgM: NONREACTIVE
Hepatitis B Surface Ag: NONREACTIVE

## 2022-07-17 LAB — POCT URINALYSIS DIPSTICK, ED / UC
Bilirubin Urine: NEGATIVE
Glucose, UA: NEGATIVE mg/dL
Hgb urine dipstick: NEGATIVE
Ketones, ur: NEGATIVE mg/dL
Nitrite: NEGATIVE
Protein, ur: NEGATIVE mg/dL
Specific Gravity, Urine: 1.025 (ref 1.005–1.030)
Urobilinogen, UA: 0.2 mg/dL (ref 0.0–1.0)
pH: 6.5 (ref 5.0–8.0)

## 2022-07-17 LAB — POC URINE PREG, ED: Preg Test, Ur: NEGATIVE

## 2022-07-17 LAB — HIV ANTIBODY (ROUTINE TESTING W REFLEX): HIV Screen 4th Generation wRfx: NONREACTIVE

## 2022-07-17 NOTE — ED Triage Notes (Signed)
Pain with urination for 2 days. Patient states she has had a UTI before. No vaginal discharge or itching. No smell or color change to the urine.

## 2022-07-17 NOTE — ED Provider Notes (Signed)
[facility]    CSN: [id number] Arrival date & time: 07/17/22  1050      History   Chief Complaint Chief Complaint  Patient presents with   Urinary Tract Infection    HPI Julie [name] is a 26 y.o. female.   Patient here today for evaluation of pain with urination she has had for 2 days.  She notes she has had UTIs in the past.  She denies any vaginal discharge or itching.  She has not had any smell or color change to her urine.  She denies any fevers.  She has not any abdominal pain or back pain.  She denies any nausea or vomiting.  She would like STD screening but denies any known exposures or concerns for same.  The history is provided by the patient.    Past Medical History:  Diagnosis Date   Anemia    GERD (gastroesophageal reflux disease)    Supervision of other normal pregnancy, antepartum 09/16/2019    Nursing Staff Provider Office Location  [facility] Dating  Ultrasound Language  English Anatomy US  Normal, isolated EIF  Flu Vaccine  Declined Genetic Screen  NIPS:   Low risk girl AFP:   Screen negative  TDaP vaccine   Declined Hgb A1C or  GTT Early  Third trimester    Ref Range & Units 1 d ago  Glucose, Fasting 65 - 91 mg/dL 76   Glucose, 1 hour 65 - 179 mg/dL 814   Glucose, 2 hour 65 - 152 m   SVD (spontaneous vaginal delivery) 10/04/2021   Trichomonas infection     Patient Active Problem List   Diagnosis Date Noted   Trichomoniasis 06/06/2021   Gallstone pancreatitis 12/26/2017   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 Authorizing Provider  acetaminophen (TYLENOL) 500 MG tablet Take 1,000 mg by mouth every 6 (six) hours as needed for moderate pain.    [provider]  Blood Pressure Monitoring (BLOOD PRESSURE MONITOR AUTOMAT) DEVI 1 Device by Does not apply route daily. Automatic blood pressure cuff regular size. To monitor blood pressure regularly at home. ICD-10 code:Z34.90 03/25/21   Raelyn Mora, CNM    Family History Family History  Problem Relation Age of Onset   Heart disease Mother    Diabetes Mother    Pancreatic cancer Father    Cancer Maternal Grandfather     Social History Social History   Tobacco Use   Smoking status: Never   Smokeless tobacco: Never  Vaping Use   Vaping Use: Never used  Substance Use Topics   Alcohol use: No   Drug use: No     Allergies   Patient has no known  allergies.   Review of Systems Review of Systems  Constitutional:  Negative for chills and fever.  Eyes:  Negative for discharge and redness.  Gastrointestinal:  Negative for abdominal pain, nausea and vomiting.  Genitourinary:  Positive for dysuria. Negative for vaginal discharge.     Physical Exam Triage Vital Signs ED Triage Vitals  Enc Vitals Group     BP      Pulse      Resp      Temp      Temp src      SpO2      Weight      Height      Head Circumference      Peak Flow      Pain Score      Pain Loc      Pain Edu?      Excl. in [place]?    No data found.  Updated Vital Signs BP 98/71 (BP Location: Left Arm)   Pulse (!) 102   Temp 98.3 F (36.8 C) (Oral)   LMP 06/03/2022 (Exact Date)   SpO2 97%   Breastfeeding No   Physical Exam Vitals and nursing note reviewed.  Constitutional:      General: She is not in acute distress.    Appearance: Normal appearance. She is not ill-appearing.  HENT:     Head: Normocephalic and atraumatic.  Eyes:      Conjunctiva/sclera: Conjunctivae normal.  Cardiovascular:     Rate and Rhythm: Normal rate.  Pulmonary:     Effort: Pulmonary effort is normal.  Neurological:     Mental Status: She is alert.  Psychiatric:        Mood and Affect: Mood normal.        Behavior: Behavior normal.        Thought Content: Thought content normal.      UC Treatments / Results  Labs (all labs ordered are listed, but only abnormal results are displayed) Labs Reviewed  POCT URINALYSIS DIPSTICK, ED / UC - Abnormal; Notable for the following components:      Result Value   Leukocytes,Ua TRACE (*)    All other components within normal limits  URINE CULTURE  HIV ANTIBODY (ROUTINE TESTING W REFLEX)  HEPATITIS PANEL, ACUTE  RPR  POC URINE PREG, ED  CERVICOVAGINAL ANCILLARY ONLY    EKG   Radiology No results found.  Procedures Procedures (including critical care time)  Medications Ordered in UC Medications - No data to display  Initial Impression / Assessment and Plan / UC Course  I have reviewed the triage vital signs and the nursing notes.  Pertinent labs & imaging results that were available during my care of the patient were reviewed by me and considered in my medical decision making (see chart for details).   UA with only trace LE, will order culture for further evaluation and recommended follow up with any worsening symptoms while awaiting results. STD screening ordered. Encouraged follow up with any further concerns.    Final Clinical Impressions(s) / UC Diagnoses   Final diagnoses:  Dysuria  Screening for STD (sexually transmitted disease)   Discharge Instructions   None    ED Prescriptions   None    PDMP not reviewed this encounter.   Francene Finders, PA-C 07/17/22 1304

## 2022-07-18 LAB — CERVICOVAGINAL ANCILLARY ONLY
Bacterial Vaginitis (gardnerella): POSITIVE — AB
Candida Glabrata: NEGATIVE
Candida Vaginitis: NEGATIVE
Chlamydia: NEGATIVE
Comment: NEGATIVE
Comment: NEGATIVE
Comment: NEGATIVE
Comment: NEGATIVE
Comment: NEGATIVE
Comment: NORMAL
Neisseria Gonorrhea: NEGATIVE
Trichomonas: NEGATIVE

## 2022-07-18 LAB — URINE CULTURE: Culture: 10000 — AB

## 2022-07-18 LAB — RPR: RPR Ser Ql: NONREACTIVE

## 2022-07-20 ENCOUNTER — Telehealth (HOSPITAL_COMMUNITY): Payer: Self-pay | Admitting: Emergency Medicine

## 2022-07-20 MED ORDER — METRONIDAZOLE 500 MG PO TABS: 500.0000 mg | ORAL_TABLET | Freq: Two times a day (BID) | ORAL | 0 refills | Status: DC

## 2022-12-05 DIAGNOSIS — F419 Anxiety disorder, unspecified: Secondary | ICD-10-CM | POA: Diagnosis not present

## 2022-12-22 ENCOUNTER — Ambulatory Visit (HOSPITAL_COMMUNITY)
Admission: EM | Admit: 2022-12-22 | Discharge: 2022-12-22 | Disposition: A | Discharge status: Home / Self Care | Payer: Medicaid Other | Attending: Internal Medicine | Admitting: Internal Medicine

## 2022-12-22 ENCOUNTER — Encounter (HOSPITAL_COMMUNITY): Payer: Self-pay

## 2022-12-22 DIAGNOSIS — G43109 Migraine with aura, not intractable, without status migrainosus: Secondary | ICD-10-CM | POA: Diagnosis not present

## 2022-12-22 DIAGNOSIS — H53143 Visual discomfort, bilateral: Secondary | ICD-10-CM

## 2022-12-22 MED ORDER — SUMATRIPTAN SUCCINATE 50 MG PO TABS: 50.0000 mg | ORAL_TABLET | Freq: Once | ORAL | 0 refills | Status: DC

## 2022-12-22 NOTE — ED Triage Notes (Signed)
Pt reports eye pain and pressure when she turns her head. Pt reports headache x 2 days. Pt has not taken any medication at this time.

## 2022-12-22 NOTE — Discharge Instructions (Addendum)
Please maintain adequate hydration Please take medications as prescribed If you have worsening symptoms please return to urgent care to be reevaluated You could take over-the-counter melatonin to help you sleep

## 2022-12-23 NOTE — ED Provider Notes (Signed)
[place]    CSN: [id number] Arrival date & time: 12/22/22  1254      History   Chief Complaint Chief Complaint  Patient presents with   Eye Problem   Headache    HPI Julie [name] is a 27 y.o. female with a history of migraines comes to the urgent care with 2-day history generalized headache associated with light sensitivity and floaters in the visual field.  Headache is aggravated by bright lights and relieved when she is able to get some rest.  She works 2 jobs and she is currently in school.  She works for about 14 hours in a day.  She is having difficulty sleeping because of the long work hours.  Headache is severe in nature, constant and not relieved by taking NSAIDs.  No nausea or vomiting.  No runny nose sneezing cough or sore throat.  No sick contacts.  No numbness or tingling, difficulty finding her words.  No speech difficulties or slurred speech.  No double vision or weakness in the extremities.  Patient currently denies any headaches but she continues to have some light sensitivity HPI  Past Medical History:  Diagnosis Date   Anemia    GERD (gastroesophageal reflux disease)    Supervision of other normal pregnancy, antepartum 09/16/2019    Nursing Staff Provider Office Location  [facility] Dating  Ultrasound Language  English Anatomy US  Normal, isolated EIF  Flu Vaccine  Declined Genetic Screen  NIPS:   Low risk girl AFP:   Screen negative  TDaP vaccine   Declined Hgb A1C or  GTT Early  Third trimester    Ref Range & Units 1 d ago  Glucose, Fasting 65 - 91 mg/dL 76   Glucose, 1 hour 65 - 179 mg/dL 104   Glucose, 2 hour 65 - 152 m   SVD (spontaneous vaginal delivery) 10/04/2021   Trichomonas infection     Patient Active Problem List   Diagnosis Date Noted   Trichomoniasis 06/06/2021   Gallstone pancreatitis 12/26/2017   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 Authorizing Provider  SUMAtriptan (IMITREX) 50 MG tablet Take 1 tablet (50 mg total) by mouth once for 1 dose. May repeat in 2 hours if headache persists or recurs. 12/22/22 12/22/22 Yes Margarett Viti, Myrene Galas, MD  acetaminophen (TYLENOL) 500 MG tablet Take 1,000 mg by mouth every 6 (six) hours as needed for moderate pain.    [provider]  Blood Pressure Monitoring (BLOOD PRESSURE MONITOR AUTOMAT) DEVI 1 Device by Does not apply route daily. Automatic blood pressure cuff regular size. To monitor blood pressure regularly at home. ICD-10 code:Z34.90 03/25/21   Laury Deep, CNM  metroNIDAZOLE (FLAGYL)  500 MG tablet Take 1 tablet (500 mg total) by mouth 2 (two) times daily. 07/20/22   Courtnie Brenes, Myrene Galas, MD    Family History Family History  Problem Relation Age of Onset   Heart disease Mother    Diabetes Mother    Pancreatic cancer Father    Cancer Maternal Grandfather     Social History Social History   Tobacco Use   Smoking status: Never   Smokeless tobacco: Never  Vaping Use   Vaping Use: Never used  Substance Use Topics   Alcohol use: No   Drug use: No     Allergies   Patient has no known allergies.   Review of Systems Review of Systems As per HPI  Physical Exam Triage Vital Signs ED Triage Vitals [12/22/22 1416]  Enc Vitals Group     BP 137/84     Pulse Rate 91     Resp 18     Temp 98.1 F (36.7 C)     Temp Source Oral     SpO2 99 %     Weight      Height      Head Circumference      Peak Flow       Pain Score      Pain Loc      Pain Edu?      Excl. in [place]?    No data found.  Updated Vital Signs BP 137/84 (BP Location: Left Arm)   Pulse 91   Temp 98.1 F (36.7 C) (Oral)   Resp 18   SpO2 99%   Visual Acuity Right Eye Distance: 20/30 Left Eye Distance: 20/30 Bilateral Distance: 20/30  Right Eye Near: R Near: 20/30 Left Eye Near:  L Near: 20/30 Bilateral Near:  20/30  Physical Exam Vitals and nursing note reviewed.  Constitutional:      General: She is not in acute distress.    Appearance: She is well-developed. She is not ill-appearing.  HENT:     Head: Normocephalic and atraumatic.  Eyes:     General: No visual field deficit or scleral icterus.    Extraocular Movements: Extraocular movements intact.     Pupils: Pupils are equal, round, and reactive to light. Pupils are equal.  Cardiovascular:     Rate and Rhythm: Normal rate and regular rhythm.  Abdominal:     General: Bowel sounds are normal.     Palpations: Abdomen is soft.  Musculoskeletal:     Cervical back: Normal range of motion.  Neurological:     Mental Status: She is alert.     GCS: GCS motor subscore is 6.     Cranial Nerves: No cranial nerve deficit, dysarthria or facial asymmetry.     Sensory: No sensory deficit.     Motor: No weakness.     Coordination: Romberg sign negative. Coordination normal.     Gait: Gait normal.     Deep Tendon Reflexes: Reflexes normal.  Psychiatric:        Mood and Affect: Mood normal.        Behavior: Behavior normal.      UC Treatments / Results  Labs (all labs ordered are listed, but only abnormal results are displayed) Labs Reviewed - No data to display  EKG   Radiology No results found.  Procedures Procedures (including critical care time)  Medications Ordered in UC Medications - No data to display  Initial Impression / Assessment and Plan / UC Course  I have  reviewed the triage vital signs and the nursing notes.  Pertinent labs &  imaging results that were available during my care of the patient were reviewed by me and considered in my medical decision making (see chart for details).     1.  Migraine with aura and without status migrainosus, not intractable: Imitrex 50 mg to be taken at the outset of the headache.  May be repeated in 2 hours if there is no improvement in the headache Patient is advised to maintain adequate hydration Neuro exam was unremarkable.  No neurologic deficits. Return precautions given. Final Clinical Impressions(s) / UC Diagnoses   Final diagnoses:  Photophobia of both eyes  Migraine with aura and without status migrainosus, not intractable     Discharge Instructions      Please maintain adequate hydration Please take medications as prescribed If you have worsening symptoms please return to urgent care to be reevaluated You could take over-the-counter melatonin to help you sleep   ED Prescriptions     Medication Sig Dispense Auth. Provider   SUMAtriptan (IMITREX) 50 MG tablet Take 1 tablet (50 mg total) by mouth once for 1 dose. May repeat in 2 hours if headache persists or recurs. 10 tablet Brittyn Salaz, Myrene Galas, MD      PDMP not reviewed this encounter.   Chase Picket, MD 12/23/22 [phone]

## 2022-12-24 ENCOUNTER — Ambulatory Visit (HOSPITAL_COMMUNITY)
Admission: EM | Admit: 2022-12-24 | Discharge: 2022-12-24 | Disposition: A | Discharge status: Home / Self Care | Payer: Medicaid Other | Attending: Emergency Medicine | Admitting: Emergency Medicine

## 2022-12-24 ENCOUNTER — Encounter (HOSPITAL_COMMUNITY): Payer: Self-pay

## 2022-12-24 DIAGNOSIS — H5713 Ocular pain, bilateral: Secondary | ICD-10-CM

## 2022-12-24 MED ORDER — OLOPATADINE HCL 0.1 % OP SOLN: 1.0000 [drp] | Freq: Two times a day (BID) | OPHTHALMIC | 12 refills | Status: DC

## 2022-12-24 NOTE — ED Triage Notes (Signed)
Pt is here for a follow-up on headaches and eye pain. Pt stated she took two doses of Sumatriptan with no relief.

## 2022-12-24 NOTE — Discharge Instructions (Addendum)
Continue sumatriptan if headache occurs. If no improvement after 2 hours you can take a second pill.  You can take up to 800 mg ibuprofen every 6 hours Alternate with tylenol  Try the eye drops as well, they may be soothing.  Please return if needed! QR code on the last page can be scanned to set up with primary care provider

## 2022-12-25 NOTE — ED Provider Notes (Signed)
[place]    CSN: [id number] Arrival date & time: 12/24/22  1711      History   Chief Complaint Chief Complaint  Patient presents with   Headache   Facial Swelling    HPI Julie [name] is a 27 y.o. female.  Here for follow up Seen 2 days ago for headache. Was started on sumatriptan. She has taken 2 doses so far. Headache is better but still having some pain behind the eyes. Feels "eye ache" with looking side to side. Denies vision changes, head or eye injury, dizziness, drainage or crusting.  Has not taken other mediations for symptoms   Past Medical History:  Diagnosis Date   Anemia    GERD (gastroesophageal reflux disease)    Supervision of other normal pregnancy, antepartum 09/16/2019    Nursing Staff Provider Office Location  [facility] Dating  Ultrasound Language  English Anatomy US  Normal, isolated EIF  Flu Vaccine  Declined Genetic Screen  NIPS:   Low risk girl AFP:   Screen negative  TDaP vaccine   Declined Hgb A1C or  GTT Early  Third trimester    Ref Range & Units 1 d ago  Glucose, Fasting 65 - 91 mg/dL 76   Glucose, 1 hour 65 - 179 mg/dL 104   Glucose, 2 hour 65 - 152 m   SVD (spontaneous vaginal delivery) 10/04/2021   Trichomonas infection     Patient Active Problem List   Diagnosis Date Noted   Trichomoniasis 06/06/2021   Gallstone pancreatitis 12/26/2017   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 Authorizing Provider  olopatadine (PATANOL) 0.1 % ophthalmic solution Place 1 drop into both eyes 2 (two) times daily. 12/24/22  Yes Jourden Gilson, Wells Guiles, PA-C  acetaminophen (TYLENOL) 500 MG tablet Take 1,000 mg by mouth every 6 (six) hours as needed for moderate pain.    [provider]  Blood Pressure Monitoring (BLOOD PRESSURE MONITOR AUTOMAT) DEVI 1 Device by Does not apply route daily. Automatic blood pressure cuff regular size. To monitor blood pressure regularly at home. ICD-10 code:Z34.90 03/25/21   Laury Deep, CNM  metroNIDAZOLE (FLAGYL) 500 MG tablet Take 1 tablet (500 mg total) by mouth 2 (two) times daily. 07/20/22   Lamptey, Myrene Galas, MD  SUMAtriptan (IMITREX) 50 MG tablet Take 1 tablet (50 mg total) by mouth once for 1 dose. May repeat in 2 hours if headache persists or recurs. 12/22/22 12/22/22  LampteyMyrene Galas, MD    Family History Family History  Problem Relation Age of Onset   Heart disease Mother    Diabetes Mother    Pancreatic cancer Father    Cancer Maternal Grandfather     Social  History Social History   Tobacco Use   Smoking status: Never   Smokeless tobacco: Never  Vaping Use   Vaping Use: Never used  Substance Use Topics   Alcohol use: No   Drug use: No     Allergies   Patient has no known allergies.   Review of Systems Review of Systems As per HPI  Physical Exam Triage Vital Signs ED Triage Vitals [12/24/22 1751]  Enc Vitals Group     BP 117/75     Pulse Rate (!) 101     Resp 18     Temp 98.8 F (37.1 C)     Temp Source Oral     SpO2 99 %     Weight      Height      Head Circumference      Peak Flow      Pain Score      Pain Loc      Pain Edu?      Excl. in [place]?    No data found.  Updated Vital Signs BP 117/75 (BP Location: Left Arm)   Pulse (!) 101   Temp 98.8 F (37.1 C) (Oral)   Resp 18   SpO2 99%     Physical Exam Vitals and nursing note reviewed.  Constitutional:      General: She is not in acute  distress.    Appearance: Normal appearance. She is not ill-appearing.  HENT:     Nose: No rhinorrhea.  Eyes:     General:        Right eye: No discharge.        Left eye: No discharge.     Extraocular Movements: Extraocular movements intact.     Conjunctiva/sclera: Conjunctivae normal.     Pupils: Pupils are equal, round, and reactive to light.  Cardiovascular:     Rate and Rhythm: Normal rate and regular rhythm.  Pulmonary:     Effort: Pulmonary effort is normal.  Musculoskeletal:     Cervical back: Normal range of motion.  Skin:    General: Skin is warm and dry.  Neurological:     General: No focal deficit present.     Mental Status: She is alert and oriented to person, place, and time.     Cranial Nerves: Cranial nerves 2-12 are intact. No cranial nerve deficit.     UC Treatments / Results  Labs (all labs ordered are listed, but only abnormal results are displayed) Labs Reviewed - No data to display  EKG   Radiology No results found.  Procedures Procedures (including critical care time)  Medications Ordered in UC Medications - No data to display  Initial Impression / Assessment and Plan / UC Course  I have reviewed the triage vital signs and the nursing notes.  Pertinent labs & imaging results that were available during my care of the patient were reviewed by me and considered in my medical decision making (see chart for details).  Good exam. Reassuring headache improved. Discussed appropriate use of sumatriptan. Additionally can try tylenol and ibuprofen for pain. Sent olopatadine eye drops for soothing. Advised to follow with PCP  Final Clinical Impressions(s) / UC Diagnoses   Final diagnoses:  Eye pain, bilateral     Discharge Instructions      Continue sumatriptan if headache occurs. If no improvement after 2 hours you can take a second pill.  You can take up to 800 mg ibuprofen every 6 hours Alternate with tylenol  Try the eye drops as well,  they may be soothing.  Please return if needed! QR code on the last page can be scanned to set up with primary care provider    ED Prescriptions     Medication Sig Dispense Auth. Provider   olopatadine (PATANOL) 0.1 % ophthalmic solution Place 1 drop into both eyes 2 (two) times daily. 5 mL Marquet Faircloth, Wells Guiles, PA-C      PDMP not reviewed this encounter.   [employer], Vernice Jefferson 12/25/22 1024

## 2023-01-03 DIAGNOSIS — F419 Anxiety disorder, unspecified: Secondary | ICD-10-CM | POA: Diagnosis not present

## 2023-01-17 DIAGNOSIS — F419 Anxiety disorder, unspecified: Secondary | ICD-10-CM | POA: Diagnosis not present

## 2023-01-18 DIAGNOSIS — R519 Headache, unspecified: Secondary | ICD-10-CM | POA: Diagnosis not present

## 2023-01-24 DIAGNOSIS — F419 Anxiety disorder, unspecified: Secondary | ICD-10-CM | POA: Diagnosis not present

## 2023-01-31 DIAGNOSIS — F419 Anxiety disorder, unspecified: Secondary | ICD-10-CM | POA: Diagnosis not present

## 2023-02-01 ENCOUNTER — Emergency Department (HOSPITAL_COMMUNITY): Payer: Medicaid Other

## 2023-02-01 ENCOUNTER — Emergency Department (HOSPITAL_COMMUNITY)
Admission: EM | Admit: 2023-02-01 | Discharge: 2023-02-01 | Disposition: A | Discharge status: Home / Self Care | Payer: Medicaid Other | Attending: Student | Admitting: Student

## 2023-02-01 ENCOUNTER — Other Ambulatory Visit: Payer: Self-pay

## 2023-02-01 ENCOUNTER — Encounter (HOSPITAL_COMMUNITY): Payer: Self-pay

## 2023-02-01 DIAGNOSIS — G43E09 Chronic migraine with aura, not intractable, without status migrainosus: Secondary | ICD-10-CM | POA: Diagnosis not present

## 2023-02-01 DIAGNOSIS — R519 Headache, unspecified: Secondary | ICD-10-CM | POA: Diagnosis present

## 2023-02-01 LAB — COMPREHENSIVE METABOLIC PANEL
ALT: 11 U/L (ref 0–44)
AST: 13 U/L — ABNORMAL LOW (ref 15–41)
Albumin: 4.4 g/dL (ref 3.5–5.0)
Alkaline Phosphatase: 62 U/L (ref 38–126)
Anion gap: 8 (ref 5–15)
BUN: 13 mg/dL (ref 6–20)
CO2: 24 mmol/L (ref 22–32)
Calcium: 9.4 mg/dL (ref 8.9–10.3)
Chloride: 104 mmol/L (ref 98–111)
Creatinine, Ser: 0.65 mg/dL (ref 0.44–1.00)
GFR, Estimated: >60 mL/min (ref >60)
Glucose, Bld: 112 mg/dL — ABNORMAL HIGH (ref 70–99)
Potassium: 3.6 mmol/L (ref 3.5–5.1)
Sodium: 136 mmol/L (ref 135–145)
Total Bilirubin: 0.4 mg/dL (ref 0.3–1.2)
Total Protein: 7.8 g/dL (ref 6.5–8.1)

## 2023-02-01 LAB — CBC WITH DIFFERENTIAL/PLATELET
Abs Immature Granulocytes: 0.01 10*3/uL (ref 0.00–0.07)
Basophils Absolute: 0 10*3/uL (ref 0.0–0.1)
Basophils Relative: 0 %
Eosinophils Absolute: 0 10*3/uL (ref 0.0–0.5)
Eosinophils Relative: 1 %
HCT: 36.8 % (ref 36.0–46.0)
Hemoglobin: 12.2 g/dL (ref 12.0–15.0)
Immature Granulocytes: 0 %
Lymphocytes Relative: 44 %
Lymphs Abs: 2.1 10*3/uL (ref 0.7–4.0)
MCH: 29.9 pg (ref 26.0–34.0)
MCHC: 33.2 g/dL (ref 30.0–36.0)
MCV: 90.2 fL (ref 80.0–100.0)
Monocytes Absolute: 0.4 10*3/uL (ref 0.1–1.0)
Monocytes Relative: 8 %
Neutro Abs: 2.2 10*3/uL (ref 1.7–7.7)
Neutrophils Relative %: 47 %
Platelets: 228 10*3/uL (ref 150–400)
RBC: 4.08 MIL/uL (ref 3.87–5.11)
RDW: 11.9 % (ref 11.5–15.5)
WBC: 4.7 10*3/uL (ref 4.0–10.5)
nRBC: 0 % (ref 0.0–0.2)

## 2023-02-01 MED ORDER — PROCHLORPERAZINE EDISYLATE 10 MG/2ML IJ SOLN
10.0000 mg | Freq: Once | INTRAMUSCULAR | Status: AC
  Administered 2023-02-01: 10 mg via INTRAVENOUS
  Filled 2023-02-01: qty 2

## 2023-02-01 MED ORDER — DIPHENHYDRAMINE HCL 50 MG/ML IJ SOLN
12.5000 mg | Freq: Once | INTRAMUSCULAR | Status: AC
  Administered 2023-02-01: 12.5 mg via INTRAVENOUS
  Filled 2023-02-01: qty 1

## 2023-02-01 MED ORDER — KETOROLAC TROMETHAMINE 15 MG/ML IJ SOLN
15.0000 mg | Freq: Once | INTRAMUSCULAR | Status: AC
  Administered 2023-02-01: 15 mg via INTRAVENOUS
  Filled 2023-02-01: qty 1

## 2023-02-01 MED ORDER — SUMATRIPTAN SUCCINATE 50 MG PO TABS: 50.0000 mg | ORAL_TABLET | Freq: Once | ORAL | 1 refills | Status: DC

## 2023-02-01 MED ORDER — SODIUM CHLORIDE 0.9 % IV BOLUS
1000.0000 mL | Freq: Once | INTRAVENOUS | Status: AC
  Administered 2023-02-01: 1000 mL via INTRAVENOUS

## 2023-02-01 NOTE — ED Notes (Addendum)
Pt stated she is having a headache for the past 2 weeks, pt stated she does get migraines in the past, 5/10 pain, bilaterial/temple area. Pt denies vomiting, nausea, blurred vision. Provider made aware.

## 2023-02-01 NOTE — Discharge Instructions (Signed)
You are seen in the department for a headache. Your lab workup was normal without any acute abnormalities noted on lab work or imaging. This is a chronic finding problem that responded well to a migraine cocktail received here.  I have refilled your prescription for sumatriptan which can pick up at your pharmacy this evening if you would like.  If you are concerned or symptoms return, please return to the emergency department for evaluation.  Otherwise, follow-up with your outpatient primary care provider or contact [place] neurology which I referred you to.

## 2023-02-01 NOTE — ED Provider Notes (Signed)
[place] EMERGENCY DEPARTMENT AT [facility] Provider Note   CSN: [id number] Arrival date & time: 02/01/23  1646     History Chief Complaint  Patient presents with   Headache    Julie [name] is a 27 y.o. female. Patient presents to the ED with complaints of a headache. She reports being evaluated at urgent care for headaches a few weeks ago and started on sumatriptan which she reports has been managing her symptoms well. Is now out of sumatriptan. Denies any significant vision changes, nausea, vomiting, or dizziness with headaches. Has had headaches somewhat chronically but has not previously been evaluated by neurology or another specialist.   Headache      Home Medications Prior to Admission medications   Medication Sig Start Date End Date Taking? Authorizing Provider  acetaminophen (TYLENOL) 500 MG tablet Take 1,000 mg by mouth every 6 (six) hours as needed for moderate pain.    [provider]  Blood Pressure Monitoring (BLOOD PRESSURE MONITOR AUTOMAT) DEVI 1 Device by Does not apply route daily. Automatic blood pressure cuff regular size. To monitor blood pressure regularly at home. ICD-10 code:Z34.90 03/25/21   Raelyn Mora, CNM  metroNIDAZOLE (FLAGYL) 500 MG tablet Take 1 tablet (500 mg total) by mouth 2 (two) times daily. 07/20/22   Lamptey, Britta Mccreedy, MD  olopatadine (PATANOL) 0.1 % ophthalmic solution Place 1 drop into both eyes 2 (two) times daily. 12/24/22   Rising, Lurena Joiner, PA-C  SUMAtriptan (IMITREX) 50 MG tablet Take 1 tablet (50 mg total) by mouth once for 1 dose. May repeat in 2 hours if headache persists or recurs. 02/01/23 02/01/23  Smitty Knudsen, PA-C      Allergies    Patient has no known allergies.    Review of Systems   Review of Systems  Neurological:  Positive for headaches.  All other systems reviewed and are negative.   Physical Exam Updated Vital Signs BP 111/76   Pulse (!) 105   Temp 99.3 F (37.4 C) (Oral)   Resp 18    Ht 5\' 5"  (1.651 m)   Wt 65.8 kg   SpO2 100%   BMI 24.13 kg/m  Physical Exam Vitals and nursing note reviewed.  Constitutional:      General: She is not in acute distress.    Appearance: She is well-developed.  HENT:     Head: Normocephalic and atraumatic.  Eyes:     Conjunctiva/sclera: Conjunctivae normal.  Cardiovascular:     Rate and Rhythm: Normal rate and regular rhythm.     Heart sounds: No murmur heard. Pulmonary:     Effort: Pulmonary effort is normal. No respiratory distress.     Breath sounds: Normal breath sounds.  Abdominal:     Palpations: Abdomen is soft.     Tenderness: There is no abdominal tenderness.  Musculoskeletal:        General: No swelling.     Cervical back: Neck supple.  Skin:    General: Skin is warm and dry.     Capillary Refill: Capillary refill takes less than 2 seconds.  Neurological:     Mental Status: She is alert.  Psychiatric:        Mood and Affect: Mood normal.     ED Results / Procedures / Treatments   Labs (all labs ordered are listed, but only abnormal results are displayed) Labs Reviewed  COMPREHENSIVE METABOLIC PANEL - Abnormal; Notable for the following components:      Result Value  Glucose, Bld 112 (*)    AST 13 (*)    All other components within normal limits  CBC WITH DIFFERENTIAL/PLATELET    EKG None  Radiology CT Head Wo Contrast  Result Date: 02/01/2023 CLINICAL DATA:  Headache for 3 weeks EXAM: CT HEAD WITHOUT CONTRAST TECHNIQUE: Contiguous axial images were obtained from the base of the skull through the vertex without intravenous contrast. RADIATION DOSE REDUCTION: This exam was performed according to the departmental dose-optimization program which includes automated exposure control, adjustment of the mA and/or kV according to patient size and/or use of iterative reconstruction technique. COMPARISON:  None Available. FINDINGS: Brain: No intracranial hemorrhage, mass effect, or evidence of acute infarct. No  hydrocephalus. No extra-axial fluid collection. Vascular: No hyperdense vessel or unexpected calcification. Skull: No fracture or focal lesion. Sinuses/Orbits: No acute finding. Paranasal sinuses and mastoid air cells are well aerated. Other: None. IMPRESSION: No acute intracranial process. Electronically Signed   By: Minerva Fester M.D.   On: 02/01/2023 17:57    Procedures Procedures   Medications Ordered in ED Medications  sodium chloride 0.9 % bolus 1,000 mL (1,000 mLs Intravenous New Bag/Given 02/01/23 1855)  prochlorperazine (COMPAZINE) injection 10 mg (10 mg Intravenous Given 02/01/23 1850)  diphenhydrAMINE (BENADRYL) injection 12.5 mg (12.5 mg Intravenous Given 02/01/23 1850)  ketorolac (TORADOL) 15 MG/ML injection 15 mg (15 mg Intravenous Given 02/01/23 1849)    ED Course/ Medical Decision Making/ A&P                           Medical Decision Making Amount and/or Complexity of Data Reviewed Labs: ordered. Radiology: ordered.  Risk Prescription drug management.   This patient presents to the ED for concern of headache. Differential diagnosis includes viral URI, tension headache, migraine, acute sinusitis   Lab Tests:  I Ordered, and personally interpreted labs.  The pertinent results include: CBC and CMP unremarkable   Imaging Studies ordered:  I ordered imaging studies including CT head I independently visualized and interpreted imaging which showed no acute intracranial abnormality I agree with the radiologist interpretation   Medicines ordered and prescription drug management:  I ordered medication including fluids, Toradol, Compazine, Benadryl for migraine cocktail Reevaluation of the patient after these medicines showed that the patient improved I have reviewed the patients home medicines and have made adjustments as needed   Problem List / ED Course:  Patient presents emergency department complaints of a headache.  She reports that this headache is somewhat  chronic in nature has been evaluated at urgent care few times for this.  She she was prescribed sumatriptan for these headaches which she reports has been able to manage them well but is out of this medication now.  He has not previously been evaluated by neurology or any other provider for her migraines or headaches.  Lab workup initiated which think was reassuring no signs of any electrolyte abnormality that would account for patient's headache.  CT imaging of head was also negative for any acute abnormality such as any masses or any bleeds.  Patient is likely experiencing chronic migraines that has not been able to resolve fully since the initial began.  Migraine cocktail was ordered and administered to patient.  She tolerated this course of medication without any significant complications or difficulty.  I reassessed the patient approximately an hour after the medications were administered and she reports that she has had no complete resolution of her migraine that she arrived with.  Given adequate relief of patient's headache, advised patient that she likely does have migraines and would benefit from outpatient neurology follow-up evaluation to ensure that she can be started or continue on the right medication for her to prevent migraines.  Patient is agreeable with this treatment plan verbalized understanding all return precautions.  All questions answered prior to patient discharge.  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

## 2023-02-01 NOTE — ED Triage Notes (Signed)
Pt to er, pt states that she is here for a headache, states that she has been having the pain for the past three weeks, states that she has been going to urgent care.  States that her headache seems to come and go.  States that tylenol helps with her pain a little bit.  States that nothing particular makes the headache start.

## 2023-04-01 ENCOUNTER — Inpatient Hospital Stay (HOSPITAL_COMMUNITY)
Admission: AD | Admit: 2023-04-01 | Discharge: 2023-04-01 | Disposition: A | Discharge status: Home / Self Care | Payer: BLUE CROSS/BLUE SHIELD | Attending: Obstetrics & Gynecology | Admitting: Obstetrics & Gynecology

## 2023-04-01 ENCOUNTER — Inpatient Hospital Stay (HOSPITAL_COMMUNITY): Payer: BLUE CROSS/BLUE SHIELD

## 2023-04-01 ENCOUNTER — Encounter (HOSPITAL_COMMUNITY): Payer: Self-pay | Admitting: Obstetrics and Gynecology

## 2023-04-01 DIAGNOSIS — R109 Unspecified abdominal pain: Secondary | ICD-10-CM | POA: Diagnosis not present

## 2023-04-01 DIAGNOSIS — O26891 Other specified pregnancy related conditions, first trimester: Secondary | ICD-10-CM | POA: Insufficient documentation

## 2023-04-01 DIAGNOSIS — T7491XA Unspecified adult maltreatment, confirmed, initial encounter: Secondary | ICD-10-CM

## 2023-04-01 DIAGNOSIS — Z3A01 Less than 8 weeks gestation of pregnancy: Secondary | ICD-10-CM | POA: Insufficient documentation

## 2023-04-01 DIAGNOSIS — S1191XA Laceration without foreign body of unspecified part of neck, initial encounter: Secondary | ICD-10-CM

## 2023-04-01 LAB — URINALYSIS, ROUTINE W REFLEX MICROSCOPIC
Bilirubin Urine: NEGATIVE
Glucose, UA: NEGATIVE mg/dL
Hgb urine dipstick: NEGATIVE
Ketones, ur: NEGATIVE mg/dL
Nitrite: POSITIVE — AB
Protein, ur: NEGATIVE mg/dL
Specific Gravity, Urine: 1.024 (ref 1.005–1.030)
pH: 5 (ref 5.0–8.0)

## 2023-04-01 LAB — WET PREP, GENITAL
Clue Cells Wet Prep HPF POC: NONE SEEN
Sperm: NONE SEEN
Trich, Wet Prep: NONE SEEN
WBC, Wet Prep HPF POC: >=10 — AB (ref <10)
Yeast Wet Prep HPF POC: NONE SEEN

## 2023-04-01 MED ORDER — ACETAMINOPHEN 500 MG PO TABS: 1000.0000 mg | ORAL_TABLET | Freq: Once | ORAL | Status: DC
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
## 2023-04-01 NOTE — MAU Note (Signed)
Pt says on 03-22-2023- UC in [place]- drew labs- positive preg.  Then had U/S - says [facility]UP [facility]- [facility]- appointment is 7-17 No VB Says has lower abd pain- started this am  Last sex- yesterday

## 2023-04-03 LAB — GC/CHLAMYDIA PROBE AMP (~~LOC~~) NOT AT ARMC
Chlamydia: NEGATIVE
Comment: NEGATIVE
Comment: NORMAL
Neisseria Gonorrhea: NEGATIVE

## 2023-05-16 ENCOUNTER — Telehealth (INDEPENDENT_AMBULATORY_CARE_PROVIDER_SITE_OTHER): Payer: BLUE CROSS/BLUE SHIELD

## 2023-05-16 DIAGNOSIS — Z3480 Encounter for supervision of other normal pregnancy, unspecified trimester: Secondary | ICD-10-CM | POA: Insufficient documentation

## 2023-05-16 MED ORDER — PRENATAL 27-1 MG PO TABS: 1.0000 | ORAL_TABLET | Freq: Every day | ORAL | 9 refills | Status: DC

## 2023-05-16 NOTE — Progress Notes (Signed)
New OB Intake  I connected with [name]  on 05/16/23 at  1:15 PM EDT by MyChart Video Visit and verified that I am speaking with the correct person using two identifiers. Nurse is located at [facility] and pt is located at home.  I discussed the limitations, risks, security and privacy concerns of performing an evaluation and management service by telephone and the availability of in person appointments. I also discussed with the patient that there may be a patient responsible charge related to this service. The patient expressed understanding and agreed to proceed.  I explained I am completing New OB Intake today. We discussed EDD of 11/13/2023, by Last Menstrual Period. Pt is G5P4004. I reviewed her allergies, medications and Medical/Surgical/OB history.    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 Discussed the nature of our practice with multiple providers including residents and students. Due to the size of the practice, the delivering provider may not be the same as those providing prenatal care.   Patient  unsure if  interested in water birth. Offered upcoming OB visit with CNM to discuss further.  MyChart/Babyscripts MyChart access verified. I explained pt will have some visits in office and some virtually. Babyscripts instructions given and order placed. Patient verifies receipt of registration text/e-mail. Account successfully created and app downloaded.  Blood Pressure Cuff/Weight Scale Patient has private insurance; instructed to purchase blood pressure cuff and bring to first prenatal appt. Explained after first prenatal appt pt will check weekly and document in Babyscripts.  Anatomy US Explained first scheduled [place] will be around 19 weeks. Anatomy US scheduled for 06/21/2023 at  9:15am.  Is patient a CenteringPregnancy candidate?  Accepted   Is patient a Mom+Baby Combined Care candidate?  Declined   If accepted, confirm patient does not intend to move from the area for at least 12 months, then notify Mom+Baby staff  Interested in [place]? If yes, send referral and doula dot phrase.   Is patient a candidate for Babyscripts Optimization? Yes  First visit review I reviewed new OB appt with patient. Explained pt will be seen by Albertine Grates, FNP at first visit. Discussed Avelina Laine genetic screening with patient.  Panorama and Horizon.. Routine prenatal labs is needed at new ob visit.   Last Pap Diagnosis  Date Value Ref Range Status  10/02/2019   Final   - Negative for intraepithelial lesion or malignancy (NILM)    B'Aisha T Pastor Sgro, CMA 05/16/2023  1:18 PM

## 2023-05-16 NOTE — Patient Instructions (Signed)
Safe Medications in Pregnancy   Acne:  Benzoyl Peroxide  Salicylic Acid   Backache/Headache:  Tylenol: 2 regular strength every 4 hours OR               2 Extra strength every 6 hours   Colds/Coughs/Allergies:  Benadryl (alcohol free) 25 mg every 6 hours as needed  Breath right strips  Claritin  Cepacol throat lozenges  Chloraseptic throat spray  Cold-Eeze- up to three times per day  Cough drops, alcohol free  Flonase (by prescription only)  Guaifenesin  Mucinex  Robitussin DM (plain only, alcohol free)  Saline nasal spray/drops  Sudafed (pseudoephedrine) & Actifed * use only after [redacted] weeks gestation and if you do not have high blood pressure  Tylenol  Vicks Vaporub  Zinc lozenges  Zyrtec   Constipation:  Colace  Ducolax suppositories  Fleet enema  Glycerin suppositories  Metamucil  Milk of magnesia  Miralax  Senokot  Smooth move tea   Diarrhea:  Kaopectate  Imodium A-D   *NO pepto Bismol   Hemorrhoids:  Anusol  Anusol HC  Preparation H  Tucks   Indigestion:  Tums  Maalox  Mylanta  Zantac  Pepcid   Insomnia:  Benadryl (alcohol free) 25mg every 6 hours as needed  Tylenol PM  Unisom, no Gelcaps   Leg Cramps:  Tums  MagGel   Nausea/Vomiting:  Bonine  Dramamine  Emetrol  Ginger extract  Sea bands  Meclizine  Nausea medication to take during pregnancy:  Unisom (doxylamine succinate 25 mg tablets) Take one tablet daily at bedtime. If symptoms are not adequately controlled, the dose can be increased to a maximum recommended dose of two tablets daily (1/2 tablet in the morning, 1/2 tablet mid-afternoon and one at bedtime).  Vitamin B6 100mg tablets. Take one tablet twice a day (up to 200 mg per day).   Skin Rashes:  Aveeno products  Benadryl cream or 25mg every 6 hours as needed  Calamine Lotion  1% cortisone cream   Yeast infection:  Gyne-lotrimin 7  Monistat 7    **If taking multiple medications, please check labels to avoid  duplicating the same active ingredients  **take medication as directed on the label  ** Do not exceed 4000 mg of tylenol in 24 hours  **Do not take medications that contain aspirin or ibuprofen             Considering Waterbirth? Guide for patients at [facility]) Why consider waterbirth? Gentle birth for babies  Less pain medicine used in labor  May allow for passive descent/less pushing  May reduce perineal tears  More mobility and instinctive maternal position changes  Increased maternal relaxation   Is waterbirth safe? What are the risks of infection, drowning or other complications? Infection:  Very low risk (3.7 % for tub vs 4.8% for bed)  7 in 8000 waterbirths with documented infection  Poorly cleaned equipment most common cause  Slightly lower group B strep transmission rate  Drowning  Maternal:  Very low risk  Related to seizures or fainting  Newborn:  Very low risk. No evidence of increased risk of respiratory problems in multiple large studies  Physiological protection from breathing under water  Avoid underwater birth if there are any fetal complications  Once baby's head is out of the water, keep it out.  Birth complication  Some reports of cord trauma, but risk decreased by bringing baby to surface gradually  No evidence of increased risk of shoulder dystocia.   Mothers can usually change positions faster in water than in a bed, possibly aiding the maneuvers to free the shoulder.   There are 2 things you MUST do to have a waterbirth with [facility]: Attend a waterbirth class at [facility]   3rd Wednesday of every month from 7-9 pm (virtual during COVID) Free Register online at [email or web address] or [email or web address] or by calling [phone] Bring us the certificate from the class to your prenatal appointment or send via MyChart Meet with a midwife at 36 weeks* to see if you can still plan a  waterbirth and to sign the consent.   *We also recommend that you schedule as many of your prenatal visits with a midwife as possible.    Helpful information: You may want to bring a bathing suit top to the hospital to wear during labor but this is optional.  All other supplies are provided by the hospital. Please arrive at the hospital with signs of active labor, and do not wait at home until late in labor. It takes 45 min- 1 hour for fetal monitoring, and check in to your room to take place, plus transport and filling of the waterbirth tub.    Things that would prevent you from having a waterbirth: Premature, <37wks  Previous cesarean birth  Presence of thick meconium-stained fluid  Multiple gestation (Twins, triplets, etc.)  Uncontrolled diabetes or gestational diabetes requiring medication  Hypertension diagnosed in pregnancy or preexisting hypertension (gestational hypertension, preeclampsia, or chronic hypertension) Fetal growth restriction (your baby measures less than 10th percentile on ultrasound) Heavy vaginal bleeding  Non-reassuring fetal heart rate  Active infection (MRSA, etc.). Group B Strep is NOT a contraindication for waterbirth.  If your labor has to be induced and induction method requires continuous monitoring of the baby's heart rate  Other risks/issues identified by your obstetrical provider   Please remember that birth is unpredictable. Under certain unforeseeable circumstances your provider may advise against giving birth in the tub. These decisions will be made on a case-by-case basis and with the safety of you and your baby as our highest priority.     

## 2023-05-17 ENCOUNTER — Encounter (HOSPITAL_COMMUNITY): Payer: Self-pay | Admitting: Obstetrics and Gynecology

## 2023-05-17 ENCOUNTER — Inpatient Hospital Stay (HOSPITAL_COMMUNITY)
Admission: AD | Admit: 2023-05-17 | Discharge: 2023-05-17 | Disposition: A | Discharge status: Home / Self Care | Payer: BLUE CROSS/BLUE SHIELD | Attending: Obstetrics & Gynecology | Admitting: Obstetrics & Gynecology

## 2023-05-17 DIAGNOSIS — O99612 Diseases of the digestive system complicating pregnancy, second trimester: Secondary | ICD-10-CM | POA: Diagnosis not present

## 2023-05-17 DIAGNOSIS — R1030 Lower abdominal pain, unspecified: Secondary | ICD-10-CM

## 2023-05-17 DIAGNOSIS — Z3A14 14 weeks gestation of pregnancy: Secondary | ICD-10-CM | POA: Diagnosis not present

## 2023-05-17 DIAGNOSIS — O26892 Other specified pregnancy related conditions, second trimester: Secondary | ICD-10-CM | POA: Insufficient documentation

## 2023-05-17 DIAGNOSIS — O26899 Other specified pregnancy related conditions, unspecified trimester: Secondary | ICD-10-CM

## 2023-05-17 LAB — URINALYSIS, ROUTINE W REFLEX MICROSCOPIC
Bilirubin Urine: NEGATIVE
Glucose, UA: >=500 mg/dL — AB
Hgb urine dipstick: NEGATIVE
Ketones, ur: 5 mg/dL — AB
Leukocytes,Ua: NEGATIVE
Nitrite: NEGATIVE
Protein, ur: NEGATIVE mg/dL
Specific Gravity, Urine: 1.026 (ref 1.005–1.030)
pH: 5 (ref 5.0–8.0)

## 2023-05-17 MED ORDER — ACETAMINOPHEN 325 MG PO TABS
650.0000 mg | ORAL_TABLET | Freq: Once | ORAL | Status: AC
  Administered 2023-05-17: 650 mg via ORAL
  Filled 2023-05-17: qty 2

## 2023-05-17 NOTE — MAU Provider Note (Signed)
History     CSN: [id number]  Arrival date and time: 05/17/23 5284   Event Date/Time   First Provider Initiated Contact with Patient 05/17/23 [phone]      Chief Complaint  Patient presents with   Abdominal Pain    Julie [name] is a 27 y.o. [id number] at [redacted]w[redacted]d who receives care at [facility].  She presents today for abdominal pain. She states she has been experiencing sharp pain for the past 2 days.  She reports the pain is worsened with standing and improved with laying down. She reports she stands while working.  She denies vaginal bleeding or discharge. She rates the pain a 6/10.   OB History     Gravida  5   Para  4   Term  4   Preterm      AB      Living  4      SAB      IAB      Ectopic      Multiple  0   Live Births  4           Past Medical History:  Diagnosis Date   Anemia    GERD (gastroesophageal reflux disease)    Supervision of other normal pregnancy, antepartum 09/16/2019    Nursing Staff Provider Office Location  [facility] Dating  Ultrasound Language  English Anatomy US  Normal, isolated EIF  Flu Vaccine  Declined Genetic Screen  NIPS:   Low risk girl AFP:   Screen negative  TDaP vaccine   Declined Hgb A1C or  GTT Early  Third trimester    Ref Range & Units 1 d ago  Glucose, Fasting 65 - 91 mg/dL 76   Glucose, 1 hour 65 - 179 mg/dL 536   Glucose, 2 hour 65 - 152 m   SVD (spontaneous vaginal delivery) 10/04/2021   Trichomonas infection     Past Surgical History:  Procedure Laterality Date   CHOLECYSTECTOMY N/A 01/09/2018   Procedure: LAPAROSCOPIC CHOLECYSTECTOMY WITH INTRAOPERATIVE CHOLANGIOGRAM;  Surgeon: Claud Kelp, MD;  Location: [facility];  Service: General;  Laterality: N/A;    Family History  Problem Relation Age of Onset   Heart disease Mother    Diabetes Mother    Pancreatic cancer Father    Cancer Maternal Grandfather     Social History   Tobacco Use   Smoking status: Never   Smokeless tobacco: Never   Vaping Use   Vaping status: Never Used  Substance Use Topics   Alcohol use: No   Drug use: No    Allergies:  Allergies  Allergen Reactions   Citric Acid Rash    Medications Prior to Admission  Medication Sig Dispense Refill Last Dose   acetaminophen (TYLENOL) 500 MG tablet Take 1,000 mg by mouth every 6 (six) hours as needed for moderate pain. (Patient not taking: Reported on 05/16/2023)      Blood Pressure Monitoring (BLOOD PRESSURE MONITOR AUTOMAT) DEVI 1 Device by Does not apply route daily. Automatic blood pressure cuff regular size. To monitor blood pressure regularly at home. ICD-10 code:Z34.90 (Patient not taking: Reported on 05/16/2023) 1 each 0    cephALEXin (KEFLEX) 500 MG capsule Take 500 mg by mouth 4 (four) times daily. (Patient not taking: Reported on 05/16/2023)      meloxicam (MOBIC) 15 MG tablet Take by mouth. (Patient not taking: Reported on 05/16/2023)      metroNIDAZOLE (FLAGYL) 500 MG tablet Take 1 tablet (500 mg total) by  mouth 2 (two) times daily. (Patient not taking: Reported on 05/16/2023) 14 tablet 0    olopatadine (PATANOL) 0.1 % ophthalmic solution Place 1 drop into both eyes 2 (two) times daily. (Patient not taking: Reported on 05/16/2023) 5 mL 12    Prenatal 27-1 MG TABS Take 1 tablet by mouth daily. 30 tablet 9    SUMAtriptan (IMITREX) 50 MG tablet Take by mouth. (Patient not taking: Reported on 05/16/2023)       Review of Systems  Gastrointestinal:  Positive for abdominal pain, diarrhea (Watery, Yesterday 2x) and vomiting. Negative for constipation and nausea.  Genitourinary:  Negative for difficulty urinating, dysuria, vaginal bleeding and vaginal discharge.   Physical Exam   Blood pressure 109/64, pulse (!) 111, temperature 98.3 F (36.8 C), resp. rate 18, height 5\' 5"  (1.651 m), weight 64.4 kg, last menstrual period 02/06/2023, not currently breastfeeding.  Physical Exam Vitals reviewed.  Constitutional:      General: She is not in acute distress.     Appearance: She is well-developed. She is not ill-appearing.  HENT:     Head: Normocephalic and atraumatic.  Eyes:     Conjunctiva/sclera: Conjunctivae normal.  Cardiovascular:     Rate and Rhythm: Tachycardia present.  Pulmonary:     Effort: Pulmonary effort is normal. No respiratory distress.  Abdominal:     Palpations: Abdomen is soft.     Tenderness: There is no abdominal tenderness.  Musculoskeletal:        General: Normal range of motion.     Cervical back: Normal range of motion.  Skin:    General: Skin is warm and dry.  Neurological:     Mental Status: She is alert and oriented to person, place, and time.  Psychiatric:        Mood and Affect: Mood normal.        Behavior: Behavior normal.     MAU Course  Procedures Results for orders placed or performed during the hospital encounter of 05/17/23 (from the past 24 hour(s))  Urinalysis, Routine w reflex microscopic -Urine, Clean Catch     Status: Abnormal   Collection Time: 05/17/23 10:02 AM  Result Value Ref Range   Color, Urine YELLOW YELLOW   APPearance HAZY (A) CLEAR   Specific Gravity, Urine 1.026 1.005 - 1.030   pH 5.0 5.0 - 8.0   Glucose, UA >=500 (A) NEGATIVE mg/dL   Hgb urine dipstick NEGATIVE NEGATIVE   Bilirubin Urine NEGATIVE NEGATIVE   Ketones, ur 5 (A) NEGATIVE mg/dL   Protein, ur NEGATIVE NEGATIVE mg/dL   Nitrite NEGATIVE NEGATIVE   Leukocytes,Ua NEGATIVE NEGATIVE   RBC / HPF 0-5 0 - 5 RBC/hpf   WBC, UA 0-5 0 - 5 WBC/hpf   Bacteria, UA FEW (A) NONE SEEN   Squamous Epithelial / HPF 11-20 0 - 5 /HPF   Mucus PRESENT     MDM Labs; UA Analgesic  Assessment and Plan  27 year old, G5P4004  SIUP at 14.2 weeks Abdominal Pain  -Reviewed POC with patient. -Exam performed and findings discussed.  -Reassured normal to have some cramping with prolonged activity. -Encouraged proper hydration and rest periods.  -Patient offered and accepts pain medication. Will give tylenol. -UA sent.  Cherre Robins 05/17/2023, 9:53 AM   Reassessment (10:49 AM) -UA returns as above. -Patient reports eating taco and drinking mountain dew prior to arrival.  -Will send for culture. -Patient reports pain 0/10. -Precautions reviewed. -Instructed to monitor. -Work note given. -Keep next appt  as scheduled. -Encouraged to call primary office or return to MAU if symptoms worsen or with the onset of new symptoms. -Discharged to home in improved condition.  Cherre Robins MSN, CNM Advanced Practice Provider, [facility]

## 2023-05-17 NOTE — MAU Note (Signed)
.  Julie [name] is a 27 y.o. at [redacted]w[redacted]d here in MAU reporting: abd pain/cramping that started 2 days ago. Denies any vag bleeding or discharge.  LMP:  Onset of complaint: 2 days Pain score: 7 Vitals:   05/17/23 0948  BP: 109/64  Pulse: (!) 111  Resp: 18  Temp: 98.3 F (36.8 C)     FHT:162 Lab orders placed from triage:  u.a

## 2023-05-18 ENCOUNTER — Encounter: Payer: Self-pay | Admitting: *Deleted

## 2023-05-18 LAB — CULTURE, OB URINE: Culture: NO GROWTH

## 2023-05-22 ENCOUNTER — Ambulatory Visit (INDEPENDENT_AMBULATORY_CARE_PROVIDER_SITE_OTHER): Payer: BLUE CROSS/BLUE SHIELD | Admitting: Obstetrics and Gynecology

## 2023-05-22 ENCOUNTER — Other Ambulatory Visit (HOSPITAL_COMMUNITY)
Admission: RE | Admit: 2023-05-22 | Discharge: 2023-05-22 | Disposition: A | Discharge status: Home / Self Care | Payer: BLUE CROSS/BLUE SHIELD | Source: Ambulatory Visit | Attending: Obstetrics and Gynecology | Admitting: Obstetrics and Gynecology

## 2023-05-22 VITALS — BP 97/64 | HR 105 | Wt 142.0 lb

## 2023-05-22 DIAGNOSIS — Z3482 Encounter for supervision of other normal pregnancy, second trimester: Secondary | ICD-10-CM

## 2023-05-22 DIAGNOSIS — Z3480 Encounter for supervision of other normal pregnancy, unspecified trimester: Secondary | ICD-10-CM | POA: Insufficient documentation

## 2023-05-22 DIAGNOSIS — Z3A15 15 weeks gestation of pregnancy: Secondary | ICD-10-CM | POA: Diagnosis not present

## 2023-05-22 DIAGNOSIS — Z124 Encounter for screening for malignant neoplasm of cervix: Secondary | ICD-10-CM | POA: Diagnosis not present

## 2023-05-22 MED ORDER — ASPIRIN 81 MG PO TBEC
81.0000 mg | DELAYED_RELEASE_TABLET | Freq: Every day | ORAL | 2 refills | Status: AC
Start: 2023-05-22 — End: ?

## 2023-05-22 NOTE — Progress Notes (Signed)
Pregnancy risk screen form done. Patient has BCBS/ Medicaid . Advised to buy bp cuff if she can.  Julie [name]

## 2023-05-22 NOTE — Progress Notes (Signed)
INITIAL PRENATAL VISIT  Subjective:   Julie [name] is being seen today for her first obstetrical visit. She is at [redacted]w[redacted]d gestation by LMP. Her obstetrical history is significant for  none . Patient does intend to breast feed. Pregnancy history fully reviewed.  Patient reports no complaints.  Indications for ASA therapy (per uptodate) One of the following: Previous pregnancy with preeclampsia, especially early onset and with an adverse outcome No Multifetal gestation No Chronic hypertension No Type 1 or 2 diabetes mellitus No Chronic kidney disease No Autoimmune disease (antiphospholipid syndrome, systemic lupus erythematosus) No  Two or more of the following: Nulliparity No Obesity (body mass index >30 kg/m2) No Family history of preeclampsia in mother or sister Yes Age ?35 years No Sociodemographic characteristics (African American race, low socioeconomic level) Yes Personal risk factors (eg, previous pregnancy with low birth weight or small for gestational age infant, previous adverse pregnancy outcome [eg, stillbirth], interval >10 years between pregnancies) No  Indications for early GDM screening  First-degree relative with diabetes Yes BMI >30kg/m2 No Age > 25 Yes Previous birth of an infant weighing ?4000 g No Gestational diabetes mellitus in a previous pregnancy No Glycated hemoglobin ?5.7 percent (39 mmol/mol), impaired glucose tolerance, or impaired fasting glucose on previous testing No High-risk race/ethnicity (eg, African American, Latino, Native American, Asian American, Pacific Islander) Yes Previous stillbirth of unknown cause No Maternal birthweight > 9 lbs No History of cardiovascular disease No Hypertension or on therapy for hypertension No High-density lipoprotein cholesterol level <35 mg/dL (5.28 mmol/L) and/or a triglyceride level >250 mg/dL (4.13 mmol/L) No Polycystic ovary syndrome No Physical inactivity No Other clinical condition associated with  insulin resistance (eg, severe obesity, acanthosis nigricans) No Current use of glucocorticoids No   Early screening tests: FBS, A1C, Random CBG, glucose challenge  Objective:    Obstetric History OB History  Gravida Para Term Preterm AB Living  5 4 4     4   SAB IAB Ectopic Multiple Live Births        0 4    # Outcome Date GA Lbr Len/2nd Weight Sex Type Anes PTL Lv  5 Current           4 Term 10/04/21 [redacted]w[redacted]d 07:02 / 00:01 6 lb 6.3 oz (2.9 kg) F Vag-Spont EPI  LIV  3 Term 02/27/20 [redacted]w[redacted]d / 00:12 6 lb 10.2 oz (3.011 kg) F Vag-Spont EPI  LIV  2 Term 04/21/19 [redacted]w[redacted]d 11:26 / 00:25 7 lb 0.9 oz (3.201 kg) F Vag-Spont EPI  LIV     Birth Comments: WNL  1 Term 10/12/17 [redacted]w[redacted]d / 01:06 6 lb 1.4 oz (2.761 kg) F Vag-Spont EPI  LIV     Birth Comments: WNL    Past Medical History:  Diagnosis Date   Anemia    GERD (gastroesophageal reflux disease)    Supervision of other normal pregnancy, antepartum 09/16/2019    Nursing Staff Provider Office Location  [place] Dating  Ultrasound Language  English Anatomy US  Normal, isolated EIF  Flu Vaccine  Declined Genetic Screen  NIPS:   Low risk girl AFP:   Screen negative  TDaP vaccine   Declined Hgb A1C or  GTT Early  Third trimester    Ref Range & Units 1 d ago  Glucose, Fasting 65 - 91 mg/dL 76   Glucose, 1 hour 65 - 179 mg/dL 244   Glucose, 2 hour 65 - 152 m   SVD (spontaneous vaginal delivery) 10/04/2021   Trichomonas infection  Past Surgical History:  Procedure Laterality Date   CHOLECYSTECTOMY N/A 01/09/2018   Procedure: LAPAROSCOPIC CHOLECYSTECTOMY WITH INTRAOPERATIVE CHOLANGIOGRAM;  Surgeon: Claud Kelp, MD;  Location: [facility];  Service: General;  Laterality: N/A;    Current Outpatient Medications on File Prior to Visit  Medication Sig Dispense Refill   acetaminophen (TYLENOL) 500 MG tablet Take 1,000 mg by mouth every 6 (six) hours as needed for moderate pain.     Prenatal 27-1 MG TABS Take 1 tablet by mouth daily. 30  tablet 9   Blood Pressure Monitoring (BLOOD PRESSURE MONITOR AUTOMAT) DEVI 1 Device by Does not apply route daily. Automatic blood pressure cuff regular size. To monitor blood pressure regularly at home. ICD-10 code:Z34.90 (Patient not taking: Reported on 05/16/2023) 1 each 0   No current facility-administered medications on file prior to visit.    Allergies  Allergen Reactions   Citric Acid Rash    Social History:  reports that she has never smoked. She has never used smokeless tobacco. She reports that she does not drink alcohol and does not use drugs.  Family History  Problem Relation Age of Onset   Heart disease Mother    Diabetes Mother    Pancreatic cancer Father    Cancer Maternal Grandfather     The following portions of the patient's history were reviewed and updated as appropriate: allergies, current medications, past family history, past medical history, past social history, past surgical history and problem list.  Review of Systems Review of Systems  All other systems reviewed and are negative.    Physical Exam:  BP 97/64   Pulse (!) 105   Wt 142 lb (64.4 kg)   LMP 02/06/2023   BMI 23.63 kg/m  CONSTITUTIONAL: Well-developed, well-nourished female in no acute distress.  HENT:  Normocephalic, atraumatic.   EYES: Conjunctivae normal.  NECK: Normal range of motion SKIN: Skin is warm and dry MUSCULOSKELETAL: Normal range of motion NEUROLOGIC: Alert and oriented  PSYCHIATRIC: Normal mood and affect. Normal behavior. Normal judgment and thought content. CARDIOVASCULAR: Normal heart rate noted RESPIRATORY: normal effort ABDOMEN: Soft PELVIC: Normal appearing external genitalia; normal appearing vaginal mucosa and cervix.  No abnormal discharge noted.  Pap smear obtained.   Chaperone present for exam  Fetal Heart Rate (bpm): 160   Movement: Present       Assessment:    Pregnancy: G5P4004  1. Supervision of other normal pregnancy, antepartum 2. [redacted] weeks  gestation of pregnancy BP and FHR normal Discussed recommendation for aspirin during pregnancy, rx sent to pharmacy Will be joining centering group  Initial labs drawn. Prenatal vitamins. Problem list reviewed and updated. Reviewed in detail the nature of the practice with collaborative care between  Genetic screening discussed: NIPS/First trimester screen/Quad/AFP ordered. Role of ultrasound in pregnancy discussed; Anatomy US: ordered. - PANORAMA PRENATAL TEST - CBC/D/Plt+RPR+Rh+ABO+RubIgG... - Hemoglobin A1c - aspirin EC 81 MG tablet; Take 1 tablet (81 mg total) by mouth daily. Start taking when you are [redacted] weeks pregnant for rest of pregnancy for prevention of preeclampsia  Dispense: 300 tablet; Refill: 2  3. Cervical cancer screening  - Cytology - PAP( [facility])  Follow up in 4 weeks. Discussed clinic routines, schedule of care and testing, genetic screening options, involvement of students and residents under the direct supervision of APPs and doctors and presence of female providers. Pt verbalized understanding.  Future Appointments  Date Time Provider Department Center  06/14/2023  9:00 AM CENTERING PROVIDER [facility]  06/21/2023  9:15 AM [facility] NURSE [facility]  06/21/2023  9:30 AM [facility] US1 [facility]  07/12/2023  9:00 AM CENTERING PROVIDER [facility]  08/09/2023  9:00 AM CENTERING PROVIDER [facility]  08/23/2023  9:00 AM CENTERING PROVIDER [facility]  09/06/2023  9:00 AM CENTERING PROVIDER [facility]  09/20/2023  9:00 AM CENTERING PROVIDER [facility]  10/11/2023  2:00 PM CENTERING PROVIDER [facility]  10/18/2023  9:00 AM CENTERING PROVIDER [facility]  11/01/2023  9:00 AM CENTERING PROVIDER [facility]  11/15/2023  9:00 AM CENTERING PROVIDER [facility]   Albertine Grates, FNP

## 2023-05-23 ENCOUNTER — Encounter: Payer: Self-pay | Admitting: *Deleted

## 2023-05-24 LAB — CYTOLOGY - PAP: Diagnosis: NEGATIVE

## 2023-05-29 ENCOUNTER — Encounter: Payer: Self-pay | Admitting: *Deleted

## 2023-05-29 LAB — CBC/D/PLT+RPR+RH+ABO+RUBIGG...
Basophils Absolute: 0 10*3/uL (ref 0.0–0.2)
Basos: 0 %
EOS (ABSOLUTE): 0 10*3/uL (ref 0.0–0.4)
Eos: 0 %
HCV Ab: NONREACTIVE
HIV Screen 4th Generation wRfx: NONREACTIVE
Hematocrit: 33.3 % — ABNORMAL LOW (ref 34.0–46.6)
Hemoglobin: 11.6 g/dL (ref 11.1–15.9)
Hepatitis B Surface Ag: NEGATIVE
Immature Grans (Abs): 0 10*3/uL (ref 0.0–0.1)
Immature Granulocytes: 0 %
Lymphocytes Absolute: 1.6 10*3/uL (ref 0.7–3.1)
Lymphs: 28 %
MCH: 30.4 pg (ref 26.6–33.0)
MCHC: 34.8 g/dL (ref 31.5–35.7)
MCV: 87 fL (ref 79–97)
Monocytes Absolute: 0.5 10*3/uL (ref 0.1–0.9)
Monocytes: 9 %
Neutrophils Absolute: 3.6 10*3/uL (ref 1.4–7.0)
Neutrophils: 63 %
Platelets: 283 10*3/uL (ref 150–450)
RBC: 3.82 x10E6/uL (ref 3.77–5.28)
RDW: 12.9 % (ref 11.7–15.4)
RPR Ser Ql: NONREACTIVE
Rh Factor: POSITIVE
Rubella Antibodies, IGG: 2.81 {index} (ref >0.99)
WBC: 5.8 10*3/uL (ref 3.4–10.8)

## 2023-05-29 LAB — HCV INTERPRETATION

## 2023-05-29 LAB — HEMOGLOBIN A1C
Est. average glucose Bld gHb Est-mCnc: 105 mg/dL
Hgb A1c MFr Bld: 5.3 % (ref 4.8–5.6)

## 2023-05-29 LAB — AB SCR+ANTIBODY ID: Antibody Screen: POSITIVE — AB

## 2023-05-30 LAB — PANORAMA PRENATAL TEST FULL PANEL:PANORAMA TEST PLUS 5 ADDITIONAL MICRODELETIONS: FETAL FRACTION: 13.5

## 2023-06-14 ENCOUNTER — Encounter: Payer: Self-pay | Admitting: *Deleted

## 2023-06-19 DIAGNOSIS — O208 Other hemorrhage in early pregnancy: Secondary | ICD-10-CM | POA: Insufficient documentation

## 2023-06-21 ENCOUNTER — Encounter: Payer: Self-pay | Admitting: *Deleted

## 2023-06-21 ENCOUNTER — Ambulatory Visit: Payer: BLUE CROSS/BLUE SHIELD | Admitting: *Deleted

## 2023-06-21 ENCOUNTER — Ambulatory Visit: Payer: BLUE CROSS/BLUE SHIELD | Attending: Obstetrics and Gynecology

## 2023-06-21 VITALS — BP 116/57 | HR 108

## 2023-06-21 DIAGNOSIS — Z3A19 19 weeks gestation of pregnancy: Secondary | ICD-10-CM | POA: Insufficient documentation

## 2023-06-21 DIAGNOSIS — O468X2 Other antepartum hemorrhage, second trimester: Secondary | ICD-10-CM | POA: Insufficient documentation

## 2023-06-21 DIAGNOSIS — O208 Other hemorrhage in early pregnancy: Secondary | ICD-10-CM | POA: Insufficient documentation

## 2023-06-21 DIAGNOSIS — Z3480 Encounter for supervision of other normal pregnancy, unspecified trimester: Secondary | ICD-10-CM | POA: Diagnosis not present

## 2023-06-21 DIAGNOSIS — O361920 Maternal care for other isoimmunization, second trimester, not applicable or unspecified: Secondary | ICD-10-CM | POA: Diagnosis present

## 2023-06-22 ENCOUNTER — Encounter: Payer: BLUE CROSS/BLUE SHIELD | Admitting: Obstetrics & Gynecology

## 2023-07-04 ENCOUNTER — Encounter: Payer: Self-pay | Admitting: Obstetrics and Gynecology

## 2023-07-04 ENCOUNTER — Other Ambulatory Visit: Payer: Self-pay

## 2023-07-04 ENCOUNTER — Ambulatory Visit (INDEPENDENT_AMBULATORY_CARE_PROVIDER_SITE_OTHER): Payer: BLUE CROSS/BLUE SHIELD | Admitting: Obstetrics and Gynecology

## 2023-07-04 VITALS — BP 121/73 | HR 109 | Wt 152.0 lb

## 2023-07-04 DIAGNOSIS — Z3A21 21 weeks gestation of pregnancy: Secondary | ICD-10-CM

## 2023-07-04 DIAGNOSIS — Z3480 Encounter for supervision of other normal pregnancy, unspecified trimester: Secondary | ICD-10-CM

## 2023-07-04 DIAGNOSIS — O361920 Maternal care for other isoimmunization, second trimester, not applicable or unspecified: Secondary | ICD-10-CM

## 2023-07-04 NOTE — Progress Notes (Signed)
Subjective:  Julie [name] is a 27 y.o. [id number] at [redacted]w[redacted]d being seen today for ongoing prenatal care.  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  Patient reports no complaints.  Contractions: Not present. Vag. Bleeding: None.  Movement: Present. Denies leaking of fluid.   The following portions of the patient's history were reviewed and updated as appropriate: allergies, current medications, past family history, past medical history, past social history, past surgical history and problem list. Problem list updated.  Objective:   Vitals:   07/04/23 1601  BP: 121/73  Pulse: (!) 109  Weight: 152 lb (68.9 kg)    Fetal Status: Fetal Heart Rate (bpm): 162   Movement: Present     General:  Alert, oriented and cooperative. Patient is in no acute distress.  Skin: Skin is warm and dry. No rash noted.   Cardiovascular: Normal heart rate noted  Respiratory: Normal respiratory effort, no problems with respiration noted  Abdomen: Soft, gravid, appropriate for gestational age. Pain/Pressure: Present (Pressure and some pain lower ABD)     Pelvic:  Cervical exam deferred        Extremities: Normal range of motion.  Edema: None  Mental Status: Normal mood and affect. Normal behavior. Normal judgment and thought content.   Urinalysis:      Assessment and Plan:  Pregnancy: G5P4004 at [redacted]w[redacted]d  1. Supervision of other normal pregnancy, antepartum Stable AFP today  2. 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 Please refer to After Visit Summary for other  counseling recommendations.  Return in about 4 weeks (around 08/01/2023) for OB visit, face to face, any provider.   Hermina Staggers, MD

## 2023-07-06 LAB — AFP, SERUM, OPEN SPINA BIFIDA
AFP MoM: 1.64
AFP Value: 113.5 ng/mL
Gest. Age on Collection Date: 21 wk
Maternal Age At EDD: 25.5 a
OSBR Risk 1 IN: 3791
Test Results:: NEGATIVE
Weight: 152 [lb_av]

## 2023-07-12 ENCOUNTER — Encounter: Payer: Self-pay | Admitting: *Deleted

## 2023-07-12 ENCOUNTER — Inpatient Hospital Stay (HOSPITAL_COMMUNITY)
Admission: AD | Admit: 2023-07-12 | Discharge: 2023-07-12 | Discharge status: Against Medical Advice | Payer: BLUE CROSS/BLUE SHIELD | Attending: Obstetrics and Gynecology | Admitting: Obstetrics and Gynecology

## 2023-07-12 ENCOUNTER — Encounter: Payer: BLUE CROSS/BLUE SHIELD | Admitting: Family Medicine

## 2023-07-12 DIAGNOSIS — Z5321 Procedure and treatment not carried out due to patient leaving prior to being seen by health care provider: Secondary | ICD-10-CM | POA: Insufficient documentation

## 2023-07-12 DIAGNOSIS — Z3480 Encounter for supervision of other normal pregnancy, unspecified trimester: Secondary | ICD-10-CM

## 2023-07-12 LAB — URINALYSIS, ROUTINE W REFLEX MICROSCOPIC
Bilirubin Urine: NEGATIVE
Glucose, UA: 50 mg/dL — AB
Hgb urine dipstick: NEGATIVE
Ketones, ur: 5 mg/dL — AB
Nitrite: NEGATIVE
Protein, ur: NEGATIVE mg/dL
Specific Gravity, Urine: 1.026 (ref 1.005–1.030)
pH: 5 (ref 5.0–8.0)

## 2023-07-12 NOTE — MAU Note (Signed)
Julie [name] is a 27 y.o. at [redacted]w[redacted]d here in MAU reporting: been having like pelvic pain and abd pain, only when standing up. Works 12 hr shifts.  Sharp and achy, goes around to the back. No bleeding or d/c. Feels movement. Onset of complaint: 3 days Pain score: mild Vitals:   07/12/23 1637  BP: 108/60  Pulse: (!) 108  Resp: 16  Temp: 98.3 F (36.8 C)  SpO2: 100%     FHT:160 Lab orders placed from triage:  urine

## 2023-07-12 NOTE — MAU Provider Note (Signed)
Called for Ms Grounds out in the MAU lobby, there was no answer.  Will try again later.   Jaynie Collins, MD 07/12/2023  9:04 PM     Called again for Ms Potenza out in the MAU lobby, there was no answer.  Will try again later.   Jaynie Collins, MD 07/12/2023  9:15 PM    Called again for Ms Peragine out in the MAU lobby, there was no answer.  This was the third.  She was marked as  having left AMA.   Jaynie Collins, MD 07/12/2023  9:41 PM

## 2023-07-12 NOTE — Progress Notes (Deleted)
   PRENATAL VISIT NOTE  Subjective:  Julie [name] is a 27 y.o. Z6X0960 at [redacted]w[redacted]d being seen today for ongoing prenatal care.  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  Patient reports {sx:14538}.   .  .   . Denies leaking of fluid.   The following portions of the patient's history were reviewed and updated as appropriate: allergies, current medications, past family history, past medical history, past social history, past surgical history and problem list.   Objective:  There were no vitals filed for this visit.  Fetal Status:           General:  Alert, oriented and cooperative. Patient is in no acute distress.  Skin: Skin is warm and dry. No rash noted.   Cardiovascular: Normal heart rate noted  Respiratory: Normal respiratory effort, no problems with respiration noted  Abdomen: Soft, gravid, appropriate for gestational age.        Pelvic: {Blank single:19197::"Cervical exam performed in the presence of a chaperone","Cervical exam deferred"}        Extremities: Normal range of motion.     Mental Status: Normal mood and affect. Normal behavior. Normal judgment and thought content.   Assessment and Plan:  Pregnancy: G5P4004 at [redacted]w[redacted]d 1. History of trichomoniasis ***  2. 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 Supervision of other normal pregnancy, antepartum ***  {Blank single:19197::"Term","Preterm"} labor symptoms and general obstetric precautions including but not limited to vaginal bleeding, contractions, leaking of fluid and fetal movement were reviewed in detail with the patient. Please refer to After Visit Summary for other counseling recommendations.   No follow-ups on file.  Future Appointments   Date Time Provider Department Center  08/09/2023  9:00 AM CENTERING PROVIDER [facility]  08/23/2023  9:00 AM CENTERING PROVIDER [facility]  09/06/2023  9:00 AM CENTERING PROVIDER [facility]  09/20/2023  9:00 AM CENTERING PROVIDER [facility]  10/11/2023  2:00 PM CENTERING PROVIDER [facility]  10/18/2023  9:00 AM CENTERING PROVIDER [facility]  11/01/2023  9:00 AM CENTERING PROVIDER [facility]  11/15/2023  9:00 AM CENTERING PROVIDER [facility]    Federico Flake, MD

## 2023-07-24 ENCOUNTER — Inpatient Hospital Stay (HOSPITAL_COMMUNITY)
Admission: AD | Admit: 2023-07-24 | Discharge: 2023-07-24 | Disposition: A | Discharge status: Home / Self Care | Payer: BLUE CROSS/BLUE SHIELD | Attending: Obstetrics and Gynecology | Admitting: Obstetrics and Gynecology

## 2023-07-24 ENCOUNTER — Encounter (HOSPITAL_COMMUNITY): Payer: Self-pay | Admitting: Obstetrics and Gynecology

## 2023-07-24 DIAGNOSIS — R1031 Right lower quadrant pain: Secondary | ICD-10-CM | POA: Diagnosis present

## 2023-07-24 DIAGNOSIS — R102 Pelvic and perineal pain: Secondary | ICD-10-CM | POA: Diagnosis not present

## 2023-07-24 DIAGNOSIS — O4702 False labor before 37 completed weeks of gestation, second trimester: Secondary | ICD-10-CM | POA: Insufficient documentation

## 2023-07-24 DIAGNOSIS — Z3A24 24 weeks gestation of pregnancy: Secondary | ICD-10-CM | POA: Diagnosis not present

## 2023-07-24 DIAGNOSIS — O26892 Other specified pregnancy related conditions, second trimester: Secondary | ICD-10-CM | POA: Insufficient documentation

## 2023-07-24 DIAGNOSIS — O479 False labor, unspecified: Secondary | ICD-10-CM

## 2023-07-24 DIAGNOSIS — N949 Unspecified condition associated with female genital organs and menstrual cycle: Secondary | ICD-10-CM

## 2023-07-24 LAB — WET PREP, GENITAL
Clue Cells Wet Prep HPF POC: NONE SEEN
Sperm: NONE SEEN
Trich, Wet Prep: NONE SEEN
WBC, Wet Prep HPF POC: <10 (ref <10)
Yeast Wet Prep HPF POC: NONE SEEN

## 2023-07-24 LAB — URINALYSIS, ROUTINE W REFLEX MICROSCOPIC
Bilirubin Urine: NEGATIVE
Glucose, UA: NEGATIVE mg/dL
Hgb urine dipstick: NEGATIVE
Ketones, ur: NEGATIVE mg/dL
Leukocytes,Ua: NEGATIVE
Nitrite: NEGATIVE
Protein, ur: NEGATIVE mg/dL
Specific Gravity, Urine: 1.02 (ref 1.005–1.030)
pH: 6 (ref 5.0–8.0)

## 2023-07-24 NOTE — MAU Note (Signed)
.  Julie [name] is a 27 y.o. at [redacted]w[redacted]d here in MAU reporting: sharp pain in her right lower abdomen that hurts when she walks. The pain started two days ago. She is also having intermittent pelvic pressure and intermittent tightening in her abdomen that comes and goes every 10 minutes, but does not feel like they are ctx. Denies VB or LOF. Reports +FM.  LMP: N/A Onset of complaint: Two days ago Pain score: 6/10 Vitals:   07/24/23 1033  BP: 105/60  Pulse: (!) 114  Resp: 16  Temp: 98.3 F (36.8 C)  SpO2: 100%     FHT:164  Lab orders placed from triage:  UA

## 2023-07-24 NOTE — MAU Provider Note (Signed)
[name] is a 27 y.o. Z6X0960 pregnant female at [redacted]w[redacted]d who presents to MAU today with complaint of R-sided abdominal pain and pelvic pressure.  Receives care at [facility]. Prenatal records reviewed.  No history PROM, cervical insufficiency. Uncomplicated pregnancy.  For the past 2 days, patient has been experiencing intermittent RLQ abdominal pain which she rates 5/10.  Only occurs when ambulating.  Resolves after rest. Has also been feeling concurrent pelvic pressure, feels like her "cervix is opening."  Sensation also only occurs with ambulation. Spends 14 hours on her feet daily at work. Denies LOF, VB.  Endorses +FM. Currently feels fine.  Notes some bilateral foot numbness directly below metatarsals, toes are fine.  Sensation occurs after standing for a long time, first noticed 3 days ago.  Pertinent items noted in HPI and remainder of comprehensive ROS otherwise negative.  O BP 101/67   Pulse (!) 110   Temp 98.3 F (36.8 C) (Oral)   Resp 16   Ht 5\' 5"  (1.651 m)   Wt 69.2 kg   LMP 02/06/2023   SpO2 100%   BMI 25.38 kg/m   Physical Exam Constitutional:      General: She is not in acute distress. Cardiovascular:     Rate and Rhythm: Normal rate.  Pulmonary:     Effort: Pulmonary effort is normal. No respiratory distress.  Abdominal:     General: There is no distension.     Tenderness: There is no abdominal tenderness. There is no guarding or rebound.     Comments: gravid abdomen  Skin:    General: Skin is warm and dry.     Capillary Refill: Capillary refill takes less than 2 seconds.  Neurological:     Mental Status: She is oriented to person, place, and time. Mental status is at baseline.   Initial pelvic/cervical exam:  Vulva: Normal appearing vulva with no masses, tenderness or lesions. Vagina: Normal appearing vagina with normal color, no lesions, without discharge. Cervix: Multiparous cervix, fingertip dilation, thick.  No lesions, no discharge  present.  Repeat cervical exam: Unchanged.    NST: 150bpm, moderate variability, +accels, intermittent variables but reassuring given gestational age  MDM: MAU Course:  A Round ligament pain  [redacted] weeks gestation of pregnancy  False labor Medical screening exam complete UA WNL. GC/chlamydia collected Wet prep collected  Will follow up with patient on Mychart on results of swabs.   Cervix initially fingertip. Recheck 1 hour later unchanged.   P Discharge from MAU in stable condition with preterm labor precautions. Round ligament exercises provided in AVS. Follow up at [facility] as scheduled for ongoing prenatal care.  Allergies as of 07/24/2023       Reactions   Citric Acid Rash        Medication List     TAKE these medications    acetaminophen 500 MG tablet Commonly known as: TYLENOL Take 1,000 mg by mouth every 6 (six) hours as needed for moderate pain.   Prenatal 27-1 MG Tabs Take 1 tablet by mouth daily.        Hessie Dibble, MD 07/24/2023 1:39 PM

## 2023-07-24 NOTE — Discharge Instructions (Signed)

## 2023-07-25 LAB — GC/CHLAMYDIA PROBE AMP (~~LOC~~) NOT AT ARMC
Chlamydia: NEGATIVE
Comment: NEGATIVE
Comment: NORMAL
Neisseria Gonorrhea: NEGATIVE

## 2023-08-09 ENCOUNTER — Encounter: Payer: Self-pay | Admitting: *Deleted

## 2023-08-09 ENCOUNTER — Telehealth: Payer: Self-pay | Admitting: *Deleted

## 2023-08-09 ENCOUNTER — Encounter: Payer: BLUE CROSS/BLUE SHIELD | Admitting: Family Medicine

## 2023-08-09 NOTE — Telephone Encounter (Signed)
Julie [name] has [facility] prenatal appointments. I called to notify her we are removing her from Cent[facility] and changing to traditional prenatal care. I left a message on her voicemail I was calling to discuss her appointments and will send a Mychart message for her to read and also asked her to call to schedule an appointment. Nancy Fetter

## 2023-08-22 ENCOUNTER — Encounter (HOSPITAL_COMMUNITY): Payer: Self-pay | Admitting: Obstetrics and Gynecology

## 2023-08-22 ENCOUNTER — Inpatient Hospital Stay (HOSPITAL_COMMUNITY)
Admission: AD | Admit: 2023-08-22 | Discharge: 2023-08-22 | Disposition: A | Discharge status: Home / Self Care | Payer: BLUE CROSS/BLUE SHIELD | Attending: Obstetrics and Gynecology | Admitting: Obstetrics and Gynecology

## 2023-08-22 DIAGNOSIS — O479 False labor, unspecified: Secondary | ICD-10-CM

## 2023-08-22 DIAGNOSIS — O26893 Other specified pregnancy related conditions, third trimester: Secondary | ICD-10-CM | POA: Insufficient documentation

## 2023-08-22 DIAGNOSIS — Z3A28 28 weeks gestation of pregnancy: Secondary | ICD-10-CM | POA: Diagnosis not present

## 2023-08-22 DIAGNOSIS — Z3689 Encounter for other specified antenatal screening: Secondary | ICD-10-CM

## 2023-08-22 DIAGNOSIS — O99613 Diseases of the digestive system complicating pregnancy, third trimester: Secondary | ICD-10-CM | POA: Insufficient documentation

## 2023-08-22 LAB — URINALYSIS, ROUTINE W REFLEX MICROSCOPIC
Bacteria, UA: NONE SEEN
Bilirubin Urine: NEGATIVE
Glucose, UA: NEGATIVE mg/dL
Hgb urine dipstick: NEGATIVE
Ketones, ur: NEGATIVE mg/dL
Leukocytes,Ua: NEGATIVE
Nitrite: NEGATIVE
Protein, ur: NEGATIVE mg/dL
Specific Gravity, Urine: 1.017 (ref 1.005–1.030)
pH: 6 (ref 5.0–8.0)

## 2023-08-22 LAB — WET PREP, GENITAL
Clue Cells Wet Prep HPF POC: NONE SEEN
Sperm: NONE SEEN
Trich, Wet Prep: NONE SEEN
WBC, Wet Prep HPF POC: <10 (ref <10)
Yeast Wet Prep HPF POC: NONE SEEN

## 2023-08-22 LAB — POCT FERN TEST: POCT Fern Test: NEGATIVE

## 2023-08-22 NOTE — MAU Note (Signed)
.  Julie [name] is a 27 y.o. at [redacted]w[redacted]d here in MAU reporting: Intermittent lower abdominal cramping since yesterday. She reports she went to [facility] and reports she was 1 cm. She reports after her cervical exam she noted "slimy clear discharge" for the rest of the evening. Denies water like discharge. Denies urinary sx's. Denies VB. Last IC one week ago. +FM.   Next OB appointment on 11/22.  Onset of complaint: yesterday Pain score: 4/10 lower abdomen  Vitals:   08/22/23 1520  BP: (!) 104/58  Pulse: (!) 103  Resp: 16  Temp: 98.1 F (36.7 C)  SpO2: 100%     FHT: 151 initial external Lab orders placed from triage: UA

## 2023-08-22 NOTE — MAU Provider Note (Signed)
History     CSN: [id number]  Arrival date and time: 08/22/23 1506   None     Chief Complaint  Patient presents with   Abdominal Pain   Vaginal Discharge   Patient reports occasional cramping which she states "Feels like Deberah Pelton." She reports the pain as mild and uncahgned in character or intensity since an exam yesterday. She reports cervix was 1cm at that time. She reports a "slimy" discharge for the rest of the day. States they did not collect vaginal swabs when she was examined yesterday. Denies VB or urinary symtpoms.       Abdominal Pain Pertinent negatives include no constipation, diarrhea, fever, frequency, nausea or vomiting.  Vaginal Discharge The patient's primary symptoms include vaginal discharge. Associated symptoms include abdominal pain. Pertinent negatives include no chills, constipation, diarrhea, fever, flank pain, frequency, nausea, urgency or vomiting.    OB History     Gravida  5   Para  4   Term  4   Preterm      AB      Living  4      SAB      IAB      Ectopic      Multiple  0   Live Births  4           Past Medical History:  Diagnosis Date   Anemia    GERD (gastroesophageal reflux disease)    Supervision of other normal pregnancy, antepartum 09/16/2019    Nursing Staff Provider Office Location  [facility] Dating  Ultrasound Language  English Anatomy US  Normal, isolated EIF  Flu Vaccine  Declined Genetic Screen  NIPS:   Low risk girl AFP:   Screen negative  TDaP vaccine   Declined Hgb A1C or  GTT Early  Third trimester    Ref Range & Units 1 d ago  Glucose, Fasting 65 - 91 mg/dL 76   Glucose, 1 hour 65 - 179 mg/dL 010   Glucose, 2 hour 65 - 152 m   SVD (spontaneous vaginal delivery) 10/04/2021   Trichomonas infection     Past Surgical History:  Procedure Laterality Date   CHOLECYSTECTOMY N/A 01/09/2018   Procedure: LAPAROSCOPIC CHOLECYSTECTOMY WITH INTRAOPERATIVE CHOLANGIOGRAM;  Surgeon: Claud Kelp, MD;  Location:  [facility];  Service: General;  Laterality: N/A;    Family History  Problem Relation Age of Onset   Heart disease Mother    Diabetes Mother    Pancreatic cancer Father    Cancer Maternal Grandfather     Social History   Tobacco Use   Smoking status: Never   Smokeless tobacco: Never  Vaping Use   Vaping status: Never Used  Substance Use Topics   Alcohol use: No   Drug use: No    Allergies:  Allergies  Allergen Reactions   Citric Acid Rash    Medications Prior to Admission  Medication Sig Dispense Refill Last Dose   acetaminophen (TYLENOL) 500 MG tablet Take 1,000 mg by mouth every 6 (six) hours as needed for moderate pain.      Prenatal 27-1 MG TABS Take 1 tablet by mouth daily. 30 tablet 9     Review of Systems  Constitutional:  Negative for chills, fatigue, fever and unexpected weight change.  Respiratory:  Negative for cough and shortness of breath.   Cardiovascular:  Negative for chest pain and palpitations.  Gastrointestinal:  Positive for abdominal pain. Negative for constipation, diarrhea, nausea and vomiting.  Genitourinary:  Positive for vaginal discharge. Negative for difficulty urinating, flank pain, frequency and urgency.   Physical Exam   Blood pressure (!) 104/58, pulse (!) 103, temperature 98.1 F (36.7 C), temperature source Oral, resp. rate 16, height 5\' 5"  (1.651 m), weight 71.1 kg, last menstrual period 02/06/2023, SpO2 100%, not currently breastfeeding.  Physical Exam Vitals reviewed. Exam conducted with a chaperone present.  Constitutional:      Appearance: Normal appearance.  HENT:     Head: Normocephalic.  Cardiovascular:     Pulses: Normal pulses.  Pulmonary:     Effort: Pulmonary effort is normal.  Genitourinary:    General: Normal vulva.     Cervix: Dilated.     Comments: 1cm/thick/ballotable. Membranes intact on exam.  Skin:    General: Skin is warm and dry.     Capillary Refill: Capillary refill takes less than 2  seconds.  Neurological:     Mental Status: She is alert and oriented to person, place, and time.  Psychiatric:        Mood and Affect: Mood normal.        Behavior: Behavior normal.        Thought Content: Thought content normal.        Judgment: Judgment normal.     Fetal Assessment 145 bpm, Mod Var, -Decels, +Accels Toco: UI  MAU Course   Results for orders placed or performed during the hospital encounter of 08/22/23 (from the past 24 hour(s))  Wet prep, genital     Status: None   Collection Time: 08/22/23  4:06 PM  Result Value Ref Range   Yeast Wet Prep HPF POC NONE SEEN NONE SEEN   Trich, Wet Prep NONE SEEN NONE SEEN   Clue Cells Wet Prep HPF POC NONE SEEN NONE SEEN   WBC, Wet Prep HPF POC <10 <10   Sperm NONE SEEN   Urinalysis, Routine w reflex microscopic -Urine, Clean Catch     Status: Abnormal   Collection Time: 08/22/23  4:10 PM  Result Value Ref Range   Color, Urine YELLOW YELLOW   APPearance HAZY (A) CLEAR   Specific Gravity, Urine 1.017 1.005 - 1.030   pH 6.0 5.0 - 8.0   Glucose, UA NEGATIVE NEGATIVE mg/dL   Hgb urine dipstick NEGATIVE NEGATIVE   Bilirubin Urine NEGATIVE NEGATIVE   Ketones, ur NEGATIVE NEGATIVE mg/dL   Protein, ur NEGATIVE NEGATIVE mg/dL   Nitrite NEGATIVE NEGATIVE   Leukocytes,Ua NEGATIVE NEGATIVE   RBC / HPF 0-5 0 - 5 RBC/hpf   WBC, UA 0-5 0 - 5 WBC/hpf   Bacteria, UA NONE SEEN NONE SEEN   Squamous Epithelial / HPF 11-20 0 - 5 /HPF   Mucus PRESENT   Fern Test     Status: None   Collection Time: 08/22/23  4:39 PM  Result Value Ref Range   POCT Fern Test Negative = intact amniotic membranes    No results found.  MDM PE Labs:  Wet prep, GC, Fern EFM  Assessment and Plan  27 y G5 P4004  SIUP at 28.1 weeks Cat 1 FT   - Exam findings discussed. -  Lab findings benign. - Cervical exam unchanged per patient reported exam at [facility] yesterday.  - Patient is not in preterm labor. Membranes are intact. - Counseled on preterm  labor symptoms.  - Return to MAU PRN. - Discharge home in stable condition.    Richardson Landry MSN, CNM 08/22/2023, 5:18 PM

## 2023-08-23 ENCOUNTER — Encounter: Payer: BLUE CROSS/BLUE SHIELD | Admitting: Obstetrics & Gynecology

## 2023-08-23 LAB — GC/CHLAMYDIA PROBE AMP (~~LOC~~) NOT AT ARMC
Chlamydia: NEGATIVE
Comment: NEGATIVE
Comment: NORMAL
Neisseria Gonorrhea: NEGATIVE

## 2023-09-11 ENCOUNTER — Encounter (HOSPITAL_COMMUNITY): Payer: Self-pay | Admitting: Obstetrics and Gynecology

## 2023-09-11 ENCOUNTER — Other Ambulatory Visit: Payer: Self-pay

## 2023-09-11 ENCOUNTER — Inpatient Hospital Stay (HOSPITAL_COMMUNITY)
Admission: AD | Admit: 2023-09-11 | Discharge: 2023-09-11 | Disposition: A | Discharge status: Home / Self Care | Payer: BLUE CROSS/BLUE SHIELD | Attending: Obstetrics and Gynecology | Admitting: Obstetrics and Gynecology

## 2023-09-11 DIAGNOSIS — Z3480 Encounter for supervision of other normal pregnancy, unspecified trimester: Secondary | ICD-10-CM

## 2023-09-11 DIAGNOSIS — Z3A31 31 weeks gestation of pregnancy: Secondary | ICD-10-CM | POA: Diagnosis not present

## 2023-09-11 DIAGNOSIS — O26893 Other specified pregnancy related conditions, third trimester: Secondary | ICD-10-CM | POA: Diagnosis not present

## 2023-09-11 DIAGNOSIS — Z3493 Encounter for supervision of normal pregnancy, unspecified, third trimester: Secondary | ICD-10-CM

## 2023-09-11 DIAGNOSIS — R102 Pelvic and perineal pain: Secondary | ICD-10-CM | POA: Diagnosis present

## 2023-09-11 DIAGNOSIS — O4703 False labor before 37 completed weeks of gestation, third trimester: Secondary | ICD-10-CM

## 2023-09-11 LAB — URINALYSIS, ROUTINE W REFLEX MICROSCOPIC
Bilirubin Urine: NEGATIVE
Glucose, UA: NEGATIVE mg/dL
Hgb urine dipstick: NEGATIVE
Ketones, ur: 20 mg/dL — AB
Nitrite: NEGATIVE
Protein, ur: 30 mg/dL — AB
Specific Gravity, Urine: 1.023 (ref 1.005–1.030)
pH: 5 (ref 5.0–8.0)

## 2023-09-11 LAB — WET PREP, GENITAL
Clue Cells Wet Prep HPF POC: NONE SEEN
Sperm: NONE SEEN
Trich, Wet Prep: NONE SEEN
WBC, Wet Prep HPF POC: >=10 — AB (ref <10)
Yeast Wet Prep HPF POC: NONE SEEN

## 2023-09-11 LAB — FETAL FIBRONECTIN: Fetal Fibronectin: NEGATIVE

## 2023-09-11 LAB — POCT FERN TEST: POCT Fern Test: NEGATIVE

## 2023-09-11 LAB — RUPTURE OF MEMBRANE (ROM)PLUS: Rom Plus: NEGATIVE

## 2023-09-11 NOTE — Discharge Instructions (Addendum)
You were seen in MAU today for rule out rupture of membranes, also known as "breaking water." We ran tests which determined that your water is not broken. Your cervix has not changed from previous exam and your test for preterm labor, a fetal fibronectin test was negative, so you are not in preterm labor and unlikely to be in preterm labor within the next two weeks.  PREGNANCY SUPPORT BELT: 75% of pregnant people have some sort of abdominal or back pain at some point in their pregnancy. Your body is adjusting to carrying a growing belly which pulls on the muscles in your back and can destabilize your pelvis and hips. This pain can be eased with daily stretching and movements, but also by wearing a pregnancy support belt. A support belt will help relieve back pain, abdominal, pelvic and leg pain, stabilize the pelvis and relieve pressure on the sciatic nerves. The best way to put it on is when laying down. Make sure it is low enough to wrap it around the underside of your belly and then stand up and attach the top portion. Wear this daily as your belly grows for the best relief of pain.  WHERE TO GET YOUR PREGNANCY BELT:  [facility] [address] [place], [place] [address] [phone]  [facility] [address] [place], [place] [address] [phone]    Safe Medications in Pregnancy   Acne:  Benzoyl Peroxide  Salicylic Acid   Backache/Headache:  Tylenol: 2 regular strength every 4 hours OR               2 Extra strength every 6 hours   Colds/Coughs/Allergies:  Benadryl (alcohol free) 25 mg every 6 hours as needed  Breath right strips  Claritin  Cepacol throat lozenges  Chloraseptic throat spray  Cold-Eeze- up to three times per day  Cough drops, alcohol free  Flonase (by prescription only)  Guaifenesin  Mucinex  Robitussin DM (plain only, alcohol free)  Saline nasal spray/drops  Sudafed (pseudoephedrine) & Actifed * use only after [redacted]  weeks gestation and if you do not have high blood pressure  Tylenol  Vicks Vaporub  Zinc lozenges  Zyrtec   Constipation:  Colace  Ducolax suppositories  Fleet enema  Glycerin suppositories  Metamucil  Milk of magnesia  Miralax  Senokot  Smooth move tea   Diarrhea:  Kaopectate  Imodium A-D   *NO pepto Bismol   Hemorrhoids:  Anusol  Anusol HC  Preparation H  Tucks   Indigestion:  Tums  Maalox  Mylanta  Zantac  Pepcid   Insomnia:  Benadryl (alcohol free) 25mg  every 6 hours as needed  Tylenol PM  Unisom, no Gelcaps   Leg Cramps:  Tums  MagGel   Nausea/Vomiting:  Bonine  Dramamine  Emetrol  Ginger extract  Sea bands  Meclizine  Nausea medication to take during pregnancy:  Unisom (doxylamine succinate 25 mg tablets) Take one tablet daily at bedtime. If symptoms are not adequately controlled, the dose can be increased to a maximum recommended dose of two tablets daily (1/2 tablet in the morning, 1/2 tablet mid-afternoon and one at bedtime).  Vitamin B6 100mg  tablets. Take one tablet twice a day (up to 200 mg per day).   Skin Rashes:  Aveeno products  Benadryl cream or 25mg  every 6 hours as needed  Calamine Lotion  1% cortisone cream   Yeast infection:  Gyne-lotrimin 7  Monistat 7    **If taking multiple medications, please check labels  to avoid duplicating the same active ingredients  **take medication as directed on the label  ** Do not exceed 4000 mg of tylenol in 24 hours  **Do not take medications that contain aspirin or ibuprofen

## 2023-09-11 NOTE — MAU Note (Signed)
Julie [name] is a 27 y.o. at [redacted]w[redacted]d here in MAU reporting: she's thinks she's been leaking fluid over the 2 weeks.  Reports was seen in MAU and told her water wasn't broke, but continues to leak.  Also c/o lower abdomen, back, and pelvic pain since yesterday.  Denies VB, has clear odorless vaginal discharge.  Endorses +FM. LMP: NA Onset of complaint: 2 weeks Pain score: 5 abdomen and back, 7 pelvis Vitals:   09/11/23 1039  BP: 113/65  Pulse: (!) 108  Resp: 18  Temp: 98 F (36.7 C)  SpO2: 99%     FHT:155 bpm Lab orders placed from triage:   UA

## 2023-09-11 NOTE — MAU Provider Note (Signed)
History     CSN: [id number]  Arrival date and time: 09/11/23 1010   None     Chief Complaint  Patient presents with   Abdominal Pain   Back Pain   Pelvic Pain   Rupture of Membranes   Julie [name] is a 27 y.o. G5 P4 at [redacted]w[redacted]d who receives care at [facility].  She presents today for rule out ROM and pelvic pressure.  Patient reports that she has increased vaginal discharge which she states has caused concern that her water broke. She states that she additionally feels pelvic pressure, which is worse when she gets out of bed and after long periods of sitting or after driving. She has not tried and pregnancy support belt.   OB History     Gravida  5   Para  4   Term  4   Preterm      AB      Living  4      SAB      IAB      Ectopic      Multiple  0   Live Births  4           Past Medical History:  Diagnosis Date   Anemia    GERD (gastroesophageal reflux disease)    Supervision of other normal pregnancy, antepartum 09/16/2019    Nursing Staff Provider Office Location  [facility] Dating  Ultrasound Language  English Anatomy US  Normal, isolated EIF  Flu Vaccine  Declined Genetic Screen  NIPS:   Low risk girl AFP:   Screen negative  TDaP vaccine   Declined Hgb A1C or  GTT Early  Third trimester    Ref Range & Units 1 d ago  Glucose, Fasting 65 - 91 mg/dL 76   Glucose, 1 hour 65 - 179 mg/dL 409   Glucose, 2 hour 65 - 152 m   SVD (spontaneous vaginal delivery) 10/04/2021   Trichomonas infection     Past Surgical History:  Procedure Laterality Date   CHOLECYSTECTOMY N/A 01/09/2018   Procedure: LAPAROSCOPIC CHOLECYSTECTOMY WITH INTRAOPERATIVE CHOLANGIOGRAM;  Surgeon: Claud Kelp, MD;  Location: [facility];  Service: General;  Laterality: N/A;    Family History  Problem Relation Age of Onset   Heart disease Mother    Diabetes Mother    Pancreatic cancer Father    Cancer Maternal Grandfather     Social History   Tobacco Use    Smoking status: Never   Smokeless tobacco: Never  Vaping Use   Vaping status: Never Used  Substance Use Topics   Alcohol use: No   Drug use: No    Allergies:  Allergies  Allergen Reactions   Citric Acid Rash    Medications Prior to Admission  Medication Sig Dispense Refill Last Dose   Prenatal 27-1 MG TABS Take 1 tablet by mouth daily. 30 tablet 9 Past Week   acetaminophen (TYLENOL) 500 MG tablet Take 1,000 mg by mouth every 6 (six) hours as needed for moderate pain.   More than a month    Review of Systems  Constitutional:  Negative for chills, fatigue, fever and unexpected weight change.  Respiratory:  Negative for cough and shortness of breath.   Cardiovascular:  Negative for chest pain and palpitations.  Gastrointestinal:  Negative for abdominal pain, constipation, diarrhea, nausea and vomiting.  Genitourinary:  Positive for pelvic pain and vaginal discharge. Negative for difficulty urinating, flank pain, frequency and urgency.   Physical Exam  Blood pressure 113/65, pulse (!) 108, temperature 98 F (36.7 C), temperature source Oral, resp. rate 18, height 5\' 5"  (1.651 m), weight 69.2 kg, last menstrual period 02/06/2023, SpO2 99%, not currently breastfeeding.  Physical Exam Vitals reviewed.  Constitutional:      General: She is not in acute distress.    Appearance: Normal appearance. She is well-developed. She is not ill-appearing, toxic-appearing or diaphoretic.  HENT:     Head: Normocephalic.  Cardiovascular:     Rate and Rhythm: Normal rate.     Pulses: Normal pulses.     Heart sounds: Normal heart sounds.  Pulmonary:     Effort: Pulmonary effort is normal.  Skin:    General: Skin is warm and dry.     Capillary Refill: Capillary refill takes less than 2 seconds.  Neurological:     Mental Status: She is alert and oriented to person, place, and time.  Psychiatric:        Mood and Affect: Mood normal.        Behavior: Behavior normal.        Thought Content:  Thought content normal.        Judgment: Judgment normal.     Fetal Assessment 150 bpm, Mod Var, -Decels, +Accels Toco: UI  MAU Course   Results for orders placed or performed during the hospital encounter of 09/11/23 (from the past 24 hour(s))  Urinalysis, Routine w reflex microscopic -Urine, Clean Catch     Status: Abnormal   Collection Time: 09/11/23 11:04 AM  Result Value Ref Range   Color, Urine AMBER (A) YELLOW   APPearance HAZY (A) CLEAR   Specific Gravity, Urine 1.023 1.005 - 1.030   pH 5.0 5.0 - 8.0   Glucose, UA NEGATIVE NEGATIVE mg/dL   Hgb urine dipstick NEGATIVE NEGATIVE   Bilirubin Urine NEGATIVE NEGATIVE   Ketones, ur 20 (A) NEGATIVE mg/dL   Protein, ur 30 (A) NEGATIVE mg/dL   Nitrite NEGATIVE NEGATIVE   Leukocytes,Ua SMALL (A) NEGATIVE   RBC / HPF 0-5 0 - 5 RBC/hpf   WBC, UA 11-20 0 - 5 WBC/hpf   Bacteria, UA MANY (A) NONE SEEN   Squamous Epithelial / HPF 11-20 0 - 5 /HPF   Mucus PRESENT   Fetal fibronectin     Status: None   Collection Time: 09/11/23 12:00 PM  Result Value Ref Range   Fetal Fibronectin NEGATIVE NEGATIVE  Rupture of Membrane (ROM) Plus     Status: None   Collection Time: 09/11/23 12:00 PM  Result Value Ref Range   Rom Plus NEGATIVE   Wet prep, genital     Status: Abnormal   Collection Time: 09/11/23 12:00 PM  Result Value Ref Range   Yeast Wet Prep HPF POC NONE SEEN NONE SEEN   Trich, Wet Prep NONE SEEN NONE SEEN   Clue Cells Wet Prep HPF POC NONE SEEN NONE SEEN   WBC, Wet Prep HPF POC >=10 (A) <10   Sperm NONE SEEN   Fern Test     Status: None   Collection Time: 09/11/23 12:01 PM  Result Value Ref Range   POCT Fern Test Negative = intact amniotic membranes    No results found.  MDM PE Labs: Wet prep, UA, GC, fern, ROM plus, fFN EFM Speculum exam Review of external prenatal records.  SVE unchanged over course of several weeks  Assessment and Plan  27yo  G5P4004  SIUP at 31.0 weeks Cat 1 FT - period of approximately 10  minutes with recurrent variables, resolved with position change. Reactive strip, reassuring.   - Exam findings discussed. Patient verbalizes understanding.  - Counseled on pregnancy support belt, comfort measures for relief of pelvic pressure.  - All measures of preterm labor and premature ROM, negative and reassuring. Counseled patient on findings. - May return to MAU PRN. - Encouraged to follow up with primary OB provider. - Discharged in stable condition.    Richardson Landry MSN, CNM 09/11/2023, 1:30 PM

## 2023-09-12 LAB — GC/CHLAMYDIA PROBE AMP (~~LOC~~) NOT AT ARMC
Chlamydia: NEGATIVE
Comment: NEGATIVE
Comment: NORMAL
Neisseria Gonorrhea: NEGATIVE

## 2023-10-16 NOTE — H&P (Addendum)
 [facility]  HISTORY & PHYSICAL DEPARTMENT OF OB-GYN   NAME: Julie [name] MRN: [id number] DOB: August 15, 1996 DATE: 10/16/2023  CC: Contractions  HPI: Ms Julie [name] is a 28 y.o. H4E5995 at 36 weeks presents complaining of contractions that she states is every 5 minutes.  No vaginal bleeding or leaking of fluid.  Good fetal movement.  Pain scale 7/10.  Recent transfer to our practice for prenatal care.  4 term vaginal deliveries in her past.   DATING: 05/03/2021, by Last Menstrual Period  ROS: Per HPI  PMH:  Past Medical History:  Diagnosis Date  . Lewis isoimmunization during pregnancy     PSH: Past Surgical History:  Procedure Laterality Date  . CHOLECYSTECTOMY  01/09/2018    MEDS: Home Medications     Med List Status: Nurse complete Set By: Grayling Ada, RN at 10/16/2023 12:35 PM  No medications reported.     ALLERGIES: Allergies  Allergen Reactions  . Citric Acid  Rash    SOC: Social History   Tobacco Use  . Smoking status: Never  . Smokeless tobacco: Never  Vaping Use  . Vaping status: Never Used  Substance Use Topics  . Alcohol use: No  . Drug use: Never    Comment: Drug use: Denies    FAM: Family History  Problem Relation Name Age of Onset  . Arthritis Mother    . Asthma Mother    . Cancer Father    . Diabetes type II Mother    . Hyperlipidemia Mother    . Hyperlipidemia Father    . Hypertension Mother    . Hypertension Father    . Hypertension Maternal Grandmother    . Hypertension Paternal Grandmother    . Hypertension Maternal Grandfather    . Hypertension Paternal Grandfather      PHYSICAL EXAM: There were no vitals filed for this visit.  GEN: NAD HEART: Appears well perfused LUNGS: Even, unlabored breathing ABD: Soft, Gravid, NTTP EXTREMITIES: No bilateral edema, no calf tenderness NEURO: A&O PSYCH: Normal affect   SVE: FT/50/-3; vertex  NST: FHT:  140 bpm with good variability, +accels, no  decels Toco:  Irregular  ASSESSMENT/PLAN: 28 y.o. H4E5995 with preterm contraction but no preterm labor.  Reassuring monitoring.  Will discharge patient home with as needed oral Procardia for contractions.  Labor precautions and kick counts discussed.  Follow-up as scheduled at the office on Friday.  GBS and Gen probe sent.    DISPOSITION: Discharge

## 2024-01-08 ENCOUNTER — Ambulatory Visit (HOSPITAL_COMMUNITY)
Admission: EM | Admit: 2024-01-08 | Discharge: 2024-01-08 | Disposition: A | Discharge status: Home / Self Care | Payer: Self-pay | Attending: Physician Assistant | Admitting: Physician Assistant

## 2024-01-08 ENCOUNTER — Encounter (HOSPITAL_COMMUNITY): Payer: Self-pay

## 2024-01-08 DIAGNOSIS — N390 Urinary tract infection, site not specified: Secondary | ICD-10-CM

## 2024-01-08 LAB — POCT URINALYSIS DIP (MANUAL ENTRY)
Bilirubin, UA: NEGATIVE
Blood, UA: NEGATIVE
Glucose, UA: NEGATIVE mg/dL
Nitrite, UA: POSITIVE — AB
Spec Grav, UA: 1.025 (ref 1.010–1.025)
Urobilinogen, UA: 0.2 U/dL
pH, UA: 6 (ref 5.0–8.0)

## 2024-01-08 MED ORDER — CEPHALEXIN 500 MG PO CAPS: ORAL_CAPSULE | ORAL | 0 refills | Status: AC

## 2024-01-08 NOTE — ED Triage Notes (Signed)
 Patient here today with c/o her urine being dark in color X 2 weeks. Patient denies pain but she just wanted to make sure she did't have a UTI. She also noticed some odor.

## 2024-01-08 NOTE — Discharge Instructions (Addendum)
Return if any problems.  You have test pending 

## 2024-01-08 NOTE — ED Provider Notes (Signed)
 [facility]    CSN: [id number] Arrival date & time: 01/08/24  1539      History   Chief Complaint Chief Complaint  Patient presents with   Dark Urine    HPI Julie [name] is a 28 y.o. female.   Patient concerned that she could have a urinary tract infection.  Patient reports her urine has been dark.  Patient reports some discomfort with urination  The history is provided by the patient. No language interpreter was used.    Past Medical History:  Diagnosis Date   Anemia    GERD (gastroesophageal reflux disease)    Supervision of other normal pregnancy, antepartum 09/16/2019    Nursing Staff Provider Office Location  [place] Dating  Ultrasound Language  English Anatomy US  Normal, isolated EIF  Flu Vaccine  Declined Genetic Screen  NIPS:   Low risk girl AFP:   Screen negative  TDaP vaccine   Declined Hgb A1C or  GTT Early  Third trimester    Ref Range & Units 1 d ago  Glucose, Fasting 65 - 91 mg/dL 76   Glucose, 1 hour 65 - 179 mg/dL 782   Glucose, 2 hour 65 - 152 m   SVD (spontaneous vaginal delivery) 10/04/2021   Trichomonas infection     Patient Active Problem List   Diagnosis Date Noted   Supervision of other normal pregnancy, antepartum 05/16/2023   History of trichomoniasis 06/06/2021   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 Authorizing Provider  cephALEXin (KEFLEX) 500 MG capsule One tablet po tid  01/08/24  Yes Cheron Schaumann K, PA-C  acetaminophen (TYLENOL) 500 MG tablet Take 1,000 mg by mouth every 6 (six) hours as needed for moderate pain.    [provider]    Family History Family History  Problem Relation Age of Onset   Heart disease Mother    Diabetes Mother    Pancreatic cancer Father    Cancer Maternal Grandfather     Social History Social History   Tobacco Use   Smoking status: Never   Smokeless tobacco: Never  Vaping Use   Vaping status: Never Used  Substance Use Topics   Alcohol use: No   Drug use: No     Allergies   Citric acid   Review of Systems Review of Systems  All other systems reviewed and are negative.    Physical Exam Triage Vital Signs ED Triage Vitals  Encounter Vitals Group     BP 01/08/24 1626 134/86     Systolic BP Percentile --      Diastolic BP Percentile --      Pulse Rate 01/08/24 1626 89     Resp  01/08/24 1626 16     Temp 01/08/24 1626 98.4 F (36.9 C)     Temp Source 01/08/24 1626 Oral     SpO2 01/08/24 1626 98 %     Weight 01/08/24 1627 140 lb (63.5 kg)     Height 01/08/24 1627 5\' 5"  (1.651 m)     Head Circumference --      Peak Flow --      Pain Score 01/08/24 1629 0     Pain Loc --      Pain Education --      Exclude from Growth Chart --    No data found.  Updated Vital Signs BP 134/86 (BP Location: Right Arm)   Pulse 89   Temp 98.4 F (36.9 C) (Oral)   Resp 16   Ht 5\' 5"  (1.651 m)   Wt 63.5 kg   LMP 12/26/2023 (Approximate)   SpO2 98%   Breastfeeding No   BMI 23.30 kg/m   Visual Acuity Right Eye Distance:   Left Eye Distance:   Bilateral Distance:    Right Eye Near:   Left Eye Near:    Bilateral Near:     Physical Exam Vitals and nursing note reviewed.  Constitutional:      Appearance: She is well-developed.  HENT:     Head: Normocephalic.  Cardiovascular:     Rate and Rhythm: Normal rate.  Pulmonary:     Effort: Pulmonary effort is normal.  Abdominal:     General: There is  no distension.  Musculoskeletal:        General: Normal range of motion.     Cervical back: Normal range of motion.  Skin:    General: Skin is warm.  Neurological:     General: No focal deficit present.     Mental Status: She is alert and oriented to person, place, and time.      UC Treatments / Results  Labs (all labs ordered are listed, but only abnormal results are displayed) Labs Reviewed  POCT URINALYSIS DIP (MANUAL ENTRY) - Abnormal; Notable for the following components:      Result Value   Clarity, UA cloudy (*)    Ketones, POC UA moderate (40) (*)    Protein Ur, POC trace (*)    Nitrite, UA Positive (*)    Leukocytes, UA Small (1+) (*)    All other components within normal limits  CERVICOVAGINAL ANCILLARY ONLY    EKG   Radiology No results found.  Procedures Procedures (including critical care time)  Medications Ordered in UC Medications - No data to display  Initial Impression / Assessment and Plan / UC Course  I have reviewed the triage vital signs and the nursing notes.  Pertinent labs & imaging results that were available during my care of the patient were reviewed by me and considered in my medical decision making (see chart for details).     UA shows nitrates and leukocytes.  Patient is given a prescription for Keflex.  Patient also request STD testing.  GC chlamydia are pending. Final Clinical Impressions(s) / UC Diagnoses   Final diagnoses:  Urinary tract infection without hematuria, site unspecified     Discharge Instructions      Return if any problems.  You have test pending   ED Prescriptions     Medication Sig Dispense Auth. Provider   cephALEXin (KEFLEX) 500 MG capsule One tablet po tid 21 capsule Elson Areas, [place]      PDMP not  reviewed this encounter. An After Visit Summary was printed and given to the patient.       Elson Areas, [place] 01/08/24 2052

## 2024-01-09 LAB — CERVICOVAGINAL ANCILLARY ONLY
Bacterial Vaginitis (gardnerella): NEGATIVE
Candida Glabrata: NEGATIVE
Candida Vaginitis: POSITIVE — AB
Chlamydia: NEGATIVE
Comment: NEGATIVE
Comment: NEGATIVE
Comment: NEGATIVE
Comment: NEGATIVE
Comment: NEGATIVE
Comment: NORMAL
Neisseria Gonorrhea: NEGATIVE
Trichomonas: NEGATIVE

## 2024-01-10 ENCOUNTER — Telehealth (HOSPITAL_COMMUNITY): Payer: Self-pay

## 2024-01-10 MED ORDER — FLUCONAZOLE 150 MG PO TABS: 150.0000 mg | ORAL_TABLET | Freq: Once | ORAL | 0 refills | Status: AC

## 2024-01-10 NOTE — Telephone Encounter (Signed)
 Per protocol, pt requires tx with Diflucan.  Rx sent to pharmacy on file.

## 2024-05-23 ENCOUNTER — Encounter (HOSPITAL_COMMUNITY): Payer: Self-pay

## 2024-05-23 ENCOUNTER — Emergency Department (HOSPITAL_COMMUNITY)
Admission: EM | Admit: 2024-05-23 | Discharge: 2024-05-23 | Discharge status: Against Medical Advice | Payer: Self-pay | Attending: Emergency Medicine | Admitting: Emergency Medicine

## 2024-05-23 ENCOUNTER — Telehealth (HOSPITAL_COMMUNITY): Payer: Self-pay | Admitting: Emergency Medicine

## 2024-05-23 ENCOUNTER — Other Ambulatory Visit: Payer: Self-pay

## 2024-05-23 DIAGNOSIS — Z5321 Procedure and treatment not carried out due to patient leaving prior to being seen by health care provider: Secondary | ICD-10-CM | POA: Insufficient documentation

## 2024-05-23 DIAGNOSIS — O209 Hemorrhage in early pregnancy, unspecified: Secondary | ICD-10-CM | POA: Insufficient documentation

## 2024-05-23 LAB — CBC WITH DIFFERENTIAL/PLATELET
Abs Immature Granulocytes: 0.03 K/uL (ref 0.00–0.07)
Basophils Absolute: 0 K/uL (ref 0.0–0.1)
Basophils Relative: 0 %
Eosinophils Absolute: 0 K/uL (ref 0.0–0.5)
Eosinophils Relative: 0 %
HCT: 36.7 % (ref 36.0–46.0)
Hemoglobin: 11.4 g/dL — ABNORMAL LOW (ref 12.0–15.0)
Immature Granulocytes: 1 %
Lymphocytes Relative: 31 %
Lymphs Abs: 1.8 K/uL (ref 0.7–4.0)
MCH: 27.3 pg (ref 26.0–34.0)
MCHC: 31.1 g/dL (ref 30.0–36.0)
MCV: 87.8 fL (ref 80.0–100.0)
Monocytes Absolute: 0.7 K/uL (ref 0.1–1.0)
Monocytes Relative: 12 %
Neutro Abs: 3.2 K/uL (ref 1.7–7.7)
Neutrophils Relative %: 56 %
Platelets: 359 K/uL (ref 150–400)
RBC: 4.18 MIL/uL (ref 3.87–5.11)
RDW: 12.7 % (ref 11.5–15.5)
WBC: 5.7 K/uL (ref 4.0–10.5)
nRBC: 0 % (ref 0.0–0.2)

## 2024-05-23 LAB — HCG, SERUM, QUALITATIVE: Preg, Serum: POSITIVE — AB

## 2024-05-23 NOTE — Telephone Encounter (Signed)
 Patient was seen in the emergency room earlier today.  She has a positive urine pregnancy test.  I ordered quant hCG and ultrasound for vaginal bleeding in pregnancy.  Unfortunately, patient had already left when the nursing staff tried to call her back for hCG quant.  I have called the patient at [phone] , left a HIPAA compliant message asking her to return to the ER to complete her workup.  I requested nursing staff to also call her and see if they can get her to return for comprehensive workup.

## 2024-05-23 NOTE — ED Notes (Signed)
 Patient called a third time for vitals. No response. Patient is being moved OTF.

## 2024-05-23 NOTE — ED Notes (Signed)
 Patient called for a second time to update vital signs. No response received.

## 2024-05-23 NOTE — ED Triage Notes (Signed)
 Patient had a blood clot size of a golf ball out of her vagina today. Stated she is pregnant, unknown how many weeks. This is her 6th baby. Last period was June. Denies pelvic cramping. Stated she is now having spotting.

## 2024-05-24 ENCOUNTER — Other Ambulatory Visit: Payer: Self-pay

## 2024-05-24 ENCOUNTER — Emergency Department (HOSPITAL_COMMUNITY): Payer: Self-pay

## 2024-05-24 ENCOUNTER — Encounter (HOSPITAL_COMMUNITY): Payer: Self-pay

## 2024-05-24 ENCOUNTER — Emergency Department (HOSPITAL_COMMUNITY)
Admission: EM | Admit: 2024-05-24 | Discharge: 2024-05-24 | Disposition: A | Discharge status: Home / Self Care | Payer: Self-pay | Attending: Emergency Medicine | Admitting: Emergency Medicine

## 2024-05-24 DIAGNOSIS — Z3A09 9 weeks gestation of pregnancy: Secondary | ICD-10-CM | POA: Insufficient documentation

## 2024-05-24 DIAGNOSIS — O209 Hemorrhage in early pregnancy, unspecified: Secondary | ICD-10-CM | POA: Insufficient documentation

## 2024-05-24 LAB — COMPREHENSIVE METABOLIC PANEL WITH GFR
ALT: 24 U/L (ref 0–44)
AST: 30 U/L (ref 15–41)
Albumin: 4 g/dL (ref 3.5–5.0)
Alkaline Phosphatase: 64 U/L (ref 38–126)
Anion gap: 9 (ref 5–15)
BUN: <5 mg/dL — ABNORMAL LOW (ref 6–20)
CO2: 23 mmol/L (ref 22–32)
Calcium: 9.4 mg/dL (ref 8.9–10.3)
Chloride: 102 mmol/L (ref 98–111)
Creatinine, Ser: 0.71 mg/dL (ref 0.44–1.00)
GFR, Estimated: >60 mL/min (ref >60)
Glucose, Bld: 102 mg/dL — ABNORMAL HIGH (ref 70–99)
Potassium: 4 mmol/L (ref 3.5–5.1)
Sodium: 134 mmol/L — ABNORMAL LOW (ref 135–145)
Total Bilirubin: 0.7 mg/dL (ref 0.0–1.2)
Total Protein: 8 g/dL (ref 6.5–8.1)

## 2024-05-24 LAB — MAGNESIUM: Magnesium: 1.9 mg/dL (ref 1.7–2.4)

## 2024-05-24 LAB — HCG, QUANTITATIVE, PREGNANCY: hCG, Beta Chain, Quant, S: 132666 m[IU]/mL — ABNORMAL HIGH (ref <5)

## 2024-05-24 LAB — CBC WITH DIFFERENTIAL/PLATELET
Abs Immature Granulocytes: 0.02 K/uL (ref 0.00–0.07)
Basophils Absolute: 0 K/uL (ref 0.0–0.1)
Basophils Relative: 0 %
Eosinophils Absolute: 0 K/uL (ref 0.0–0.5)
Eosinophils Relative: 0 %
HCT: 35.9 % — ABNORMAL LOW (ref 36.0–46.0)
Hemoglobin: 11.5 g/dL — ABNORMAL LOW (ref 12.0–15.0)
Immature Granulocytes: 0 %
Lymphocytes Relative: 34 %
Lymphs Abs: 2.6 K/uL (ref 0.7–4.0)
MCH: 28 pg (ref 26.0–34.0)
MCHC: 32 g/dL (ref 30.0–36.0)
MCV: 87.6 fL (ref 80.0–100.0)
Monocytes Absolute: 1 K/uL (ref 0.1–1.0)
Monocytes Relative: 13 %
Neutro Abs: 3.9 K/uL (ref 1.7–7.7)
Neutrophils Relative %: 53 %
Platelets: 374 K/uL (ref 150–400)
RBC: 4.1 MIL/uL (ref 3.87–5.11)
RDW: 12.6 % (ref 11.5–15.5)
WBC: 7.5 K/uL (ref 4.0–10.5)
nRBC: 0 % (ref 0.0–0.2)

## 2024-05-24 LAB — HCG, SERUM, QUALITATIVE: Preg, Serum: POSITIVE — AB

## 2024-05-24 MED ORDER — PRENATAL VITAMINS 28-0.8 MG PO TABS: ORAL_TABLET | ORAL | 0 refills | Status: AC

## 2024-05-24 NOTE — Discharge Instructions (Addendum)
 Return if any problems.  Follow up with OB/Gyn for prenatal care

## 2024-05-24 NOTE — ED Triage Notes (Addendum)
 Pt came in for heavy vaginal bleeding and is pregnant. Pt stated that the bleeding has subsided and she is unaware of how far along she is. The bleeding started a week ago. Pt was here yesterday and was told to come back today for an ultrasound because she's having pain on the left side of her abdomen.

## 2024-05-24 NOTE — ED Provider Triage Note (Addendum)
 Emergency Medicine Provider Triage Evaluation Note  Julie [name] , a 28 y.o. female  was evaluated in triage.  Pt complains of vaginal bleeding in setting of pregnancy. Unsure of far along she is. Large clot passed a week ago. Now spotting. Last cycle in June. Home pregnancy test positive.  Review of Systems  Positive: As above Negative: As above  Physical Exam  BP 138/84 (BP Location: Right Arm)   Pulse 96   Temp 98.6 F (37 C) (Oral)   Resp 18   SpO2 100%  Gen:   Awake, no distress   Resp:  Normal effort  MSK:   Moves extremities without difficulty Other:    Medical Decision Making  Medically screening exam initiated at 4:57 PM.  Appropriate orders placed.  KELCY LAIBLE was informed that the remainder of the evaluation will be completed by another provider, this initial triage assessment does not replace that evaluation, and the importance of remaining in the ED until their evaluation is complete.     Hildegard Loge, PA-C 05/24/24 [address], PA-C 05/24/24 1659

## 2024-05-24 NOTE — ED Provider Notes (Signed)
 [place] EMERGENCY DEPARTMENT AT [facility] Provider Note   CSN: [id number] Arrival date & time: 05/24/24  1627     Patient presents with: Vaginal Bleeding   Julie [name] is a 28 y.o. female.   Patient complains of pregnancy and vaginal bleeding.  Patient reports that her last period was in June.  Patient reports she had bleeding earlier in the week she is not currently having any bleeding.  Patient reports some lower abdominal discomfort earlier in the week.  Patient denies any fever or chills she has not had any burning with urination she denies any vaginal discharge no STD risk.  She has not had any prenatal care.  Patient is a gravida 6 para 5.  Patient has 5 living children.  The history is provided by the patient. No language interpreter was used.  Vaginal Bleeding      Prior to Admission medications   Medication Sig Start Date End Date Taking? Authorizing Provider  Prenatal Vit-Fe Fumarate-FA (PRENATAL VITAMINS) 28-0.8 MG TABS One tablet a day 05/24/24  Yes Dionel Archey K, PA-C  acetaminophen  (TYLENOL ) 500 MG tablet Take 1,000 mg by mouth every 6 (six) hours as needed for moderate pain.    [provider]  cephALEXin  (KEFLEX ) 500 MG capsule One tablet po tid 01/08/24   Dauna Ziska K, PA-C    Allergies: Citric acid     Review of Systems  Genitourinary:  Positive for vaginal bleeding.  All other systems reviewed and are negative.   Updated Vital Signs BP 134/76   Pulse 64   Temp 98.5 F (36.9 C)   Resp 17   SpO2 95%   Physical Exam Vitals and nursing note reviewed.  Constitutional:      Appearance: She is well-developed.  HENT:     Head: Normocephalic.  Cardiovascular:     Rate and Rhythm: Normal rate.  Pulmonary:     Effort: Pulmonary effort is normal.  Abdominal:     General: Bowel sounds are normal. There is no distension.     Palpations: Abdomen is soft.  Musculoskeletal:        General: Normal range of motion.      Cervical back: Normal range of motion.  Skin:    General: Skin is warm.  Neurological:     General: No focal deficit present.     Mental Status: She is alert and oriented to person, place, and time.     (all labs ordered are listed, but only abnormal results are displayed) Labs Reviewed  CBC WITH DIFFERENTIAL/PLATELET - Abnormal; Notable for the following components:      Result Value   Hemoglobin 11.5 (*)    HCT 35.9 (*)    All other components within normal limits  HCG, SERUM, QUALITATIVE - Abnormal; Notable for the following components:   Preg, Serum POSITIVE (*)    All other components within normal limits  HCG, QUANTITATIVE, PREGNANCY - Abnormal; Notable for the following components:   hCG, Beta Chain, Quant, S 132,666 (*)    All other components within normal limits  COMPREHENSIVE METABOLIC PANEL WITH GFR - Abnormal; Notable for the following components:   Sodium 134 (*)    Glucose, Bld 102 (*)    BUN <5 (*)    All other components within normal limits  MAGNESIUM  URINALYSIS, ROUTINE W REFLEX MICROSCOPIC    EKG: None  Radiology: US  OB Comp < 14 Wks Result Date: 05/24/2024 CLINICAL DATA:  Vaginal bleeding, positive pregnancy test  EXAM: OBSTETRIC <14 WK ULTRASOUND TECHNIQUE: Transabdominal ultrasound was performed for evaluation of the gestation as well as the maternal uterus and adnexal regions. COMPARISON:  None Available. FINDINGS: Intrauterine gestational sac: Present Yolk sac:  Present Embryo:  Present Cardiac Activity: Present Heart Rate: 196 bpm CRL:   22 mm   8 w 6 d Subchorionic hemorrhage:  None visualized. Maternal uterus/adnexae: Left ovary shows a dominant 4.9 cm simple appearing cyst. Right adnexa shows a 5.8 cm mildly hyperechoic structure which may represent a hemorrhagic cyst. Subsequent follow-up can be determined as clinically necessary. IMPRESSION: Single live intrauterine gestation at 8 weeks 6 days. Adnexal changes as described. Follow-up can be performed  as clinically indicated. Electronically Signed   By: Oneil Devonshire M.D.   On: 05/24/2024 21:18     Procedures   Medications Ordered in the ED - No data to display                                  Medical Decision Making Patient complains of having had some bleeding yesterday.  Patient is early pregnant  Amount and/or Complexity of Data Reviewed Labs: ordered. Decision-making details documented in ED Course.    Details: Labs ordered reviewed and interpreted.  hCG is 132,666 Radiology: ordered and independent interpretation performed. Decision-making details documented in ED Course.    Details: Ultrasound shows a left ovarian simple cyst and a right adnexal hyper anechoic area which is probably hemorrhagic cyst.  Patient has a 8  week 6-day IUP.  Risk OTC drugs. Risk Details: Patient is counseled on ovarian cyst.  She is advised of the need for follow-up.  Patient is advised to schedule to see the OB/GYN for recheck.  Patient is advised to follow-up at [facility] if any problems.  She is given a prescription for prenatal vitamins.  Patient is discharged in stable condition.  28 as to be recognized labs ordered reviewed and interpreted      Final diagnoses:  [redacted] weeks gestation of pregnancy    ED Discharge Orders          Ordered    Prenatal Vit-Fe Fumarate-FA (PRENATAL VITAMINS) 28-0.8 MG TABS        05/24/24 2152            An After Visit Summary was printed and given to the patient.    Flint Sonny MARLA DEVONNA 05/24/24 2204    Dreama Longs, MD 05/25/24 [phone]

## 2024-06-21 ENCOUNTER — Emergency Department (HOSPITAL_COMMUNITY)
Admission: EM | Admit: 2024-06-21 | Discharge: 2024-06-21 | Disposition: A | Discharge status: Home / Self Care | Payer: Self-pay | Attending: Emergency Medicine | Admitting: Emergency Medicine

## 2024-06-21 ENCOUNTER — Encounter (HOSPITAL_COMMUNITY): Payer: Self-pay | Admitting: Emergency Medicine

## 2024-06-21 DIAGNOSIS — Z3A11 11 weeks gestation of pregnancy: Secondary | ICD-10-CM | POA: Insufficient documentation

## 2024-06-21 DIAGNOSIS — K5900 Constipation, unspecified: Secondary | ICD-10-CM | POA: Insufficient documentation

## 2024-06-21 DIAGNOSIS — O99611 Diseases of the digestive system complicating pregnancy, first trimester: Secondary | ICD-10-CM | POA: Insufficient documentation

## 2024-06-21 DIAGNOSIS — K625 Hemorrhage of anus and rectum: Secondary | ICD-10-CM | POA: Insufficient documentation

## 2024-06-21 LAB — COMPREHENSIVE METABOLIC PANEL WITH GFR
ALT: 24 U/L (ref 0–44)
AST: 21 U/L (ref 15–41)
Albumin: 4.3 g/dL (ref 3.5–5.0)
Alkaline Phosphatase: 78 U/L (ref 38–126)
Anion gap: 12 (ref 5–15)
BUN: <5 mg/dL — ABNORMAL LOW (ref 6–20)
CO2: 24 mmol/L (ref 22–32)
Calcium: 9.8 mg/dL (ref 8.9–10.3)
Chloride: 100 mmol/L (ref 98–111)
Creatinine, Ser: 0.51 mg/dL (ref 0.44–1.00)
GFR, Estimated: >60 mL/min (ref >60)
Glucose, Bld: 121 mg/dL — ABNORMAL HIGH (ref 70–99)
Potassium: 4 mmol/L (ref 3.5–5.1)
Sodium: 135 mmol/L (ref 135–145)
Total Bilirubin: 0.4 mg/dL (ref 0.0–1.2)
Total Protein: 7.6 g/dL (ref 6.5–8.1)

## 2024-06-21 LAB — CBC
HCT: 38 % (ref 36.0–46.0)
Hemoglobin: 11.6 g/dL — ABNORMAL LOW (ref 12.0–15.0)
MCH: 25.8 pg — ABNORMAL LOW (ref 26.0–34.0)
MCHC: 30.5 g/dL (ref 30.0–36.0)
MCV: 84.6 fL (ref 80.0–100.0)
Platelets: 336 K/uL (ref 150–400)
RBC: 4.49 MIL/uL (ref 3.87–5.11)
RDW: 13.2 % (ref 11.5–15.5)
WBC: 6.6 K/uL (ref 4.0–10.5)
nRBC: 0 % (ref 0.0–0.2)

## 2024-06-21 LAB — TYPE AND SCREEN
ABO/RH(D): O POS
Antibody Screen: POSITIVE

## 2024-06-21 LAB — HCG, SERUM, QUALITATIVE: Preg, Serum: POSITIVE — AB

## 2024-06-21 NOTE — ED Triage Notes (Signed)
 Patient in today reporting that she has had blood in stools that she noticed this morning. [redacted] weeks pregnant.

## 2024-06-21 NOTE — Discharge Instructions (Addendum)
 Today you were seen for rectal bleeding, I suspect this is likely due to constipation.  Please try to modify your diet and follow-up with your OB/GYN about taking daily MiraLAX  for mild to moderate constipation.  Please return to the ED if you have dizziness, worsening symptoms, or uncontrollable vomiting.  Thank you for letting us  treat you today. After reviewing your labs, I feel you are safe to go home. Please follow up with your PCP in the next several days and provide them with your records from this visit. Return to the Emergency Room if pain becomes severe or symptoms worsen.

## 2024-06-21 NOTE — ED Provider Notes (Signed)
 [place] EMERGENCY DEPARTMENT AT [facility] Provider Note   CSN: [id number] Arrival date & time: 06/21/24  1148     Patient presents with: Rectal Bleeding   Julie [name] is a 28 y.o. female currently [redacted] weeks pregnant presents today for bright red blood in stool yesterday.  Patient reports feeling constipated and having a hard stool which resulted in bright red blood that made the water slightly pink and without clots.  Patient denies fever, chills, nausea, vomiting, shortness of breath, dizziness, rectal pain, any other complaints at this time.    Rectal Bleeding      Prior to Admission medications   Medication Sig Start Date End Date Taking? Authorizing Provider  acetaminophen  (TYLENOL ) 500 MG tablet Take 1,000 mg by mouth every 6 (six) hours as needed for moderate pain.    [provider]  cephALEXin  (KEFLEX ) 500 MG capsule One tablet po tid 01/08/24   Sofia, Leslie K, PA-C  Prenatal Vit-Fe Fumarate-FA (PRENATAL VITAMINS) 28-0.8 MG TABS One tablet a day 05/24/24   Sofia, Leslie K, PA-C    Allergies: Citric acid     Review of Systems  Gastrointestinal:  Positive for blood in stool and hematochezia.    Updated Vital Signs BP 111/67   Pulse 98   Temp 98.1 F (36.7 C) (Oral)   Resp 16   LMP 12/26/2023 (Approximate)   SpO2 100%   Physical Exam Vitals and nursing note reviewed. Exam conducted with a chaperone present.  Constitutional:      General: She is not in acute distress.    Appearance: She is well-developed. She is not ill-appearing.  HENT:     Head: Normocephalic and atraumatic.     Nose: Nose normal.     Mouth/Throat:     Mouth: Mucous membranes are moist.  Eyes:     Extraocular Movements: Extraocular movements intact.     Conjunctiva/sclera: Conjunctivae normal.  Cardiovascular:     Rate and Rhythm: Normal rate and regular rhythm.     Pulses: Normal pulses.     Heart sounds: Normal heart sounds. No murmur heard.    Comments:  Borderline tachycardic Pulmonary:     Effort: Pulmonary effort is normal. No respiratory distress.     Breath sounds: Normal breath sounds.  Abdominal:     Palpations: Abdomen is soft.     Tenderness: There is no abdominal tenderness.  Genitourinary:    Rectum: No external hemorrhoid.     Comments: 2 small skin tags noted around the rectum, no fissure or active bleeding on exam. Musculoskeletal:        General: No swelling.     Cervical back: Neck supple.  Skin:    General: Skin is warm and dry.     Capillary Refill: Capillary refill takes less than 2 seconds.  Neurological:     General: No focal deficit present.     Mental Status: She is alert and oriented to person, place, and time.  Psychiatric:        Mood and Affect: Mood normal.     (all labs ordered are listed, but only abnormal results are displayed) Labs Reviewed  COMPREHENSIVE METABOLIC PANEL WITH GFR - Abnormal; Notable for the following components:      Result Value   Glucose, Bld 121 (*)    BUN <5 (*)    All other components within normal limits  CBC - Abnormal; Notable for the following components:   Hemoglobin 11.6 (*)    [facility]  25.8 (*)    All other components within normal limits  HCG, SERUM, QUALITATIVE - Abnormal; Notable for the following components:   Preg, Serum POSITIVE (*)    All other components within normal limits  POC OCCULT BLOOD, ED  TYPE AND SCREEN    EKG: None  Radiology: No results found.   Procedures   Medications Ordered in the ED - No data to display                                  Medical Decision Making Amount and/or Complexity of Data Reviewed Labs: ordered.   This patient presents to the ED for concern of blood in stool differential diagnosis includes hemorrhoid, anal fissure, constipation, lower GI bleed, malignancy   Lab Tests:  I Ordered, and personally interpreted labs.  The pertinent results include: Mild anemia at 11.6 which is around patient's baseline,  decreased bun at less than 5, positive pregnancy, blood type O+  Problem List / ED Course:  Considered for admission or further workup however patient's vital signs, physical exam, and labs are reassuring.  Patient's symptoms likely due to mild to moderate constipation.  Patient is hemodynamically stable.  Patient given information on dietary changes to help with mild to moderate constipation and advised to follow-up with her OB/GYN regarding implementing MiraLAX  while pregnant.  Patient given return precautions.  I feel patient is safe for discharge at this time.     Final diagnoses:  Constipation, unspecified constipation type  Rectal bleeding    ED Discharge Orders     None          Julie [name] 06/21/24 1626    Julie Maude BROCKS, MD 06/21/24 2212

## 2024-07-31 ENCOUNTER — Telehealth: Payer: Self-pay

## 2024-07-31 NOTE — Telephone Encounter (Signed)
 Called Pt to start My Chart Intake visit, no answer and no VM box set up to leave a message.

## 2024-08-09 ENCOUNTER — Encounter: Payer: Self-pay | Admitting: Obstetrics and Gynecology

## 2024-08-21 ENCOUNTER — Telehealth: Payer: Self-pay

## 2024-08-21 VITALS — Ht 65.0 in

## 2024-08-21 DIAGNOSIS — Z3480 Encounter for supervision of other normal pregnancy, unspecified trimester: Secondary | ICD-10-CM

## 2024-08-21 DIAGNOSIS — Z8751 Personal history of pre-term labor: Secondary | ICD-10-CM | POA: Insufficient documentation

## 2024-08-21 DIAGNOSIS — Z3482 Encounter for supervision of other normal pregnancy, second trimester: Secondary | ICD-10-CM

## 2024-08-21 DIAGNOSIS — Z3A21 21 weeks gestation of pregnancy: Secondary | ICD-10-CM

## 2024-08-21 DIAGNOSIS — Z3490 Encounter for supervision of normal pregnancy, unspecified, unspecified trimester: Secondary | ICD-10-CM | POA: Insufficient documentation

## 2024-08-21 MED ORDER — PRENATAL PLUS VITAMIN/MINERAL 27-1 MG PO TABS: 1.0000 | ORAL_TABLET | Freq: Every day | ORAL | 11 refills | Status: AC

## 2024-08-21 NOTE — Progress Notes (Signed)
 New OB Intake  I connected with Julie [name]  on 08/21/24 at  1:15 PM EST by MyChart Video Visit and verified that I am speaking with the correct person using two identifiers. Nurse is located at [facility] and pt is located at Home.  I discussed the limitations, risks, security and privacy concerns of performing an evaluation and management service by telephone and the availability of in person appointments. I also discussed with the patient that there may be a patient responsible charge related to this service. The patient expressed understanding and agreed to proceed.  I explained I am completing New OB Intake today. We discussed EDD of 12/31/2023 based on LMP of 03/25/24. Pt is [id number]. I reviewed her allergies, medications and Medical/Surgical/OB history.    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 Discussed the nature of our practice with multiple providers including residents and students as well as female and female providers. Due to the size of the practice, the delivering provider may not be the same as those providing prenatal care.   Patient is not interested in water birth.  MyChart/Babyscripts MyChart access verified. I explained pt will have some visits in office and some virtually. Babyscripts instructions given and order placed. Patient verifies receipt of registration text/e-mail. Account successfully created and app downloaded. If patient is a candidate for Optimized scheduling, add to sticky note.   Blood Pressure Cuff/Weight Scale Patient has private insurance; instructed to purchase blood pressure cuff and bring to first prenatal appt. Explained after first prenatal appt pt will check weekly and document in Babyscripts. Patient does not have weight scale; patient may  purchase if they desire to track weight weekly in Babyscripts.  Anatomy US  Explained first scheduled US  will be around 19 weeks. Anatomy US  scheduled for 09/17/24 at 10:00am.  Is patient a CenteringPregnancy candidate?  Declined Declined due to Group setting  Is patient a Mom+Baby Combined Care candidate?  Not a candidate     Is patient a candidate for Babyscripts Optimization? Yes, patient accepted    First visit review I reviewed new OB appt with patient. Explained pt will be seen by Jerilynn, NP at first visit 09/02/24 at 2:15pm. Discussed Natera genetic screening with patient. Desires [employer] and [employer].. Routine prenatal labs needed at [facility] OB visit.   Last Pap Diagnosis  Date Value Ref Range Status  05/22/2023   Final   - Negative for intraepithelial lesion or malignancy (NILM)    Waddell LITTIE Burows, RN 08/21/2024  1:38 PM

## 2024-08-21 NOTE — Patient Instructions (Signed)

## 2024-09-02 ENCOUNTER — Encounter: Payer: Self-pay | Admitting: Student

## 2024-09-03 ENCOUNTER — Telehealth: Payer: Self-pay | Admitting: Family Medicine

## 2024-09-03 NOTE — Telephone Encounter (Signed)
 I attempted to return pt call to both numbers listed in pt chart, no voicemail box was available.    Waddell, RN

## 2024-09-03 NOTE — Telephone Encounter (Signed)
 Patient messaged through mychart about rescheduling New OB appt. I rescheduled her for our next available appointment. Patient says she has questions about having shortness of breath, cold like symptoms and  abdominal pain.

## 2024-09-17 ENCOUNTER — Ambulatory Visit: Payer: Self-pay

## 2024-09-17 ENCOUNTER — Other Ambulatory Visit: Payer: Self-pay

## 2024-10-01 ENCOUNTER — Encounter: Payer: Self-pay | Admitting: Obstetrics and Gynecology
